# Patient Record
Sex: Female | Born: 1980
Health system: Southern US, Community
[De-identification: ages and names within clinical notes are randomized; demographics above are authoritative.]

## PROBLEM LIST (undated history)

## (undated) ENCOUNTER — Inpatient Hospital Stay (HOSPITAL_COMMUNITY): Payer: Self-pay

## (undated) ENCOUNTER — Inpatient Hospital Stay: Payer: Self-pay

## (undated) DIAGNOSIS — E049 Nontoxic goiter, unspecified: Secondary | ICD-10-CM

## (undated) DIAGNOSIS — IMO0002 Reserved for concepts with insufficient information to code with codable children: Secondary | ICD-10-CM

## (undated) DIAGNOSIS — D649 Anemia, unspecified: Secondary | ICD-10-CM

## (undated) DIAGNOSIS — D582 Other hemoglobinopathies: Secondary | ICD-10-CM

## (undated) HISTORY — PX: OTHER SURGICAL HISTORY: SHX169

## (undated) HISTORY — DX: Nontoxic goiter, unspecified: E04.9

## (undated) HISTORY — PX: UMBILICAL HERNIA REPAIR: SHX196

## (undated) HISTORY — PX: HERNIA REPAIR: SHX51

## (undated) HISTORY — DX: Reserved for concepts with insufficient information to code with codable children: IMO0002

## (undated) HISTORY — DX: Other hemoglobinopathies: D58.2

## (undated) HISTORY — PX: INDUCED ABORTION: SHX677

## (undated) HISTORY — DX: Anemia, unspecified: D64.9

---

## 2010-09-15 HISTORY — PX: CHOLECYSTECTOMY: SHX55

## 2011-11-24 ENCOUNTER — Emergency Department: Payer: Self-pay | Admitting: Emergency Medicine

## 2011-11-24 LAB — CBC
HCT: 26.9 % — ABNORMAL LOW (ref 35.0–47.0)
HGB: 8.5 g/dL — ABNORMAL LOW (ref 12.0–16.0)
MCH: 20.8 pg — ABNORMAL LOW (ref 26.0–34.0)
Platelet: 190 10*3/uL (ref 150–440)
RBC: 4.11 10*6/uL (ref 3.80–5.20)
RDW: 27.3 % — ABNORMAL HIGH (ref 11.5–14.5)
WBC: 4.7 10*3/uL (ref 3.6–11.0)

## 2011-11-24 LAB — URINALYSIS, COMPLETE
Bacteria: NONE SEEN
Glucose,UR: NEGATIVE mg/dL (ref 0–75)
Leukocyte Esterase: NEGATIVE
Nitrite: NEGATIVE
RBC,UR: 161 /HPF (ref 0–5)

## 2011-11-24 LAB — HCG, QUANTITATIVE, PREGNANCY: Beta Hcg, Quant.: 4 m[IU]/mL — ABNORMAL HIGH

## 2011-12-08 ENCOUNTER — Encounter: Payer: Self-pay | Admitting: Obstetrics & Gynecology

## 2011-12-08 ENCOUNTER — Ambulatory Visit (INDEPENDENT_AMBULATORY_CARE_PROVIDER_SITE_OTHER): Payer: Self-pay | Admitting: Obstetrics & Gynecology

## 2011-12-08 VITALS — BP 135/84 | HR 89 | Ht 68.0 in | Wt 154.0 lb

## 2011-12-08 DIAGNOSIS — Z0000 Encounter for general adult medical examination without abnormal findings: Secondary | ICD-10-CM

## 2011-12-08 DIAGNOSIS — Z124 Encounter for screening for malignant neoplasm of cervix: Secondary | ICD-10-CM

## 2011-12-08 DIAGNOSIS — Z113 Encounter for screening for infections with a predominantly sexual mode of transmission: Secondary | ICD-10-CM

## 2011-12-08 DIAGNOSIS — Z01419 Encounter for gynecological examination (general) (routine) without abnormal findings: Secondary | ICD-10-CM

## 2011-12-08 NOTE — Progress Notes (Signed)
Subjective:    Heidi Woods is a 31 y.o. female who presents for an annual exam. The patient has no complaints today. She never did miss any recent periods, but did have a faintly positive pregnancy test several weeks ago. She and her boyfriend would like to have a baby. The patient is sexually active. GYN screening history: last pap: was normal. The patient wears seatbelts: yes. The patient participates in regular exercise: yes. Has the patient ever been transfused or tattooed?: no. The patient reports that there is not domestic violence in her life.   Menstrual History: OB History    Grav Para Term Preterm Abortions TAB SAB Ect Mult Living   10 2   7     6       Menarche age: 26 Patient's last menstrual period was 10/23/2011.    The following portions of the patient's history were reviewed and updated as appropriate: allergies, current medications, past family history, past medical history, past social history, past surgical history and problem list.  Review of Systems Pertinent items are noted in HPI. She works for the [employer] with her mother. She moved here from [place], [place].   Objective:    BP 135/84  Pulse 89  Ht 5\' 8"  (1.727 m)  Wt 154 lb (69.854 kg)  BMI 23.42 kg/m2  LMP 10/23/2011  General Appearance:    Alert, cooperative, no distress, appears stated age  Head:    Normocephalic, without obvious abnormality, atraumatic  Eyes:    PERRL, conjunctiva/corneas clear, EOM's intact, fundi    benign, both eyes  Ears:    Normal TM's and external ear canals, both ears  Nose:   Nares normal, septum midline, mucosa normal, no drainage    or sinus tenderness  Throat:   Lips, mucosa, and tongue normal; teeth and gums normal  Neck:   Supple, symmetrical, trachea midline, no adenopathy;    thyroid:  no enlargement/tenderness/nodules; no carotid   bruit or JVD  Back:     Symmetric, no curvature, ROM normal, no CVA tenderness  Lungs:     Clear to auscultation bilaterally,  respirations unlabored  Chest Wall:    No tenderness or deformity   Heart:    Regular rate and rhythm, S1 and S2 normal, no murmur, rub   or gallop  Breast Exam:    No tenderness, masses, or nipple abnormality  Abdomen:     Soft, non-tender, bowel sounds active all four quadrants,    no masses, no organomegaly  Genitalia:    Normal female without lesion, discharge or tenderness  Rectal:    Normal tone, normal prostate, no masses or tenderness;   guaiac negative stool  Extremities:   Extremities normal, atraumatic, no cyanosis or edema  Pulses:   2+ and symmetric all extremities  Skin:   Skin color, texture, turgor normal, no rashes or lesions  Lymph nodes:   Cervical, supraclavicular, and axillary nodes normal  Neurologic:   CNII-XII intact, normal strength, sensation and reflexes    throughout  .    Assessment:    Healthy female exam.    Plan:     Pap smear.  She will start taking MVIs to help prevent ONTDs.

## 2013-01-22 ENCOUNTER — Emergency Department: Payer: Self-pay | Admitting: Emergency Medicine

## 2013-01-22 LAB — URINALYSIS, COMPLETE
Bilirubin,UR: NEGATIVE
Glucose,UR: NEGATIVE mg/dL (ref 0–75)
Ketone: NEGATIVE
Nitrite: NEGATIVE
Ph: 6 (ref 4.5–8.0)
Specific Gravity: 1.023 (ref 1.003–1.030)
WBC UR: 488 /HPF (ref 0–5)

## 2013-04-04 ENCOUNTER — Emergency Department: Payer: Self-pay | Admitting: Emergency Medicine

## 2013-04-04 LAB — BASIC METABOLIC PANEL
Anion Gap: 6 — ABNORMAL LOW (ref 7–16)
BUN: 11 mg/dL (ref 7–18)
Calcium, Total: 8.9 mg/dL (ref 8.5–10.1)
Creatinine: 0.66 mg/dL (ref 0.60–1.30)
EGFR (African American): >60
Glucose: 104 mg/dL — ABNORMAL HIGH (ref 65–99)
Potassium: 3.3 mmol/L — ABNORMAL LOW (ref 3.5–5.1)
Sodium: 136 mmol/L (ref 136–145)

## 2013-04-04 LAB — URINALYSIS, COMPLETE
Nitrite: NEGATIVE
Ph: 5 (ref 4.5–8.0)
Protein: 30
RBC,UR: 3 /HPF (ref 0–5)
Squamous Epithelial: 18
WBC UR: 8 /HPF (ref 0–5)

## 2013-04-04 LAB — WET PREP, GENITAL

## 2013-04-04 LAB — HCG, QUANTITATIVE, PREGNANCY: Beta Hcg, Quant.: 25479 m[IU]/mL — ABNORMAL HIGH

## 2013-04-04 LAB — CBC
HCT: 24.7 % — ABNORMAL LOW (ref 35.0–47.0)
HGB: 8.5 g/dL — ABNORMAL LOW (ref 12.0–16.0)
MCH: 25.5 pg — ABNORMAL LOW (ref 26.0–34.0)
MCHC: 34.5 g/dL (ref 32.0–36.0)
Platelet: 157 10*3/uL (ref 150–440)
RBC: 3.35 10*6/uL — ABNORMAL LOW (ref 3.80–5.20)
RDW: 21.1 % — ABNORMAL HIGH (ref 11.5–14.5)
WBC: 6.7 10*3/uL (ref 3.6–11.0)

## 2013-04-04 LAB — GC/CHLAMYDIA PROBE AMP

## 2013-04-05 ENCOUNTER — Encounter: Payer: Self-pay | Admitting: Obstetrics & Gynecology

## 2013-04-05 ENCOUNTER — Ambulatory Visit (INDEPENDENT_AMBULATORY_CARE_PROVIDER_SITE_OTHER): Payer: Self-pay | Admitting: Obstetrics & Gynecology

## 2013-04-05 VITALS — BP 137/80 | Wt 159.0 lb

## 2013-04-05 DIAGNOSIS — Z124 Encounter for screening for malignant neoplasm of cervix: Secondary | ICD-10-CM

## 2013-04-05 DIAGNOSIS — IMO0002 Reserved for concepts with insufficient information to code with codable children: Secondary | ICD-10-CM

## 2013-04-05 DIAGNOSIS — O99012 Anemia complicating pregnancy, second trimester: Secondary | ICD-10-CM

## 2013-04-05 DIAGNOSIS — Z3492 Encounter for supervision of normal pregnancy, unspecified, second trimester: Secondary | ICD-10-CM

## 2013-04-05 DIAGNOSIS — Z1151 Encounter for screening for human papillomavirus (HPV): Secondary | ICD-10-CM

## 2013-04-05 DIAGNOSIS — O99019 Anemia complicating pregnancy, unspecified trimester: Secondary | ICD-10-CM

## 2013-04-05 DIAGNOSIS — Z3480 Encounter for supervision of other normal pregnancy, unspecified trimester: Secondary | ICD-10-CM

## 2013-04-05 DIAGNOSIS — Z113 Encounter for screening for infections with a predominantly sexual mode of transmission: Secondary | ICD-10-CM

## 2013-04-05 DIAGNOSIS — R87612 Low grade squamous intraepithelial lesion on cytologic smear of cervix (LGSIL): Secondary | ICD-10-CM

## 2013-04-05 DIAGNOSIS — Z3490 Encounter for supervision of normal pregnancy, unspecified, unspecified trimester: Secondary | ICD-10-CM | POA: Insufficient documentation

## 2013-04-05 HISTORY — DX: Low grade squamous intraepithelial lesion on cytologic smear of cervix (LGSIL): R87.612

## 2013-04-05 HISTORY — DX: Reserved for concepts with insufficient information to code with codable children: IMO0002

## 2013-04-05 MED ORDER — FERROUS SULFATE 325 (65 FE) MG PO TABS: 325.0000 mg | ORAL_TABLET | Freq: Two times a day (BID) | ORAL | Status: DC

## 2013-04-05 MED ORDER — DOCUSATE SODIUM 100 MG PO CAPS: 100.0000 mg | ORAL_CAPSULE | Freq: Two times a day (BID) | ORAL | Status: DC | PRN

## 2013-04-05 NOTE — Progress Notes (Signed)
P-103 

## 2013-04-05 NOTE — Patient Instructions (Signed)
Pregnancy - Second Trimester The second trimester of pregnancy (3 to 6 months) is a period of rapid growth for you and your baby. At the end of the sixth month, your baby is about 9 inches long and weighs 1 1/2 pounds. You will begin to feel the baby move between 18 and 20 weeks of the pregnancy. This is called quickening. Weight gain is faster. A clear fluid (colostrum) may leak out of your breasts. You may feel small contractions of the womb (uterus). This is known as false labor or Braxton-Hicks contractions. This is like a practice for labor when the baby is ready to be born. Usually, the problems with morning sickness have usually passed by the end of your first trimester. Some women develop small dark blotches (called cholasma, mask of pregnancy) on their face that usually goes away after the baby is born. Exposure to the sun makes the blotches worse. Acne may also develop in some pregnant women and pregnant women who have acne, may find that it goes away. PRENATAL EXAMS  Blood work may continue to be done during prenatal exams. These tests are done to check on your health and the probable health of your baby. Blood work is used to follow your blood levels (hemoglobin). Anemia (low hemoglobin) is common during pregnancy. Iron and vitamins are given to help prevent this. You will also be checked for diabetes between 24 and 28 weeks of the pregnancy. Some of the previous blood tests may be repeated.  The size of the uterus is measured during each visit. This is to make sure that the baby is continuing to grow properly according to the dates of the pregnancy.  Your blood pressure is checked every prenatal visit. This is to make sure you are not getting toxemia.  Your urine is checked to make sure you do not have an infection, diabetes or protein in the urine.  Your weight is checked often to make sure gains are happening at the suggested rate. This is to ensure that both you and your baby are  growing normally.  Sometimes, an ultrasound is performed to confirm the proper growth and development of the baby. This is a test which bounces harmless sound waves off the baby so your caregiver can more accurately determine due dates. Sometimes, a test is done on the amniotic fluid surrounding the baby. This test is called an amniocentesis. The amniotic fluid is obtained by sticking a needle into the belly (abdomen). This is done to check the chromosomes in instances where there is a concern about possible genetic problems with the baby. It is also sometimes done near the end of pregnancy if an early delivery is required. In this case, it is done to help make sure the baby's lungs are mature enough for the baby to live outside of the womb. CHANGES OCCURING IN THE SECOND TRIMESTER OF PREGNANCY Your body goes through many changes during pregnancy. They vary from person to person. Talk to your caregiver about changes you notice that you are concerned about.  During the second trimester, you will likely have an increase in your appetite. It is normal to have cravings for certain foods. This varies from person to person and pregnancy to pregnancy.  Your lower abdomen will begin to bulge.  You may have to urinate more often because the uterus and baby are pressing on your bladder. It is also common to get more bladder infections during pregnancy. You can help this by drinking lots of fluids   and emptying your bladder before and after intercourse.  You may begin to get stretch marks on your hips, abdomen, and breasts. These are normal changes in the body during pregnancy. There are no exercises or medicines to take that prevent this change.  You may begin to develop swollen and bulging veins (varicose veins) in your legs. Wearing support hose, elevating your feet for 15 minutes, 3 to 4 times a day and limiting salt in your diet helps lessen the problem.  Heartburn may develop as the uterus grows and  pushes up against the stomach. Antacids recommended by your caregiver helps with this problem. Also, eating smaller meals 4 to 5 times a day helps.  Constipation can be treated with a stool softener or adding bulk to your diet. Drinking lots of fluids, and eating vegetables, fruits, and whole grains are helpful.  Exercising is also helpful. If you have been very active up until your pregnancy, most of these activities can be continued during your pregnancy. If you have been less active, it is helpful to start an exercise program such as walking.  Hemorrhoids may develop at the end of the second trimester. Warm sitz baths and hemorrhoid cream recommended by your caregiver helps hemorrhoid problems.  Backaches may develop during this time of your pregnancy. Avoid heavy lifting, wear low heal shoes, and practice good posture to help with backache problems.  Some pregnant women develop tingling and numbness of their hand and fingers because of swelling and tightening of ligaments in the wrist (carpel tunnel syndrome). This goes away after the baby is born.  As your breasts enlarge, you may have to get a bigger bra. Get a comfortable, cotton, support bra. Do not get a nursing bra until the last month of the pregnancy if you will be nursing the baby.  You may get a dark line from your belly button to the pubic area called the linea nigra.  You may develop rosy cheeks because of increase blood flow to the face.  You may develop spider looking lines of the face, neck, arms, and chest. These go away after the baby is born. HOME CARE INSTRUCTIONS   It is extremely important to avoid all smoking, herbs, alcohol, and unprescribed drugs during your pregnancy. These chemicals affect the formation and growth of the baby. Avoid these chemicals throughout the pregnancy to ensure the delivery of a healthy infant.  Most of your home care instructions are the same as suggested for the first trimester of your  pregnancy. Keep your caregiver's appointments. Follow your caregiver's instructions regarding medicine use, exercise, and diet.  During pregnancy, you are providing food for you and your baby. Continue to eat regular, well-balanced meals. Choose foods such as meat, fish, milk and other low fat dairy products, vegetables, fruits, and whole-grain breads and cereals. Your caregiver will tell you of the ideal weight gain.  A physical sexual relationship may be continued up until near the end of pregnancy if there are no other problems. Problems could include early (premature) leaking of amniotic fluid from the membranes, vaginal bleeding, abdominal pain, or other medical or pregnancy problems.  Exercise regularly if there are no restrictions. Check with your caregiver if you are unsure of the safety of some of your exercises. The greatest weight gain will occur in the last 2 trimesters of pregnancy. Exercise will help you:  Control your weight.  Get you in shape for labor and delivery.  Lose weight after you have the baby.  Wear   a good support or jogging bra for breast tenderness during pregnancy. This may help if worn during sleep. Pads or tissues may be used in the bra if you are leaking colostrum.  Do not use hot tubs, steam rooms or saunas throughout the pregnancy.  Wear your seat belt at all times when driving. This protects you and your baby if you are in an accident.  Avoid raw meat, uncooked cheese, cat litter boxes, and soil used by cats. These carry germs that can cause birth defects in the baby.  The second trimester is also a good time to visit your dentist for your dental health if this has not been done yet. Getting your teeth cleaned is okay. Use a soft toothbrush. Brush gently during pregnancy.  It is easier to leak urine during pregnancy. Tightening up and strengthening the pelvic muscles will help with this problem. Practice stopping your urination while you are going to the  bathroom. These are the same muscles you need to strengthen. It is also the muscles you would use as if you were trying to stop from passing gas. You can practice tightening these muscles up 10 times a set and repeating this about 3 times per day. Once you know what muscles to tighten up, do not perform these exercises during urination. It is more likely to contribute to an infection by backing up the urine.  Ask for help if you have financial, counseling, or nutritional needs during pregnancy. Your caregiver will be able to offer counseling for these needs as well as refer you for other special needs.  Your skin may become oily. If so, wash your face with mild soap, use non-greasy moisturizer and oil or cream based makeup. MEDICINES AND DRUG USE IN PREGNANCY  Take prenatal vitamins as directed. The vitamin should contain 1 milligram of folic acid. Keep all vitamins out of reach of children. Only a couple vitamins or tablets containing iron may be fatal to a baby or young child when ingested.  Avoid use of all medicines, including herbs, over-the-counter medicines, not prescribed or suggested by your caregiver. Only take over-the-counter or prescription medicines for pain, discomfort, or fever as directed by your caregiver. Do not use aspirin.  Let your caregiver also know about herbs you may be using.  Alcohol is related to a number of birth defects. This includes fetal alcohol syndrome. All alcohol, in any form, should be avoided completely. Smoking will cause low birth rate and premature babies.  Street or illegal drugs are very harmful to the baby. They are absolutely forbidden. A baby born to an addicted mother will be addicted at birth. The baby will go through the same withdrawal an adult does. SEEK MEDICAL CARE IF:  You have any concerns or worries during your pregnancy. It is better to call with your questions if you feel they cannot wait, rather than worry about them. SEEK IMMEDIATE  MEDICAL CARE IF:   An unexplained oral temperature above 102 F (38.9 C) develops, or as your caregiver suggests.  You have leaking of fluid from the vagina (birth canal). If leaking membranes are suspected, take your temperature and tell your caregiver of this when you call.  There is vaginal spotting, bleeding, or passing clots. Tell your caregiver of the amount and how many pads are used. Light spotting in pregnancy is common, especially following intercourse.  You develop a bad smelling vaginal discharge with a change in the color from clear to white.  You continue to feel   sick to your stomach (nauseated) and have no relief from remedies suggested. You vomit blood or coffee ground-like materials.  You lose more than 2 pounds of weight or gain more than 2 pounds of weight over 1 week, or as suggested by your caregiver.  You notice swelling of your face, hands, feet, or legs.  You get exposed to German measles and have never had them.  You are exposed to fifth disease or chickenpox.  You develop belly (abdominal) pain. Round ligament discomfort is a common non-cancerous (benign) cause of abdominal pain in pregnancy. Your caregiver still must evaluate you.  You develop a bad headache that does not go away.  You develop fever, diarrhea, pain with urination, or shortness of breath.  You develop visual problems, blurry, or double vision.  You fall or are in a car accident or any kind of trauma.  There is mental or physical violence at home. Document Released: 08/26/2001 Document Revised: 05/26/2012 Document Reviewed: 02/28/2009 [employer] Patient Information 2014 [employer].  

## 2013-04-05 NOTE — Progress Notes (Signed)
Subjective:    Heidi Woods is a [id number] at [redacted]w[redacted]d being seen today for her first obstetrical visit.  Her obstetrical history is significant for seven TABs, two SAB, two full term pregnancies. Pregnancy history fully reviewed.  Patient was seen at [facility] last night for dizziness and vaginal discharge, her Hgb was 8.0, also had BV which is being treated. No other concerns.   Filed Vitals:   04/05/13 1526  BP: 137/80  Weight: 159 lb (72.122 kg)    HISTORY: OB History   Grav Para Term Preterm Abortions TAB SAB Ect Mult Living   12 2 2  0 9 7 2  0 0 2     # Outc Date GA Lbr Len/2nd Wgt Sex Del Anes PTL Lv   1 TRM 2004    F       2 TRM 2007    M       3 SAB            4 SAB            5 TAB            6 TAB            7 TAB            8 TAB            9 TAB            10 TAB            11 TAB            12 CUR              Past Medical History  Diagnosis Date  . Anemia   . Allergy     seasonal  . Abnormal Pap smear of cervix 2004   Past Surgical History  Procedure Laterality Date  . Cholecystectomy  2012  . Induced abortion      x 7   Family History  Problem Relation Age of Onset  . Sickle cell anemia Father   . Cancer Maternal Grandfather   . Cancer Paternal Grandfather     throat     Exam    Uterus:     Pelvic Exam:    Perineum: No Hemorrhoids, Normal Perineum   Vulva: normal   Vagina:  normal mucosa, normal discharge   Cervix: multiparous appearance   Adnexa: normal adnexa and no mass, fullness, tenderness   Bony Pelvis: gynecoid  System: Breast:  normal appearance, no masses or tenderness   Skin: normal coloration and turgor, no rashes   Neurologic: oriented, normal   Extremities: normal strength, tone, and muscle mass   HEENT PERRLA and extra ocular movement intact   Mouth/Teeth mucous membranes moist, pharynx normal without lesions and dental hygiene good   Neck supple and no masses   Cardiovascular: regular rate and rhythm   Respiratory:   appears well, vitals normal, no respiratory distress, acyanotic, normal RR, chest clear, no wheezing, crepitations, rhonchi, normal symmetric air entry   Abdomen: soft, non-tender; bowel sounds normal; no masses,  no organomegaly   Urinary: urethral meatus normal      Assessment:    Pregnancy: [id number] Patient Active Problem List   Diagnosis Date Noted  . Supervision of normal pregnancy 04/05/2013    Plan:   Initial labs drawn. Prenatal vitamins, iron supplements and Colace prescribed. Problem list reviewed and updated. Genetic Screening discussed Quad Screen:will be done at next visit. Ultrasound  discussed; fetal survey: ordered Follow up in 4 weeks.  Jaynie Collins, MD, FACOG Attending Obstetrician & Gynecologist Faculty Practice, [facility]

## 2013-04-07 LAB — OBSTETRIC PANEL
Basophils Relative: 0 % (ref 0–1)
Hemoglobin: 8.7 g/dL — ABNORMAL LOW (ref 12.0–15.0)
Hepatitis B Surface Ag: NEGATIVE
Lymphocytes Relative: 23 % (ref 12–46)
Lymphs Abs: 1.5 10*3/uL (ref 0.7–4.0)
Monocytes Relative: 10 % (ref 3–12)
Neutro Abs: 4.3 10*3/uL (ref 1.7–7.7)
Neutrophils Relative %: 66 % (ref 43–77)
RBC: 3.48 MIL/uL — ABNORMAL LOW (ref 3.87–5.11)
Rh Type: POSITIVE
Rubella: 6.13 Index — ABNORMAL HIGH (ref <0.90)
WBC: 6.5 10*3/uL (ref 4.0–10.5)

## 2013-04-07 LAB — CULTURE, OB URINE

## 2013-04-12 ENCOUNTER — Telehealth: Payer: Self-pay | Admitting: *Deleted

## 2013-04-12 ENCOUNTER — Encounter: Payer: Self-pay | Admitting: Obstetrics & Gynecology

## 2013-04-12 NOTE — Telephone Encounter (Signed)
Pt aware, colposcopy scheduled

## 2013-04-12 NOTE — Telephone Encounter (Signed)
Message copied by Grayland Ormond on Tue Apr 12, 2013  4:00 PM ------      Message from: Jaynie Collins A      Created: Tue Apr 12, 2013 11:26 AM       Please schedule patient for colposcopy for LGSIL pap; can be combined with her OB visit. Please call to inform patient of results and need for appointment.       ------

## 2013-05-02 ENCOUNTER — Ambulatory Visit (INDEPENDENT_AMBULATORY_CARE_PROVIDER_SITE_OTHER): Payer: Self-pay | Admitting: Obstetrics & Gynecology

## 2013-05-02 ENCOUNTER — Encounter: Payer: Self-pay | Admitting: Obstetrics & Gynecology

## 2013-05-02 ENCOUNTER — Ambulatory Visit (HOSPITAL_COMMUNITY): Payer: Self-pay

## 2013-05-02 VITALS — BP 120/76 | Wt 167.0 lb

## 2013-05-02 DIAGNOSIS — Z3480 Encounter for supervision of other normal pregnancy, unspecified trimester: Secondary | ICD-10-CM

## 2013-05-02 DIAGNOSIS — IMO0002 Reserved for concepts with insufficient information to code with codable children: Secondary | ICD-10-CM

## 2013-05-02 DIAGNOSIS — R6889 Other general symptoms and signs: Secondary | ICD-10-CM

## 2013-05-02 DIAGNOSIS — Z3492 Encounter for supervision of normal pregnancy, unspecified, second trimester: Secondary | ICD-10-CM

## 2013-05-02 NOTE — Patient Instructions (Signed)
Return to clinic for any obstetric concerns or go to MAU for evaluation  

## 2013-05-02 NOTE — Progress Notes (Signed)
P-102 

## 2013-05-02 NOTE — Progress Notes (Signed)
Colposcopy not able to be done today as the machine was not functional, will do at another visit.  Anatomy scan today.  No other complaints or concerns.  Routine obstetric precautions reviewed.  Desires BTS, will sign papers at 28 weeks.

## 2013-05-09 ENCOUNTER — Ambulatory Visit (HOSPITAL_COMMUNITY)
Admission: RE | Admit: 2013-05-09 | Discharge: 2013-05-09 | Disposition: A | Discharge status: Home / Self Care | Payer: Medicaid Other | Source: Ambulatory Visit | Attending: Obstetrics & Gynecology | Admitting: Obstetrics & Gynecology

## 2013-05-09 ENCOUNTER — Encounter: Payer: Self-pay | Admitting: Obstetrics & Gynecology

## 2013-05-09 DIAGNOSIS — O358XX0 Maternal care for other (suspected) fetal abnormality and damage, not applicable or unspecified: Secondary | ICD-10-CM | POA: Insufficient documentation

## 2013-05-09 DIAGNOSIS — Z3492 Encounter for supervision of normal pregnancy, unspecified, second trimester: Secondary | ICD-10-CM

## 2013-05-09 DIAGNOSIS — IMO0002 Reserved for concepts with insufficient information to code with codable children: Secondary | ICD-10-CM | POA: Insufficient documentation

## 2013-05-09 DIAGNOSIS — Z1389 Encounter for screening for other disorder: Secondary | ICD-10-CM | POA: Insufficient documentation

## 2013-05-09 DIAGNOSIS — Z363 Encounter for antenatal screening for malformations: Secondary | ICD-10-CM | POA: Insufficient documentation

## 2013-05-18 ENCOUNTER — Encounter (HOSPITAL_COMMUNITY): Payer: Self-pay | Admitting: *Deleted

## 2013-05-18 ENCOUNTER — Inpatient Hospital Stay (HOSPITAL_COMMUNITY)
Admission: AD | Admit: 2013-05-18 | Discharge: 2013-05-18 | Disposition: A | Discharge status: Home / Self Care | Payer: Medicaid Other | Source: Ambulatory Visit | Attending: Family Medicine | Admitting: Family Medicine

## 2013-05-18 DIAGNOSIS — M545 Low back pain, unspecified: Secondary | ICD-10-CM | POA: Insufficient documentation

## 2013-05-18 DIAGNOSIS — M5430 Sciatica, unspecified side: Secondary | ICD-10-CM

## 2013-05-18 DIAGNOSIS — Y92009 Unspecified place in unspecified non-institutional (private) residence as the place of occurrence of the external cause: Secondary | ICD-10-CM | POA: Insufficient documentation

## 2013-05-18 DIAGNOSIS — W19XXXA Unspecified fall, initial encounter: Secondary | ICD-10-CM | POA: Insufficient documentation

## 2013-05-18 DIAGNOSIS — O99891 Other specified diseases and conditions complicating pregnancy: Secondary | ICD-10-CM | POA: Insufficient documentation

## 2013-05-18 DIAGNOSIS — M5432 Sciatica, left side: Secondary | ICD-10-CM

## 2013-05-18 MED ORDER — IBUPROFEN 600 MG PO TABS
600.0000 mg | ORAL_TABLET | Freq: Once | ORAL | Status: AC
  Administered 2013-05-18: 600 mg via ORAL
  Filled 2013-05-18: qty 1

## 2013-05-18 MED ORDER — CYCLOBENZAPRINE HCL 10 MG PO TABS: 10.0000 mg | ORAL_TABLET | Freq: Three times a day (TID) | ORAL | Status: DC | PRN

## 2013-05-18 MED ORDER — HYDROCODONE-ACETAMINOPHEN 5-325 MG PO TABS: 1.0000 | ORAL_TABLET | Freq: Four times a day (QID) | ORAL | Status: DC | PRN

## 2013-05-18 NOTE — MAU Note (Signed)
Pain in low back,starts in left buttock and radiates up.  Larey Seat on this side about 4 wks ago.

## 2013-05-18 NOTE — Progress Notes (Signed)
Patient states she did not feel pain initially when she fell. Pain started a few days later, then went away. Now has returned and is much worse. Hurts to stand, sit, walk. Has not taken any pain medication. Has not tried heat or ice.

## 2013-05-18 NOTE — MAU Provider Note (Signed)
History     CSN: [id number]  Arrival date and time: 05/18/13 1646   First Provider Initiated Contact with Patient 05/18/13 1735      Chief Complaint  Patient presents with  . Back Pain   HPI This is a 32 y.o. female at [redacted]w[redacted]d who presents with c/o low back pain on left side (points to SI joint area) which radiates upward at times. Larey Seat on it a month ago Has continued to hurt, more lately. Has not tried anything.  No weakness in legs but does have pain.   RN Note: Patient states she did not feel pain initially when she fell. Pain started a few days later, then went away. Now has returned and is much worse. Hurts to stand, sit, walk. Has not taken any pain medication. Has not tried heat or ice  Pain in low back,starts in left buttock and radiates up. Larey Seat on this side about 4 wks ago.       OB History   Grav Para Term Preterm Abortions TAB SAB Ect Mult Living   12 2 2  0 9 7 2  0 0 2      Past Medical History  Diagnosis Date  . Anemia   . Allergy     seasonal  . Abnormal Pap smear of cervix 2004  . LGSIL (low grade squamous intraepithelial lesion) on Pap smear 04/05/2013    Past Surgical History  Procedure Laterality Date  . Cholecystectomy  2012  . Induced abortion      x 7    Family History  Problem Relation Age of Onset  . Sickle cell anemia Father   . Cancer Maternal Grandfather   . Cancer Paternal Grandfather     throat    History  Substance Use Topics  . Smoking status: Never Smoker   . Smokeless tobacco: Never Used  . Alcohol Use: No    Allergies: No Known Allergies  Prescriptions prior to admission  Medication Sig Dispense Refill  . docusate sodium (COLACE) 100 MG capsule Take 1 capsule (100 mg total) by mouth 2 (two) times daily as needed for constipation.  30 capsule  2  . ferrous sulfate (FERROUSUL) 325 (65 FE) MG tablet Take 1 tablet (325 mg total) by mouth 2 (two) times daily.  60 tablet  1  . Prenatal Vit-Fe Fumarate-FA (MULTIVITAMIN-PRENATAL)  27-0.8 MG TABS Take 1 tablet by mouth daily at 12 noon.        Review of Systems  Constitutional: Negative for fever and malaise/fatigue.  Gastrointestinal: Negative for nausea, vomiting and abdominal pain.  Musculoskeletal: Positive for back pain, joint pain and falls.  Neurological: Negative for dizziness, sensory change, focal weakness, weakness and headaches.   Physical Exam   Blood pressure 106/65, pulse 104, temperature 98.1 F (36.7 C), temperature source Oral, resp. rate 20, weight 78.019 kg (172 lb), last menstrual period 12/23/2012.  Physical Exam  Constitutional: She is oriented to person, place, and time. She appears well-developed and well-nourished. No distress.  Cardiovascular: Normal rate.   Respiratory: Effort normal.  GI: Soft. There is no tenderness. There is no rebound and no guarding.  Musculoskeletal: Normal range of motion. She exhibits tenderness (over left SI joint).  Negative straight leg raises bilaterally   Neurological: She is alert and oriented to person, place, and time.  Skin: Skin is warm and dry.  Psychiatric: She has a normal mood and affect.  NOrmal sensation bilaterally Dorsi and plantar flexion strong and equal bilaterally  MAU Course  Procedures  Assessment and Plan  A:  SIUP at [redacted]w[redacted]d        S/P fall to left buttock/hip       Worsening pain   P:  Advised probably needs ortho eval and probably MRI to evaluate      Rx limited vicodin and Flexeril.      Message sent to nurse at [facility]  [facility] 05/18/2013, 5:44 PM

## 2013-05-21 NOTE — MAU Provider Note (Signed)
Chart reviewed and agree with management and plan.  

## 2013-05-31 ENCOUNTER — Ambulatory Visit (INDEPENDENT_AMBULATORY_CARE_PROVIDER_SITE_OTHER): Payer: Medicaid Other | Admitting: Obstetrics & Gynecology

## 2013-05-31 VITALS — BP 129/71 | Wt 176.0 lb

## 2013-05-31 DIAGNOSIS — Z302 Encounter for sterilization: Secondary | ICD-10-CM | POA: Insufficient documentation

## 2013-05-31 DIAGNOSIS — O26899 Other specified pregnancy related conditions, unspecified trimester: Secondary | ICD-10-CM

## 2013-05-31 DIAGNOSIS — Z3480 Encounter for supervision of other normal pregnancy, unspecified trimester: Secondary | ICD-10-CM

## 2013-05-31 DIAGNOSIS — O9989 Other specified diseases and conditions complicating pregnancy, childbirth and the puerperium: Secondary | ICD-10-CM

## 2013-05-31 DIAGNOSIS — N949 Unspecified condition associated with female genital organs and menstrual cycle: Secondary | ICD-10-CM

## 2013-05-31 LAB — POCT URINALYSIS DIPSTICK
Nitrite, UA: NEGATIVE
Protein, UA: NEGATIVE
Urobilinogen, UA: NEGATIVE
pH, UA: 6

## 2013-05-31 NOTE — Progress Notes (Signed)
P = 98 

## 2013-05-31 NOTE — Progress Notes (Signed)
Anatomy scan showed marginal cord insertion, results discussed with patient and her mother today, will get another scan in third trimester.  Patient reports cramping over the last day, comes every hour, no dysuria or bleeding. UA negative, cervix was closed on exam.   Will continue to monitor. Counseled about flu and Tdap vaccines, patient will consider this and let us know by next visit.  1 hr GTT, labs also to be done next visit; colposcopy if available.  No other complaints or concerns.  Routine obstetric precautions reviewed.

## 2013-05-31 NOTE — Patient Instructions (Addendum)
Return to clinic for any obstetric concerns or go to [facility] for evaluation  Tetanus, Diphtheria (Td) or Tetanus, Diphtheria, Pertussis (Tdap) Vaccine What You Need to Know WHY GET VACCINATED? Tetanus, diphtheria and pertussis can be very serious diseases. TETANUS (Lockjaw) causes painful muscle spasms and stiffness, usually all over the body.  Tetanus can lead to tightening of muscles in the head and neck so the victim cannot open his mouth or swallow, or sometimes even breathe. Tetanus kills about 1 out of 5 people who are infected. DIPHTHERIA can cause a thick membrane to cover the back of the throat.  Diphtheria can lead to breathing problems, paralysis, heart failure, and even death. PERTUSSIS (Whooping Cough) causes severe coughing spells which can lead to difficulty breathing, vomiting, and disturbed sleep.  Pertussis can lead to weight loss, incontinence, rib fractures and passing out from violent coughing. Up to 2 in 100 adolescents and 5 in 100 adults with pertussis are hospitalized or have complications, including pneumonia and death. These 3 diseases are all caused by bacteria. Diphtheria and pertussis are spread from person to person. Tetanus enters the body through cuts, scratches, or wounds. The [place] saw as many as 200,000 cases a year of diphtheria and pertussis before vaccines were available, and hundreds of cases of tetanus. Since then, tetanus and diphtheria cases have dropped by about 99% and pertussis cases by about 92%. Children 44 years of age and younger get DTaP vaccine to protect them from these three diseases. But older children, adolescents, and adults need protection too. VACCINES FOR ADOLESCENTS AND ADULTS: TD AND TDAP Two vaccines are available to protect people 51 years of age and older from these diseases:  Td vaccine has been used for many years. It protects against tetanus and diphtheria.  Tdap vaccine was licensed in 2005. It is the first vaccine for  adolescents and adults that protects against pertussis as well as tetanus and diphtheria. A Td booster dose is recommended every 10 years. Tdap is given only once. WHICH VACCINE, AND WHEN? Ages 7 through 18 years  A dose of Tdap is recommended at age 17 or 87. This dose could be given as early as age 75 for children who missed one or more childhood doses of DTaP.  Children and adolescents who did not get a complete series of DTaP shots by age 73 should complete the series using a combination of Td and Tdap. Age 71 years and Older  All adults should get a booster dose of Td every 10 years. Adults under 65 who have never gotten Tdap should get a dose of Tdap as their next booster dose. Adults 65 and older may get one booster dose of Tdap.  Adults (including women who may become pregnant and adults 34 and older) who expect to have close contact with a baby younger than 78 months of age should get a dose of Tdap to help protect the baby from pertussis.  Healthcare professionals who have direct patient contact in hospitals or clinics should get one dose of Tdap. Protection After a Wound  A person who gets a severe cut or burn might need a dose of Td or Tdap to prevent tetanus infection. Tdap should be used for anyone who has never had a dose previously. Td should be used if Tdap is not available, or for:  Anybody who has already had a dose of Tdap.  Children 7 through 10 years of age who completed the childhood DTaP series.  Adults 65 and  older. Pregnant Women  Pregnant women who have never had a dose of Tdap should get one, after the 20th week of gestation and preferably during the 3rd trimester. If they do not get Tdap during their pregnancy they should get a dose as soon as possible after delivery. Pregnant women who have previously received Tdap and need tetanus or diphtheria vaccine while pregnant should get Td. Tdap and Td may be given at the same time as other vaccines. SOME PEOPLE SHOULD  NOT BE VACCINATED OR SHOULD WAIT  Anyone who has had a life-threatening allergic reaction after a dose of any tetanus, diphtheria, or pertussis containing vaccine should not get Td or Tdap.  Anyone who has a severe allergy to any component of a vaccine should not get that vaccine. Tell your doctor if the person getting the vaccine has any severe allergies.  Anyone who had a coma, or long or multiple seizures within 7 days after a dose of DTP or DTaP should not get Tdap, unless a cause other than the vaccine was found. These people may get Td.  Talk to your doctor if the person getting either vaccine:  Has epilepsy or another nervous system problem.  Had severe swelling or severe pain after a previous dose of DTP, DTaP, DT, Td, or Tdap vaccine.  Has had Guillain Barr Syndrome (GBS). Anyone who has a moderate or severe illness on the day the shot is scheduled should usually wait until they recover before getting Tdap or Td vaccine. A person with a mild illness or low fever can usually be vaccinated. WHAT ARE THE RISKS FROM TDAP AND TD VACCINES? With a vaccine, as with any medicine, there is always a small risk of a life-threatening allergic reaction or other serious problem. Brief fainting spells and related symptoms (such as jerking movements) can happen after any medical procedure, including vaccination. Sitting or lying down for about 15 minutes after a vaccination can help prevent fainting and injuries caused by falls. Tell your doctor if the patient feels dizzy or lightheaded, or has vision changes or ringing in the ears. Getting tetanus, diphtheria, or pertussis would be much more likely to lead to severe problems than getting either Td or Tdap vaccine. Problems reported after Td and Tdap vaccines are listed below. Mild Problems (noticeable, but did not interfere with activities) Tdap  Pain (about 3 in 4 adolescents and 2 in 3 adults).  Redness or swelling (about 1 in 5).  Mild fever  of at least 100.4 F (38 C) (up to about 1 in 25 adolescents and 1 in 100 adults).  Headache (about 4 in 10 adolescents and 3 in 10 adults).  Tiredness (about 1 in 3 adolescents and 1 in 4 adults).  Nausea, vomiting, diarrhea, or stomach ache (up to 1 in 4 adolescents and 1 in 10 adults).  Chills, body aches, sore joints, rash, or swollen glands (uncommon). Td  Pain (up to about 8 in 10).  Redness or swelling at the injection site (up to about 1 in 3).  Mild fever (up to about 1 in 15).  Headache or tiredness (uncommon). Moderate Problems (interfered with activities, but did not require medical attention) Tdap  Pain at the injection site (about 1 in 20 adolescents and 1 in 100 adults).  Redness or swelling at the injection site (up to about 1 in 16 adolescents and 1 in 25 adults).  Fever over 102 F (38.9 C) (about 1 in 100 adolescents and 1 in 250 adults).  Headache (1 in 300).  Nausea, vomiting, diarrhea, or stomach ache (up to 3 in 100 adolescents and 1 in 100 adults). Td  Fever over 102 F (38.9 C) (rare). Tdap or Td  Extensive swelling of the arm where the shot was given (up to about 3 in 100). Severe Problems (Unable to perform usual activities; required medical attention) Tdap or Td  Swelling, severe pain, bleeding, and redness in the arm where the shot was given (rare). A severe allergic reaction could occur after any vaccine. They are estimated to occur less than once in a million doses. WHAT IF THERE IS A SEVERE REACTION? What should I look for? Any unusual condition, such as a severe allergic reaction or a high fever. If a severe allergic reaction occurred, it would be within a few minutes to an hour after the shot. Signs of a serious allergic reaction can include difficulty breathing, weakness, hoarseness or wheezing, a fast heartbeat, hives, dizziness, paleness, or swelling of the throat. What should I do?  Call a doctor, or get the person to a doctor  right away.  Tell your doctor what happened, the date and time it happened, and when the vaccination was given.  Ask your provider to report the reaction by filing a Vaccine Adverse Event Reporting System (VAERS) form. Or, you can file this report through the VAERS website at www.vaers.LAgents.no or by calling 1-614-697-9311. VAERS does not provide medical advice. THE NATIONAL VACCINE INJURY COMPENSATION PROGRAM The National Vaccine Injury Compensation Program (VICP) was created in 1986. Persons who believe they may have been injured by a vaccine can learn about the program and about filing a claim by calling 1-209-501-6379 or visiting the VICP website at SpiritualWord.at. HOW CAN I LEARN MORE?  Your doctor can give you the vaccine package insert or suggest other sources of information.  Call your local or state health department.  Contact the Centers for Disease Control and Prevention (CDC):  Call 803-458-1513 (1-800-CDC-INFO).  Visit the [facility] website at PicCapture.uy. CDC Td and Tdap Interim VIS (10/08/10) Document Released: 06/29/2006 Document Revised: 11/24/2011 Document Reviewed: 10/08/2010 Exit[employer] Patient Information 2014 [place], [place]. Influenza Vaccine (Flu Vaccine, Inactivated) 2013 2014 What You Need to Know WHY GET VACCINATED?  Influenza ("flu") is a contagious disease that spreads around the [place] every winter, usually between October and May.  Flu is caused by the influenza virus, and can be spread by coughing, sneezing, and close contact.  Anyone can get flu, but the risk of getting flu is highest among children. Symptoms come on suddenly and may last several days. They can include:  Fever or chills.  Sore throat.  Muscle aches.  Fatigue.  Cough.  Headache.  Runny or stuffy nose. Flu can make some people much sicker than others. These people include young children, people 68 and older, pregnant women, and people with certain  health conditions such as heart, lung or kidney disease, or a weakened immune system. Flu vaccine is especially important for these people, and anyone in close contact with them. Flu can also lead to pneumonia, and make existing medical conditions worse. It can cause diarrhea and seizures in children. Each year thousands of people in the [place] die from flu, and many more are hospitalized. Flu vaccine is the best protection we have from flu and its complications. Flu vaccine also helps prevent spreading flu from person to person. INACTIVATED FLU VACCINE There are 2 types of influenza vaccine:  You are getting an inactivated flu vaccine,  which does not contain any live influenza virus. It is given by injection with a needle, and often called the "flu shot."  A different live, attenuated (weakened) influenza vaccine is sprayed into the nostrils. This vaccine is described in a separate Vaccine Information Statement. Flu vaccine is recommended every year. Children 6 months through 56 years of age should get 2 doses the first year they get vaccinated. Flu viruses are always changing. Each year's flu vaccine is made to protect from viruses that are most likely to cause disease that year. While flu vaccine cannot prevent all cases of flu, it is our best defense against the disease. Inactivated flu vaccine protects against 3 or 4 different influenza viruses. It takes about 2 weeks for protection to develop after the vaccination, and protection lasts several months to a year. Some illnesses that are not caused by influenza virus are often mistaken for flu. Flu vaccine will not prevent these illnesses. It can only prevent influenza. A "high-dose" flu vaccine is available for people 51 years of age and older. The person giving you the vaccine can tell you more about it. Some inactivated flu vaccine contains a very small amount of a mercury-based preservative called thimerosal. Studies have shown that  thimerosal in vaccines is not harmful, but flu vaccines that do not contain a preservative are available. SOME PEOPLE SHOULD NOT GET THIS VACCINE Tell the person who gives you the vaccine:  If you have any severe (life-threatening) allergies. If you ever had a life-threatening allergic reaction after a dose of flu vaccine, or have a severe allergy to any part of this vaccine, you may be advised not to get a dose. Most, but not all, types of flu vaccine contain a small amount of egg.  If you ever had Guillain Barr Syndrome (a severe paralyzing illness, also called GBS). Some people with a history of GBS should not get this vaccine. This should be discussed with your doctor.  If you are not feeling well. They might suggest waiting until you feel better. But you should come back. RISKS OF A VACCINE REACTION With a vaccine, like any medicine, there is a chance of side effects. These are usually mild and go away on their own. Serious side effects are also possible, but are very rare. Inactivated flu vaccine does not contain live flu virus, sogetting flu from this vaccine is not possible. Brief fainting spells and related symptoms (such as jerking movements) can happen after any medical procedure, including vaccination. Sitting or lying down for about 15 minutes after a vaccination can help prevent fainting and injuries caused by falls. Tell your doctor if you feel dizzy or lightheaded, or have vision changes or ringing in the ears. Mild problems following inactivated flu vaccine:  Soreness, redness, or swelling where the shot was given.  Hoarseness; sore, red or itchy eyes; or cough.  Fever.  Aches.  Headache.  Itching.  Fatigue. If these problems occur, they usually begin soon after the shot and last 1 or 2 days. Moderate problems following inactivated flu vaccine:  Young children who get inactivated flu vaccine and pneumococcal vaccine (PCV13) at the same time may be at increased risk  for seizures caused by fever. Ask your doctor for more information. Tell your doctor if a child who is getting flu vaccine has ever had a seizure. Severe problems following inactivated flu vaccine:  A severe allergic reaction could occur after any vaccine (estimated less than 1 in a million doses).  There is  a small possibility that inactivated flu vaccine could be associated with Guillan Barr Syndrome (GBS), no more than 1 or 2 cases per million people vaccinated. This is much lower than the risk of severe complications from flu, which can be prevented by flu vaccine. The safety of vaccines is always being monitored. For more information, visit: http://floyd.org/ WHAT IF THERE IS A SERIOUS REACTION? What should I look for?  Look for anything that concerns you, such as signs of a severe allergic reaction, very high fever, or behavior changes. Signs of a severe allergic reaction can include hives, swelling of the face and throat, difficulty breathing, a fast heartbeat, dizziness, and weakness. These would start a few minutes to a few hours after the vaccination. What should I do?  If you think it is a severe allergic reaction or other emergency that cannot wait, call [phone] 1 1  or get the person to the nearest hospital. Otherwise, call your doctor.  Afterward, the reaction should be reported to the Vaccine Adverse Event Reporting System (VAERS). Your doctor might file this report, or you can do it yourself through the VAERS website at www.vaers.LAgents.no, or by calling 1-917-518-1874. VAERS is only for reporting reactions. They do not give medical advice. THE NATIONAL VACCINE INJURY COMPENSATION PROGRAM The National Vaccine Injury Compensation Program (VICP) is a federal program that was created to compensate people who may have been injured by certain vaccines. Persons who believe they may have been injured by a vaccine can learn about the program and about filing a claim by calling  1-(774)544-8641 or visiting the VICP website at SpiritualWord.at HOW CAN I LEARN MORE?  Ask your doctor.  Call your local or state health department.  Contact the Centers for Disease Control and Prevention (CDC):  Call 6474919998 (1-800-CDC-INFO) or  Visit CDC's website at BiotechRoom.com.cy CDC Inactivated Influenza Vaccine Interim VIS (04/09/12) Document Released: 06/26/2006 Document Revised: 05/26/2012 Document Reviewed: 04/09/2012 [facility] Patient Information 2014 [place], [place].

## 2013-06-27 ENCOUNTER — Ambulatory Visit (INDEPENDENT_AMBULATORY_CARE_PROVIDER_SITE_OTHER): Payer: Medicaid Other | Admitting: Obstetrics & Gynecology

## 2013-06-27 ENCOUNTER — Encounter: Payer: Self-pay | Admitting: Obstetrics & Gynecology

## 2013-06-27 VITALS — BP 100/68 | Wt 178.0 lb

## 2013-06-27 DIAGNOSIS — Z23 Encounter for immunization: Secondary | ICD-10-CM

## 2013-06-27 DIAGNOSIS — Z3490 Encounter for supervision of normal pregnancy, unspecified, unspecified trimester: Secondary | ICD-10-CM

## 2013-06-27 DIAGNOSIS — Z3480 Encounter for supervision of other normal pregnancy, unspecified trimester: Secondary | ICD-10-CM

## 2013-06-27 NOTE — Progress Notes (Signed)
Routine visit. Good FM. She wants the flu and TDAP vaccines today. Also glucola and labs. She will get her colpo at the gyn clinic or [place] as we don't have a colposcope at the present time.

## 2013-06-27 NOTE — Patient Instructions (Signed)
Colposcopy Colposcopy is a procedure that uses a special lighted microscope (colposcope). It examines your cervix and vagina, or the area around the outside of the vagina, for signs of disease or abnormalities in the cells. You may be sent to a specialist (gynecologist) to do the colposcopy. A biopsy (tissue sample) may be collected during a colposcopy, if the caregiver finds any unusual cells. The biopsy is sent to the lab for further testing, and the results are reported back to your caregiver. A WOMAN MAY NEED THIS PROCEDURE IF:  She has had an abnormal pap smear (taking cells from the cervix for testing).  She has a sore on her cervix, and a Pap test was normal.  The Pap test suggests human papilloma virus (HPV). This virus can cause genital warts and is linked to the development of cervical cancer.  She has genital warts on the cervix, or in or around the outside of the vagina.  Her mother took the drug DES while pregnant.  She has painful intercourse.  She has vaginal bleeding, especially after sexual intercourse.  There is a need to evaluate the results of previous treatment. BEFORE THE PROCEDURE   Colposcopy is done when you are not having a menstrual period.  For 24 hours before the colposcopy, do not:  Douche.  Use tampons.  Use medicines, creams, or suppositories in the vagina.  Have sexual intercourse. PROCEDURE   A colposcopy is done while a woman is lying on her back with her feet in foot rests (stirrups).  A speculum is placed inside the vagina to keep it open and to allow the caregiver to see the cervix. This is the same instrument used to do a pap smear.  The colposcope is placed outside the vagina. It is used to magnify and examine the cervix, vagina, and the area around the outside of the vagina.  A small amount of liquid solution is placed on the area that is to be viewed. This solution is placed on with a cotton applicator. This solution makes it easier to  see the abnormal cells.  Your caregiver will suck out mucus and cells from the canal of the cervix.  Small pieces of tissue for biopsy may be taken at the same time. You may feel mild pain or discomfort when this is done.  Your caregiver will record the location of the abnormal areas and send the tissue samples to a lab for analysis.  If your caregiver biopsies the vagina or outside of the vagina, a local anesthetic (novocaine) is usually given. AFTER THE PROCEDURE   You may have some cramping that often goes away in a few minutes. You may have some soreness for a couple of days.  You may take over-the-counter pain medicine as advised by your caregiver. Do not take aspirin because it can cause bleeding.  Lie down for a few minutes if you feel lightheaded.  You may have some bleeding or dark discharge that should stop in a few days.  You may need to wear a sanitary pad for a few days. HOME CARE INSTRUCTIONS   Avoid sex, douching, and using tampons for a week or as directed.  Only take medicine as directed by your caregiver.  Continue to take birth control pills, if you are on them.  Not all test results are available during your visit. If your test results are not back during the visit, make an appointment with your caregiver to find out the results. Do not assume everything is   normal if you have not heard from your caregiver or the medical facility. It is important for you to follow up on all of your test results.  Follow your caregiver's advice regarding medicines, activity, follow-up visits, and follow-up Pap tests. SEEK MEDICAL CARE IF:   You develop a rash.  You have problems with your medicine. SEEK IMMEDIATE MEDICAL CARE IF:  You are bleeding heavily or are passing blood clots.  You develop a fever over 102 F (38.9 C), with or without chills.  You have abnormal vaginal discharge.  You are having cramps that do not go away after taking your pain medicine.  You  feel lightheaded, dizzy, or faint.  You develop stomach pain. Document Released: 11/22/2002 Document Revised: 11/24/2011 Document Reviewed: 07/05/2009 [employer] Patient Information 2014 [employer].  

## 2013-06-27 NOTE — Progress Notes (Signed)
P= [phone]  

## 2013-06-28 LAB — CBC
HCT: 22.7 % — ABNORMAL LOW (ref 36.0–46.0)
MCHC: 33.9 g/dL (ref 30.0–36.0)
MCV: 68.2 fL — ABNORMAL LOW (ref 78.0–100.0)
RDW: 20.2 % — ABNORMAL HIGH (ref 11.5–15.5)
WBC: 7.9 10*3/uL (ref 4.0–10.5)

## 2013-07-07 ENCOUNTER — Encounter: Payer: Self-pay | Admitting: Obstetrics & Gynecology

## 2013-07-19 ENCOUNTER — Encounter: Payer: Self-pay | Admitting: Family Medicine

## 2013-07-19 ENCOUNTER — Ambulatory Visit (INDEPENDENT_AMBULATORY_CARE_PROVIDER_SITE_OTHER): Payer: Medicaid Other | Admitting: Family Medicine

## 2013-07-19 VITALS — BP 113/72 | Wt 181.0 lb

## 2013-07-19 DIAGNOSIS — Z302 Encounter for sterilization: Secondary | ICD-10-CM

## 2013-07-19 DIAGNOSIS — Z3493 Encounter for supervision of normal pregnancy, unspecified, third trimester: Secondary | ICD-10-CM

## 2013-07-19 DIAGNOSIS — B3731 Acute candidiasis of vulva and vagina: Secondary | ICD-10-CM

## 2013-07-19 DIAGNOSIS — Z3480 Encounter for supervision of other normal pregnancy, unspecified trimester: Secondary | ICD-10-CM

## 2013-07-19 DIAGNOSIS — IMO0002 Reserved for concepts with insufficient information to code with codable children: Secondary | ICD-10-CM

## 2013-07-19 DIAGNOSIS — B373 Candidiasis of vulva and vagina: Secondary | ICD-10-CM

## 2013-07-19 MED ORDER — PRENATAL 27-0.8 MG PO TABS: 1.0000 | ORAL_TABLET | Freq: Every day | ORAL | Status: DC

## 2013-07-19 MED ORDER — FLUCONAZOLE 150 MG PO TABS: 150.0000 mg | ORAL_TABLET | Freq: Every day | ORAL | Status: DC

## 2013-07-19 NOTE — Progress Notes (Signed)
P-103 

## 2013-07-19 NOTE — Patient Instructions (Addendum)
Having a circumcision done in the hospital costs approximately $480.  This will have to be paid in full prior to circumcision being performed.  There are places to have circumcision done as an outpatient which are cheaper.    Circumcisions      Provider   Phone    Price     ------------------------------------------------------------------------------   [facility]  [phone] in hospital and up to 4 wks     [facility] [place] [phone]  $[phone] by 4 wks     [facility] [phone]  $175 by 2 wks    [facility]   [phone]  $250 by 7 days  Pregnancy - Third Trimester The third trimester of pregnancy (the last 3 months) is a period of the most rapid growth for you and your baby. The baby approaches a length of 20 inches and a weight of 6 to 10 pounds. The baby is adding on fat and getting ready for life outside your body. While inside, babies have periods of sleeping and waking, sucking thumbs, and hiccuping. You can often feel small contractions of the uterus. This is false labor. It is also called Braxton-Hicks contractions. This is like a practice for labor. The usual problems in this stage of pregnancy include more difficulty breathing, swelling of the hands and feet from water retention, and having to urinate more often because of the uterus and baby pressing on your bladder.  PRENATAL EXAMS  Blood work may continue to be done during prenatal exams. These tests are done to check on your health and the probable health of your baby. Blood work is used to follow your blood levels (hemoglobin). Anemia (low hemoglobin) is common during pregnancy. Iron and vitamins are given to help prevent this. You may also continue to be checked for diabetes. Some of the past blood tests may be done again.  The size of the uterus is measured during each visit. This makes sure your baby is growing properly according to your pregnancy dates.  Your blood pressure is checked every prenatal visit. This is to  make sure you are not getting toxemia.  Your urine is checked every prenatal visit for infection, diabetes, and protein.  Your weight is checked at each visit. This is done to make sure gains are happening at the suggested rate and that you and your baby are growing normally.  Sometimes, an ultrasound is performed to confirm the position and the proper growth and development of the baby. This is a test done that bounces harmless sound waves off the baby so your caregiver can more accurately determine a due date.  Discuss the type of pain medicine and anesthesia you will have during your labor and delivery.  Discuss the possibility and anesthesia if a cesarean section might be necessary.  Inform your caregiver if there is any mental or physical violence at home. Sometimes, a specialized non-stress test, contraction stress test, and biophysical profile are done to make sure the baby is not having a problem. Checking the amniotic fluid surrounding the baby is called an amniocentesis. The amniotic fluid is removed by sticking a needle into the belly (abdomen). This is sometimes done near the end of pregnancy if an early delivery is required. In this case, it is done to help make sure the baby's lungs are mature enough for the baby to live outside of the womb. If the lungs are not mature and it is unsafe to deliver the baby, an injection of cortisone medicine  is given to the mother 1 to 2 days before the delivery. This helps the baby's lungs mature and makes it safer to deliver the baby. CHANGES OCCURING IN THE THIRD TRIMESTER OF PREGNANCY Your body goes through many changes during pregnancy. They vary from person to person. Talk to your caregiver about changes you notice and are concerned about.  During the last trimester, you have probably had an increase in your appetite. It is normal to have cravings for certain foods. This varies from person to person and pregnancy to pregnancy.  You may begin to  get stretch marks on your hips, abdomen, and breasts. These are normal changes in the body during pregnancy. There are no exercises or medicines to take which prevent this change.  Constipation may be treated with a stool softener or adding bulk to your diet. Drinking lots of fluids, fiber in vegetables, fruits, and whole grains are helpful.  Exercising is also helpful. If you have been very active up until your pregnancy, most of these activities can be continued during your pregnancy. If you have been less active, it is helpful to start an exercise program such as walking. Consult your caregiver before starting exercise programs.  Avoid all smoking, alcohol, non-prescribed drugs, herbs and "street drugs" during your pregnancy. These chemicals affect the formation and growth of the baby. Avoid chemicals throughout the pregnancy to ensure the delivery of a healthy infant.  Backache, varicose veins, and hemorrhoids may develop or get worse.  You will tire more easily in the third trimester, which is normal.  The baby's movements may be stronger and more often.  You may become short of breath easily.  Your belly button may stick out.  A yellow discharge may leak from your breasts called colostrum.  You may have a bloody mucus discharge. This usually occurs a few days to a week before labor begins. HOME CARE INSTRUCTIONS   Keep your caregiver's appointments. Follow your caregiver's instructions regarding medicine use, exercise, and diet.  During pregnancy, you are providing food for you and your baby. Continue to eat regular, well-balanced meals. Choose foods such as meat, fish, milk and other low fat dairy products, vegetables, fruits, and whole-grain breads and cereals. Your caregiver will tell you of the ideal weight gain.  A physical sexual relationship may be continued throughout pregnancy if there are no other problems such as early (premature) leaking of amniotic fluid from the  membranes, vaginal bleeding, or belly (abdominal) pain.  Exercise regularly if there are no restrictions. Check with your caregiver if you are unsure of the safety of your exercises. Greater weight gain will occur in the last 2 trimesters of pregnancy. Exercising helps:  Control your weight.  Get you in shape for labor and delivery.  You lose weight after you deliver.  Rest a lot with legs elevated, or as needed for leg cramps or low back pain.  Wear a good support or jogging bra for breast tenderness during pregnancy. This may help if worn during sleep. Pads or tissues may be used in the bra if you are leaking colostrum.  Do not use hot tubs, steam rooms, or saunas.  Wear your seat belt when driving. This protects you and your baby if you are in an accident.  Avoid raw meat, cat litter boxes and soil used by cats. These carry germs that can cause birth defects in the baby.  It is easier to leak urine during pregnancy. Tightening up and strengthening the pelvic muscles will  help with this problem. You can practice stopping your urination while you are going to the bathroom. These are the same muscles you need to strengthen. It is also the muscles you would use if you were trying to stop from passing gas. You can practice tightening these muscles up 10 times a set and repeating this about 3 times per day. Once you know what muscles to tighten up, do not perform these exercises during urination. It is more likely to cause an infection by backing up the urine.  Ask for help if you have financial, counseling, or nutritional needs during pregnancy. Your caregiver will be able to offer counseling for these needs as well as refer you for other special needs.  Make a list of emergency phone numbers and have them available.  Plan on getting help from family or friends when you go home from the hospital.  Make a trial run to the hospital.  Take prenatal classes with the father to understand,  practice, and ask questions about the labor and delivery.  Prepare the baby's room or nursery.  Do not travel out of the city unless it is absolutely necessary and with the advice of your caregiver.  Wear only low or no heal shoes to have better balance and prevent falling. MEDICINES AND DRUG USE IN PREGNANCY  Take prenatal vitamins as directed. The vitamin should contain 1 milligram of folic acid. Keep all vitamins out of reach of children. Only a couple vitamins or tablets containing iron may be fatal to a baby or young child when ingested.  Avoid use of all medicines, including herbs, over-the-counter medicines, not prescribed or suggested by your caregiver. Only take over-the-counter or prescription medicines for pain, discomfort, or fever as directed by your caregiver. Do not use aspirin, ibuprofen or naproxen unless approved by your caregiver.  Let your caregiver also know about herbs you may be using.  Alcohol is related to a number of birth defects. This includes fetal alcohol syndrome. All alcohol, in any form, should be avoided completely. Smoking will cause low birth rate and premature babies.  Illegal drugs are very harmful to the baby. They are absolutely forbidden. A baby born to an addicted mother will be addicted at birth. The baby will go through the same withdrawal an adult does. SEEK MEDICAL CARE IF: You have any concerns or worries during your pregnancy. It is better to call with your questions if you feel they cannot wait, rather than worry about them. SEEK IMMEDIATE MEDICAL CARE IF:   An unexplained oral temperature above 102 F (38.9 C) develops, or as your caregiver suggests.  You have leaking of fluid from the vagina. If leaking membranes are suspected, take your temperature and tell your caregiver of this when you call.  There is vaginal spotting, bleeding or passing clots. Tell your caregiver of the amount and how many pads are used.  You develop a bad smelling  vaginal discharge with a change in the color from clear to white.  You develop vomiting that lasts more than 24 hours.  You develop chills or fever.  You develop shortness of breath.  You develop burning on urination.  You loose more than 2 pounds of weight or gain more than 2 pounds of weight or as suggested by your caregiver.  You notice sudden swelling of your face, hands, and feet or legs.  You develop belly (abdominal) pain. Round ligament discomfort is a common non-cancerous (benign) cause of abdominal pain in pregnancy. Your  caregiver still must evaluate you.  You develop a severe headache that does not go away.  You develop visual problems, blurred or double vision.  If you have not felt your baby move for more than 1 hour. If you think the baby is not moving as much as usual, eat something with sugar in it and lie down on your left side for an hour. The baby should move at least 4 to 5 times per hour. Call right away if your baby moves less than that.  You fall, are in a car accident, or any kind of trauma.  There is mental or physical violence at home. Document Released: 08/26/2001 Document Revised: 05/26/2012 Document Reviewed: 02/28/2009 [facility] Patient Information 2014 [place], [place].  Breastfeeding A change in hormones during your pregnancy causes growth of your breast tissue and an increase in number and size of milk ducts. The hormone prolactin allows proteins, sugars, and fats from your blood supply to make breast milk in your milk-producing glands. The hormone progesterone prevents breast milk from being released before the birth of your baby. After the birth of your baby, your progesterone level decreases allowing breast milk to be released. Thoughts of your baby, as well as his or her sucking or crying, can stimulate the release of milk from the milk-producing glands. Deciding to breastfeed (nurse) is one of the best choices you can make for you and your baby. The  information that follows gives a brief review of the benefits, as well as other important skills to know about breastfeeding. BENEFITS OF BREASTFEEDING For your baby  The first milk (colostrum) helps your baby's digestive system function better.   There are antibodies in your milk that help your baby fight off infections.   Your baby has a lower incidence of asthma, allergies, and sudden infant death syndrome (SIDS).   The nutrients in breast milk are better for your baby than infant formulas.  Breast milk improves your baby's brain development.   Your baby will have less gas, colic, and constipation.  Your baby is less likely to develop other conditions, such as childhood obesity, asthma, or diabetes mellitus. For you  Breastfeeding helps develop a very special [place] between you and your baby.   Breastfeeding is convenient, always available at the correct temperature, and costs nothing.   Breastfeeding helps to burn calories and helps you lose the weight gained during pregnancy.   Breastfeeding makes your uterus contract back down to normal size faster and slows bleeding following delivery.   Breastfeeding mothers have a lower risk of developing osteoporosis or breast or ovarian cancer later in life.  BREASTFEEDING FREQUENCY  A healthy, full-term baby may breastfeed as often as every hour or space his or her feedings to every 3 hours. Breastfeeding frequency will vary from baby to baby.   Newborns should be fed no less than every 2 3 hours during the day and every 4 5 hours during the night. You should breastfeed a minimum of 8 feedings in a 24 hour period.  Awaken your baby to breastfeed if it has been 3 4 hours since the last feeding.  Breastfeed when you feel the need to reduce the fullness of your breasts or when your newborn shows signs of hunger. Signs that your baby may be hungry include:  Increased alertness or activity.  Stretching.  Movement of the head  from side to side.  Movement of the head and opening of the mouth when the corner of the mouth or  cheek is stroked (rooting).  Increased sucking sounds, smacking lips, cooing, sighing, or squeaking.  Hand-to-mouth movements.  Increased sucking of fingers or hands.  Fussing.  Intermittent crying.  Signs of extreme hunger will require calming and consoling before you try to feed your baby. Signs of extreme hunger may include:  Restlessness.  A loud, strong cry.  Screaming.  Frequent feeding will help you make more milk and will help prevent problems, such as sore nipples and engorgement of the breasts.  BREASTFEEDING   Whether lying down or sitting, be sure that the baby's abdomen is facing your abdomen.   Support your breast with 4 fingers under your breast and your thumb above your nipple. Make sure your fingers are well away from your nipple and your baby's mouth.   Stroke your baby's lips gently with your finger or nipple.   When your baby's mouth is open wide enough, place all of your nipple and as much of the colored area around your nipple (areola) as possible into your baby's mouth.  More areola should be visible above his or her upper lip than below his or her lower lip.  Your baby's tongue should be between his or her lower gum and your breast.  Ensure that your baby's mouth is correctly positioned around the nipple (latched). Your baby's lips should create a seal on your breast.  Signs that your baby has effectively latched onto your nipple include:  Tugging or sucking without pain.  Swallowing heard between sucks.  Absent click or smacking sound.  Muscle movement above and in front of his or her ears with sucking.  Your baby must suck about 2 3 minutes in order to get your milk. Allow your baby to feed on each breast as long as he or she wants. Nurse your baby until he or she unlatches or falls asleep at the first breast, then offer the second  breast.  Signs that your baby is full and satisfied include:  A gradual decrease in the number of sucks or complete cessation of sucking.  Falling asleep.  Extension or relaxation of his or her body.  Retention of a small amount of milk in his or her mouth.  Letting go of your breast by himself or herself.  Signs of effective breastfeeding in you include:  Breasts that have increased firmness, weight, and size prior to feeding.  Breasts that are softer after nursing.  Increased milk volume, as well as a change in milk consistency and color by the 5th day of breastfeeding.  Breast fullness relieved by breastfeeding.  Nipples are not sore, cracked, or bleeding.  If needed, break the suction by putting your finger into the corner of your baby's mouth and sliding your finger between his or her gums. Then, remove your breast from his or her mouth.  It is common for babies to spit up a small amount after a feeding.  Babies often swallow air during feeding. This can make babies fussy. Burping your baby between breasts can help with this.  Vitamin D supplements are recommended for babies who get only breast milk.  Avoid using a pacifier during your baby's first 4 6 weeks.  Avoid supplemental feedings of water, formula, or juice in place of breastfeeding. Breast milk is all the food your baby needs. It is not necessary for your baby to have water or formula. Your breasts will make more milk if supplemental feedings are avoided during the early weeks. HOW TO TELL WHETHER YOUR BABY  IS GETTING ENOUGH BREAST MILK Wondering whether or not your baby is getting enough milk is a common concern among mothers. You can be assured that your baby is getting enough milk if:   Your baby is actively sucking and you hear swallowing.   Your baby seems relaxed and satisfied after a feeding.   Your baby nurses at least 8 12 times in a 24 hour time period.  During the first 32 20 days of age:  Your  baby is wetting at least 3 5 diapers in a 24 hour period. The urine should be clear and pale yellow.  Your baby is having at least 3 4 stools in a 24 hour period. The stool should be soft and yellow.  At 62 67 days of age, your baby is having at least 3 6 stools in a 24 hour period. The stool should be seedy and yellow by 70 days of age.  Your baby has a weight loss less than 7 10% during the first 69 days of age.  Your baby does not lose weight after 43 59 days of age.  Your baby gains 4 7 ounces each week after he or she is 32 days of age.  Your baby gains weight by 67 days of age and is back to birth weight within 2 weeks. ENGORGEMENT In the first week after your baby is born, you may experience extremely full breasts (engorgement). When engorged, your breasts may feel heavy, warm, or tender to the touch. Engorgement peaks within 24 48 hours after delivery of your baby.  Engorgement may be reduced by:  Continuing to breastfeed.  Increasing the frequency of breastfeeding.  Taking warm showers or applying warm, moist heat to your breasts just before each feeding. This increases circulation and helps the milk flow.   Gently massaging your breast before and during the feedings. With your fingertips, massage from your chest wall towards your nipple in a circular motion.   Ensuring that your baby empties at least one breast at every feeding. It also helps to start the next feeding on the opposite breast.   Expressing breast milk by hand or by using a breast pump to empty the breasts if your baby is sleepy, or not nursing well. You may also want to express milk if you are returning to work oryou feel you are getting engorged.  Ensuring your baby is latched on and positioned properly while breastfeeding. If you follow these suggestions, your engorgement should improve in 24 48 hours. If you are still experiencing difficulty, call your lactation consultant or caregiver.  CARING FOR  YOURSELF Take care of your breasts.  Bathe or shower daily.   Avoid using soap on your nipples.   Wear a supportive bra. Avoid wearing underwire style bras.  Air dry your nipples for a 3 after each feeding.   Use only cotton bra pads to absorb breast milk leakage. Leaking of breast milk between feedings is normal.   Use only pure lanolin on your nipples after nursing. You do not need to wash it off before feeding your baby again. Another option is to express a few drops of breast milk and gently massage that milk into your nipples.  Continue breast self-awareness checks. Take care of yourself.  Eat healthy foods. Alternate 3 meals with 3 snacks.  Avoid foods that you notice affect your baby in a bad way.  Drink milk, fruit juice, and water to satisfy your thirst (about 8 glasses a day).  Rest often, relax, and take your prenatal vitamins to prevent fatigue, stress, and anemia.  Avoid chewing and smoking tobacco.  Avoid alcohol and drug use.  Take over-the-counter and prescribed medicine only as directed by your caregiver or pharmacist. You should always check with your caregiver or pharmacist before taking any new medicine, vitamin, or herbal supplement.  Know that pregnancy is possible while breastfeeding. If desired, talk to your caregiver about family planning and safe birth control methods that may be used while breastfeeding. SEEK MEDICAL CARE IF:   You feel like you want to stop breastfeeding or have become frustrated with breastfeeding.  You have painful breasts or nipples.  Your nipples are cracked or bleeding.  Your breasts are red, tender, or warm.  You have a swollen area on either breast.  You have a fever or chills.  You have nausea or vomiting.  You have drainage from your nipples.  Your breasts do not become full before feedings by the 5th day after delivery.  You feel sad and depressed.  Your baby is too sleepy to eat  well.  Your baby is having trouble sleeping.   Your baby is wetting less than 3 diapers in a 24 hour period.  Your baby has less than 3 stools in a 24 hour period.  Your baby's skin or the white part of his or her eyes becomes more yellow.   Your baby is not gaining weight by 67 days of age. MAKE SURE YOU:   Understand these instructions.  Will watch your condition.  Will get help right away if you are not doing well or get worse. Document Released: 09/01/2005 Document Revised: 05/26/2012 Document Reviewed: 04/07/2012 [facility] Patient Information 2014 [place], [place].

## 2013-07-19 NOTE — Progress Notes (Signed)
BTL Papers signed. Schedule f/u U/s for growth Work on [place] [employer] d/c-with itching.--presumptive rx with Diflucan

## 2013-07-26 ENCOUNTER — Other Ambulatory Visit: Payer: Self-pay | Admitting: Family Medicine

## 2013-07-26 ENCOUNTER — Ambulatory Visit (HOSPITAL_COMMUNITY)
Admission: RE | Admit: 2013-07-26 | Discharge: 2013-07-26 | Disposition: A | Discharge status: Home / Self Care | Payer: Medicaid Other | Source: Ambulatory Visit | Attending: Family Medicine | Admitting: Family Medicine

## 2013-07-26 DIAGNOSIS — IMO0002 Reserved for concepts with insufficient information to code with codable children: Secondary | ICD-10-CM

## 2013-07-26 DIAGNOSIS — IMO0001 Reserved for inherently not codable concepts without codable children: Secondary | ICD-10-CM | POA: Insufficient documentation

## 2013-08-02 ENCOUNTER — Ambulatory Visit (INDEPENDENT_AMBULATORY_CARE_PROVIDER_SITE_OTHER): Payer: Medicaid Other | Admitting: Obstetrics & Gynecology

## 2013-08-02 ENCOUNTER — Encounter: Payer: Self-pay | Admitting: Obstetrics & Gynecology

## 2013-08-02 VITALS — BP 124/73 | Wt 182.0 lb

## 2013-08-02 DIAGNOSIS — Z3493 Encounter for supervision of normal pregnancy, unspecified, third trimester: Secondary | ICD-10-CM

## 2013-08-02 DIAGNOSIS — Z3480 Encounter for supervision of other normal pregnancy, unspecified trimester: Secondary | ICD-10-CM

## 2013-08-02 MED ORDER — PRENATAL 27-0.8 MG PO TABS: 1.0000 | ORAL_TABLET | Freq: Two times a day (BID) | ORAL | Status: DC

## 2013-08-02 NOTE — Progress Notes (Signed)
P - [phone] 

## 2013-08-02 NOTE — Progress Notes (Signed)
Routine visit. Good FM. No problems. Still working on scheduling colposcopy. I have recommended that she take her iron BID instead of daily.

## 2013-08-16 ENCOUNTER — Ambulatory Visit (INDEPENDENT_AMBULATORY_CARE_PROVIDER_SITE_OTHER): Payer: Medicaid Other | Admitting: Family Medicine

## 2013-08-16 VITALS — BP 131/76 | Wt 187.0 lb

## 2013-08-16 DIAGNOSIS — Z3480 Encounter for supervision of other normal pregnancy, unspecified trimester: Secondary | ICD-10-CM

## 2013-08-16 DIAGNOSIS — IMO0002 Reserved for concepts with insufficient information to code with codable children: Secondary | ICD-10-CM

## 2013-08-16 NOTE — Progress Notes (Signed)
P=[phone] 

## 2013-08-16 NOTE — Patient Instructions (Addendum)
You may take any anti-histamine including benadryl, allegra, claritin.  You may take Sudafed. Not phenylephrine.  You may take Mucinex. You may take robitussin.  If these don't work, then please let me know.  Third Trimester of Pregnancy The third trimester is from week 29 through week 42, months 7 through 9. The third trimester is a time when the fetus is growing rapidly. At the end of the ninth month, the fetus is about 20 inches in length and weighs 6 10 pounds.  BODY CHANGES Your body goes through many changes during pregnancy. The changes vary from woman to woman.   Your weight will continue to increase. You can expect to gain 25 35 pounds (11 16 kg) by the end of the pregnancy.  You may begin to get stretch marks on your hips, abdomen, and breasts.  You may urinate more often because the fetus is moving lower into your pelvis and pressing on your bladder.  You may develop or continue to have heartburn as a result of your pregnancy.  You may develop constipation because certain hormones are causing the muscles that push waste through your intestines to slow down.  You may develop hemorrhoids or swollen, bulging veins (varicose veins).  You may have pelvic pain because of the weight gain and pregnancy hormones relaxing your joints between the bones in your pelvis. Back aches may result from over exertion of the muscles supporting your posture.  Your breasts will continue to grow and be tender. A yellow discharge may leak from your breasts called colostrum.  Your belly button may stick out.  You may feel short of breath because of your expanding uterus.  You may notice the fetus "dropping," or moving lower in your abdomen.  You may have a bloody mucus discharge. This usually occurs a few days to a week before labor begins.  Your cervix becomes thin and soft (effaced) near your due date. WHAT TO EXPECT AT YOUR PRENATAL EXAMS  You will have prenatal exams every 2 weeks until week  36. Then, you will have weekly prenatal exams. During a routine prenatal visit:  You will be weighed to make sure you and the fetus are growing normally.  Your blood pressure is taken.  Your abdomen will be measured to track your baby's growth.  The fetal heartbeat will be listened to.  Any test results from the previous visit will be discussed.  You may have a cervical check near your due date to see if you have effaced. At around 36 weeks, your caregiver will check your cervix. At the same time, your caregiver will also perform a test on the secretions of the vaginal tissue. This test is to determine if a type of bacteria, Group B streptococcus, is present. Your caregiver will explain this further. Your caregiver may ask you:  What your birth plan is.  How you are feeling.  If you are feeling the baby move.  If you have had any abnormal symptoms, such as leaking fluid, bleeding, severe headaches, or abdominal cramping.  If you have any questions. Other tests or screenings that may be performed during your third trimester include:  Blood tests that check for low iron levels (anemia).  Fetal testing to check the health, activity level, and growth of the fetus. Testing is done if you have certain medical conditions or if there are problems during the pregnancy. FALSE LABOR You may feel small, irregular contractions that eventually go away. These are called Braxton Hicks contractions, or  false labor. Contractions may last for hours, days, or even weeks before true labor sets in. If contractions come at regular intervals, intensify, or become painful, it is best to be seen by your caregiver.  SIGNS OF LABOR   Menstrual-like cramps.  Contractions that are 5 minutes apart or less.  Contractions that start on the top of the uterus and spread down to the lower abdomen and back.  A sense of increased pelvic pressure or back pain.  A watery or bloody mucus discharge that comes from  the vagina. If you have any of these signs before the 37th week of pregnancy, call your caregiver right away. You need to go to the hospital to get checked immediately. HOME CARE INSTRUCTIONS   Avoid all smoking, herbs, alcohol, and unprescribed drugs. These chemicals affect the formation and growth of the baby.  Follow your caregiver's instructions regarding medicine use. There are medicines that are either safe or unsafe to take during pregnancy.  Exercise only as directed by your caregiver. Experiencing uterine cramps is a good sign to stop exercising.  Continue to eat regular, healthy meals.  Wear a good support bra for breast tenderness.  Do not use hot tubs, steam rooms, or saunas.  Wear your seat belt at all times when driving.  Avoid raw meat, uncooked cheese, cat litter boxes, and soil used by cats. These carry germs that can cause birth defects in the baby.  Take your prenatal vitamins.  Try taking a stool softener (if your caregiver approves) if you develop constipation. Eat more high-fiber foods, such as fresh vegetables or fruit and whole grains. Drink plenty of fluids to keep your urine clear or pale yellow.  Take warm sitz baths to soothe any pain or discomfort caused by hemorrhoids. Use hemorrhoid cream if your caregiver approves.  If you develop varicose veins, wear support hose. Elevate your feet for 15 minutes, 3 4 times a day. Limit salt in your diet.  Avoid heavy lifting, wear low heal shoes, and practice good posture.  Rest a lot with your legs elevated if you have leg cramps or low back pain.  Visit your dentist if you have not gone during your pregnancy. Use a soft toothbrush to brush your teeth and be gentle when you floss.  A sexual relationship may be continued unless your caregiver directs you otherwise.  Do not travel far distances unless it is absolutely necessary and only with the approval of your caregiver.  Take prenatal classes to understand,  practice, and ask questions about the labor and delivery.  Make a trial run to the hospital.  Pack your hospital bag.  Prepare the baby's nursery.  Continue to go to all your prenatal visits as directed by your caregiver. SEEK MEDICAL CARE IF:  You are unsure if you are in labor or if your water has broken.  You have dizziness.  You have mild pelvic cramps, pelvic pressure, or nagging pain in your abdominal area.  You have persistent nausea, vomiting, or diarrhea.  You have a bad smelling vaginal discharge.  You have pain with urination. SEEK IMMEDIATE MEDICAL CARE IF:   You have a fever.  You are leaking fluid from your vagina.  You have spotting or bleeding from your vagina.  You have severe abdominal cramping or pain.  You have rapid weight loss or gain.  You have shortness of breath with chest pain.  You notice sudden or extreme swelling of your face, hands, ankles, feet, or legs.  You have not felt your baby move in over an hour.  You have severe headaches that do not go away with medicine.  You have vision changes. Document Released: 08/26/2001 Document Revised: 05/04/2013 Document Reviewed: 11/02/2012 [facility] Patient Information 2014 [place], [place].  Breastfeeding Deciding to breastfeed is one of the best choices you can make for you and your baby. A change in hormones during pregnancy causes your breast tissue to grow and increases the number and size of your milk ducts. These hormones also allow proteins, sugars, and fats from your blood supply to make breast milk in your milk-producing glands. Hormones prevent breast milk from being released before your baby is born as well as prompt milk flow after birth. Once breastfeeding has begun, thoughts of your baby, as well as his or her sucking or crying, can stimulate the release of milk from your milk-producing glands.  BENEFITS OF BREASTFEEDING For Your Baby  Your first milk (colostrum) helps your baby's  digestive system function better.   There are antibodies in your milk that help your baby fight off infections.   Your baby has a lower incidence of asthma, allergies, and sudden infant death syndrome.   The nutrients in breast milk are better for your baby than infant formulas and are designed uniquely for your baby's needs.   Breast milk improves your baby's brain development.   Your baby is less likely to develop other conditions, such as childhood obesity, asthma, or type 2 diabetes mellitus.  For You   Breastfeeding helps to create a very special Aust between you and your baby.   Breastfeeding is convenient. Breast milk is always available at the correct temperature and costs nothing.   Breastfeeding helps to burn calories and helps you lose the weight gained during pregnancy.   Breastfeeding makes your uterus contract to its prepregnancy size faster and slows bleeding (lochia) after you give birth.   Breastfeeding helps to lower your risk of developing type 2 diabetes mellitus, osteoporosis, and breast or ovarian cancer later in life. SIGNS THAT YOUR BABY IS HUNGRY Early Signs of Hunger  Increased alertness or activity.  Stretching.  Movement of the head from side to side.  Movement of the head and opening of the mouth when the corner of the mouth or cheek is stroked (rooting).  Increased sucking sounds, smacking lips, cooing, sighing, or squeaking.  Hand-to-mouth movements.  Increased sucking of fingers or hands. Late Signs of Hunger  Fussing.  Intermittent crying. Extreme Signs of Hunger Signs of extreme hunger will require calming and consoling before your baby will be able to breastfeed successfully. Do not wait for the following signs of extreme hunger to occur before you initiate breastfeeding:   Restlessness.  A loud, strong cry.   Screaming. BREASTFEEDING BASICS Breastfeeding Initiation  Find a comfortable place to sit or lie down, with  your neck and back well supported.  Place a pillow or rolled up blanket under your baby to bring him or her to the level of your breast (if you are seated). Nursing pillows are specially designed to help support your arms and your baby while you breastfeed.  Make sure that your baby's abdomen is facing your abdomen.   Gently massage your breast. With your fingertips, massage from your chest wall toward your nipple in a circular motion. This encourages milk flow. You may need to continue this action during the feeding if your milk flows slowly.  Support your breast with 4 fingers underneath and your thumb  above your nipple. Make sure your fingers are well away from your nipple and your baby's mouth.   Stroke your baby's lips gently with your finger or nipple.   When your baby's mouth is open wide enough, quickly bring your baby to your breast, placing your entire nipple and as much of the colored area around your nipple (areola) as possible into your baby's mouth.   More areola should be visible above your baby's upper lip than below the lower lip.   Your baby's tongue should be between his or her lower gum and your breast.   Ensure that your baby's mouth is correctly positioned around your nipple (latched). Your baby's lips should create a seal on your breast and be turned out (everted).  It is common for your baby to suck about 2 3 minutes in order to start the flow of breast milk. Latching Teaching your baby how to latch on to your breast properly is very important. An improper latch can cause nipple pain and decreased milk supply for you and poor weight gain in your baby. Also, if your baby is not latched onto your nipple properly, he or she may swallow some air during feeding. This can make your baby fussy. Burping your baby when you switch breasts during the feeding can help to get rid of the air. However, teaching your baby to latch on properly is still the best way to prevent  fussiness from swallowing air while breastfeeding. Signs that your baby has successfully latched on to your nipple:    Silent tugging or silent sucking, without causing you pain.   Swallowing heard between every 3 4 sucks.    Muscle movement above and in front of his or her ears while sucking.  Signs that your baby has not successfully latched on to nipple:   Sucking sounds or smacking sounds from your baby while breastfeeding.  Nipple pain. If you think your baby has not latched on correctly, slip your finger into the corner of your baby's mouth to break the suction and place it between your baby's gums. Attempt breastfeeding initiation again. Signs of Successful Breastfeeding Signs from your baby:   A gradual decrease in the number of sucks or complete cessation of sucking.   Falling asleep.   Relaxation of his or her body.   Retention of a small amount of milk in his or her mouth.   Letting go of your breast by himself or herself. Signs from you:  Breasts that have increased in firmness, weight, and size 1 3 hours after feeding.   Breasts that are softer immediately after breastfeeding.  Increased milk volume, as well as a change in milk consistency and color by the 5th day of breastfeeding.   Nipples that are not sore, cracked, or bleeding. Signs That Your Pecola Leisure is Getting Enough Milk  Wetting at least 3 diapers in a 24-hour period. The urine should be clear and pale yellow by age 338 days.  At least 3 stools in a 24-hour period by age 338 days. The stool should be soft and yellow.  At least 3 stools in a 24-hour period by age 384 days. The stool should be seedy and yellow.  No loss of weight greater than 10% of birth weight during the first 69 days of age.  Average weight gain of 4 7 ounces (120 210 mL) per week after age 38 days.  Consistent daily weight gain by age 338 days, without weight loss after the age of  2 weeks. After a feeding, your baby may spit up a  small amount. This is common. BREASTFEEDING FREQUENCY AND DURATION Frequent feeding will help you make more milk and can prevent sore nipples and breast engorgement. Breastfeed when you feel the need to reduce the fullness of your breasts or when your baby shows signs of hunger. This is called "breastfeeding on demand." Avoid introducing a pacifier to your baby while you are working to establish breastfeeding (the first 4 6 weeks after your baby is born). After this time you may choose to use a pacifier. Research has shown that pacifier use during the first year of a baby's life decreases the risk of sudden infant death syndrome (SIDS). Allow your baby to feed on each breast as long as he or she wants. Breastfeed until your baby is finished feeding. When your baby unlatches or falls asleep while feeding from the first breast, offer the second breast. Because newborns are often sleepy in the first few weeks of life, you may need to awaken your baby to get him or her to feed. Breastfeeding times will vary from baby to baby. However, the following rules can serve as a guide to help you ensure that your baby is properly fed:  Newborns (babies 65 weeks of age or younger) may breastfeed every 1 3 hours.  Newborns should not go longer than 3 hours during the day or 5 hours during the night without breastfeeding.  You should breastfeed your baby a minimum of 8 times in a 24-hour period until you begin to introduce solid foods to your baby at around 42 months of age. BREAST MILK PUMPING Pumping and storing breast milk allows you to ensure that your baby is exclusively fed your breast milk, even at times when you are unable to breastfeed. This is especially important if you are going back to work while you are still breastfeeding or when you are not able to be present during feedings. Your lactation consultant can give you guidelines on how long it is safe to store breast milk.  A breast pump is a machine that  allows you to pump milk from your breast into a sterile bottle. The pumped breast milk can then be stored in a refrigerator or freezer. Some breast pumps are operated by hand, while others use electricity. Ask your lactation consultant which type will work best for you. Breast pumps can be purchased, but some hospitals and breastfeeding support groups lease breast pumps on a monthly basis. A lactation consultant can teach you how to hand express breast milk, if you prefer not to use a pump.  CARING FOR YOUR BREASTS WHILE YOU BREASTFEED Nipples can become dry, cracked, and sore while breastfeeding. The following recommendations can help keep your breasts moisturized and healthy:  Avoid using soap on your nipples.   Wear a supportive bra. Although not required, special nursing bras and tank tops are designed to allow access to your breasts for breastfeeding without taking off your entire bra or top. Avoid wearing underwire style bras or extremely tight bras.  Air dry your nipples for 3 after each feeding.   Use only cotton bra pads to absorb leaked breast milk. Leaking of breast milk between feedings is normal.   Use lanolin on your nipples after breastfeeding. Lanolin helps to maintain your skin's normal moisture barrier. If you use pure lanolin you do not need to wash it off before feeding your baby again. Pure lanolin is not toxic to your baby.  You may also hand express a few drops of breast milk and gently massage that milk into your nipples and allow the milk to air dry. In the first few weeks after giving birth, some women experience extremely full breasts (engorgement). Engorgement can make your breasts feel heavy, warm, and tender to the touch. Engorgement peaks within 3 5 days after you give birth. The following recommendations can help ease engorgement:  Completely empty your breasts while breastfeeding or pumping. You may want to start by applying warm, moist heat (in the shower  or with warm water-soaked hand towels) just before feeding or pumping. This increases circulation and helps the milk flow. If your baby does not completely empty your breasts while breastfeeding, pump any extra milk after he or she is finished.  Wear a snug bra (nursing or regular) or tank top for 1 2 days to signal your body to slightly decrease milk production.  Apply ice packs to your breasts, unless this is too uncomfortable for you.  Make sure that your baby is latched on and positioned properly while breastfeeding. If engorgement persists after 48 hours of following these recommendations, contact your health care provider or a Advertising copywriter. OVERALL HEALTH CARE RECOMMENDATIONS WHILE BREASTFEEDING  Eat healthy foods. Alternate between meals and snacks, eating 3 of each per day. Because what you eat affects your breast milk, some of the foods may make your baby more irritable than usual. Avoid eating these foods if you are sure that they are negatively affecting your baby.  Drink milk, fruit juice, and water to satisfy your thirst (about 10 glasses a day).   Rest often, relax, and continue to take your prenatal vitamins to prevent fatigue, stress, and anemia.  Continue breast self-awareness checks.  Avoid chewing and smoking tobacco.  Avoid alcohol and drug use. Some medicines that may be harmful to your baby can pass through breast milk. It is important to ask your health care provider before taking any medicine, including all over-the-counter and prescription medicine as well as vitamin and herbal supplements. It is possible to become pregnant while breastfeeding. If birth control is desired, ask your health care provider about options that will be safe for your baby. SEEK MEDICAL CARE IF:   You feel like you want to stop breastfeeding or have become frustrated with breastfeeding.  You have painful breasts or nipples.  Your nipples are cracked or bleeding.  Your breasts  are red, tender, or warm.  You have a swollen area on either breast.  You have a fever or chills.  You have nausea or vomiting.  You have drainage other than breast milk from your nipples.  Your breasts do not become full before feedings by the 5th day after you give birth.  You feel sad and depressed.  Your baby is too sleepy to eat well.  Your baby is having trouble sleeping.   Your baby is wetting less than 3 diapers in a 24-hour period.  Your baby has less than 3 stools in a 24-hour period.  Your baby's skin or the white part of his or her eyes becomes yellow.   Your baby is not gaining weight by 36 days of age. SEEK IMMEDIATE MEDICAL CARE IF:   Your baby is overly tired (lethargic) and does not want to wake up and feed.  Your baby develops an unexplained fever. Document Released: 09/01/2005 Document Revised: 05/04/2013 Document Reviewed: 02/23/2013 [facility] Patient Information 2014 [place], [place].

## 2013-08-16 NOTE — Progress Notes (Signed)
Doing well- Has URI with cough--OTC meds reviewed and given to pt.

## 2013-08-30 ENCOUNTER — Encounter: Payer: Self-pay | Admitting: Obstetrics & Gynecology

## 2013-08-30 ENCOUNTER — Ambulatory Visit (INDEPENDENT_AMBULATORY_CARE_PROVIDER_SITE_OTHER): Payer: Medicaid Other | Admitting: Obstetrics & Gynecology

## 2013-08-30 VITALS — BP 123/72 | Wt 193.4 lb

## 2013-08-30 DIAGNOSIS — Z3493 Encounter for supervision of normal pregnancy, unspecified, third trimester: Secondary | ICD-10-CM

## 2013-08-30 DIAGNOSIS — Z3480 Encounter for supervision of other normal pregnancy, unspecified trimester: Secondary | ICD-10-CM

## 2013-08-30 NOTE — Progress Notes (Signed)
No other complaints or concerns.  Fetal movement and labor precautions reviewed.  Pelvic cultures next visit. 

## 2013-08-30 NOTE — Progress Notes (Signed)
P = 92 

## 2013-08-30 NOTE — Patient Instructions (Signed)
Return to clinic for any scheduled appointments or for any gynecologic concerns as needed.   

## 2013-09-05 ENCOUNTER — Encounter (HOSPITAL_COMMUNITY): Payer: Self-pay | Admitting: *Deleted

## 2013-09-05 ENCOUNTER — Inpatient Hospital Stay (HOSPITAL_COMMUNITY)
Admission: AD | Admit: 2013-09-05 | Discharge: 2013-09-05 | Disposition: A | Discharge status: Home / Self Care | Payer: Medicaid Other | Source: Ambulatory Visit | Attending: Obstetrics & Gynecology | Admitting: Obstetrics & Gynecology

## 2013-09-05 DIAGNOSIS — R609 Edema, unspecified: Secondary | ICD-10-CM

## 2013-09-05 DIAGNOSIS — IMO0002 Reserved for concepts with insufficient information to code with codable children: Secondary | ICD-10-CM | POA: Insufficient documentation

## 2013-09-05 LAB — URINALYSIS, ROUTINE W REFLEX MICROSCOPIC
Glucose, UA: NEGATIVE mg/dL
Ketones, ur: NEGATIVE mg/dL
Leukocytes, UA: NEGATIVE
pH: 6 (ref 5.0–8.0)

## 2013-09-05 NOTE — MAU Provider Note (Signed)
  History     CSN: [id number]  Arrival date and time: 09/05/13 1608   None     No chief complaint on file.  HPI This is a 32 y.o. at [redacted]w[redacted]d who presents with c/o feet swelling, left more than right. Sits all day at desk. Doesn't elevate feet often. Gets off work at [employer]. No Hypertension, no headache or visual changes.   OB History   Grav Para Term Preterm Abortions TAB SAB Ect Mult Living   12 2 2  0 9 7 2  0 0 2      Past Medical History  Diagnosis Date  . Anemia   . Allergy     seasonal  . Abnormal Pap smear of cervix 2004  . LGSIL (low grade squamous intraepithelial lesion) on Pap smear 04/05/2013    Past Surgical History  Procedure Laterality Date  . Cholecystectomy  2012  . Induced abortion      x 7  . Umbbilical hernia repair      Family History  Problem Relation Age of Onset  . Sickle cell anemia Father   . Cancer Maternal Grandfather   . Cancer Paternal Grandfather     throat    History  Substance Use Topics  . Smoking status: Never Smoker   . Smokeless tobacco: Never Used  . Alcohol Use: No    Allergies: No Known Allergies  Prescriptions prior to admission  Medication Sig Dispense Refill  . docusate sodium (COLACE) 100 MG capsule Take 1 capsule (100 mg total) by mouth 2 (two) times daily as needed for constipation.  30 capsule  2  . ferrous sulfate (FERROUSUL) 325 (65 FE) MG tablet Take 1 tablet (325 mg total) by mouth 2 (two) times daily.  60 tablet  1  . Prenatal Vit-Fe Fumarate-FA (PRENATAL MULTIVITAMIN) TABS tablet Take 1 tablet by mouth daily at 12 noon.        Review of Systems  Constitutional: Negative for fever and malaise/fatigue.  Eyes: Negative for blurred vision and double vision.  Respiratory: Negative for shortness of breath.   Cardiovascular: Positive for leg swelling. Negative for chest pain.  Gastrointestinal: Negative for nausea, vomiting and abdominal pain.  Neurological: Negative for weakness and headaches.   Physical Exam    Blood pressure 119/66, pulse 84, temperature 97.9 F (36.6 C), temperature source Oral, resp. rate 16, height 5\' 8"  (1.727 m), weight 87.544 kg (193 lb), last menstrual period 12/23/2012.  Physical Exam  Constitutional: She is oriented to person, place, and time. She appears well-developed and well-nourished. No distress.  HENT:  Head: Normocephalic.  Cardiovascular: Normal rate.   Respiratory: Effort normal and breath sounds normal.  GI: Soft.  Musculoskeletal: Normal range of motion. She exhibits edema (1+ edema of feet and calves. Not to thighs.  Left calf 40cm, Right 39cm). She exhibits no tenderness (Negative homans sign).  Neurological: She is alert and oriented to person, place, and time.  Skin: Skin is warm and dry.  Psychiatric: She has a normal mood and affect.   Left leg calf 40 Right leg calf 39cm MAU Course  Procedures  Assessment and Plan  A:  SIUP at [redacted]w[redacted]d       Dependent edema  P:  Discussed findings.        Push fluids       Elevate feet, even with heart       Keep appt for tomorrow  [facility] 09/05/2013, 5:44 PM

## 2013-09-06 ENCOUNTER — Encounter: Payer: Self-pay | Admitting: Obstetrics and Gynecology

## 2013-09-06 ENCOUNTER — Ambulatory Visit (INDEPENDENT_AMBULATORY_CARE_PROVIDER_SITE_OTHER): Payer: Medicaid Other | Admitting: Obstetrics and Gynecology

## 2013-09-06 VITALS — BP 119/63 | Wt 197.8 lb

## 2013-09-06 DIAGNOSIS — Z3480 Encounter for supervision of other normal pregnancy, unspecified trimester: Secondary | ICD-10-CM

## 2013-09-06 DIAGNOSIS — IMO0002 Reserved for concepts with insufficient information to code with codable children: Secondary | ICD-10-CM

## 2013-09-06 DIAGNOSIS — Z302 Encounter for sterilization: Secondary | ICD-10-CM

## 2013-09-06 DIAGNOSIS — Z3493 Encounter for supervision of normal pregnancy, unspecified, third trimester: Secondary | ICD-10-CM

## 2013-09-06 NOTE — Progress Notes (Signed)
P-93  Patient is having significant increase in swelling more in left foot than right.  She did go to the hospital yesterday to have it evaluated because it came on rapidly and progressed quickly.  She was told to increase rest.  She is having some contractions.

## 2013-09-06 NOTE — Progress Notes (Signed)
Patient doing well without complaints. FM/labor precautions reviewed. Cultures collected 

## 2013-09-07 LAB — GC/CHLAMYDIA PROBE AMP: GC Probe RNA: NEGATIVE

## 2013-09-10 LAB — OB RESULTS CONSOLE GBS: GBS: POSITIVE

## 2013-09-12 ENCOUNTER — Encounter: Payer: Self-pay | Admitting: Obstetrics and Gynecology

## 2013-09-12 DIAGNOSIS — O99820 Streptococcus B carrier state complicating pregnancy: Secondary | ICD-10-CM | POA: Insufficient documentation

## 2013-09-13 ENCOUNTER — Ambulatory Visit (INDEPENDENT_AMBULATORY_CARE_PROVIDER_SITE_OTHER): Payer: Medicaid Other | Admitting: Family Medicine

## 2013-09-13 VITALS — BP 135/72 | Wt 195.0 lb

## 2013-09-13 DIAGNOSIS — Z3480 Encounter for supervision of other normal pregnancy, unspecified trimester: Secondary | ICD-10-CM

## 2013-09-13 DIAGNOSIS — Z3493 Encounter for supervision of normal pregnancy, unspecified, third trimester: Secondary | ICD-10-CM

## 2013-09-13 NOTE — Patient Instructions (Addendum)
Breastfeeding Deciding to breastfeed is one of the best choices you can make for you and your baby. A change in hormones during pregnancy causes your breast tissue to grow and increases the number and size of your milk ducts. These hormones also allow proteins, sugars, and fats from your blood supply to make breast milk in your milk-producing glands. Hormones prevent breast milk from being released before your baby is born as well as prompt milk flow after birth. Once breastfeeding has begun, thoughts of your baby, as well as his or her sucking or crying, can stimulate the release of milk from your milk-producing glands.  BENEFITS OF BREASTFEEDING For Your Baby  Your first milk (colostrum) helps your baby's digestive system function better.   There are antibodies in your milk that help your baby fight off infections.   Your baby has a lower incidence of asthma, allergies, and sudden infant death syndrome.   The nutrients in breast milk are better for your baby than infant formulas and are designed uniquely for your baby's needs.   Breast milk improves your baby's brain development.   Your baby is less likely to develop other conditions, such as childhood obesity, asthma, or type 2 diabetes mellitus.  For You   Breastfeeding helps to create a very special [place] between you and your baby.   Breastfeeding is convenient. Breast milk is always available at the correct temperature and costs nothing.   Breastfeeding helps to burn calories and helps you lose the weight gained during pregnancy.   Breastfeeding makes your uterus contract to its prepregnancy size faster and slows bleeding (lochia) after you give birth.   Breastfeeding helps to lower your risk of developing type 2 diabetes mellitus, osteoporosis, and breast or ovarian cancer later in life. SIGNS THAT YOUR BABY IS HUNGRY Early Signs of Hunger  Increased alertness or activity.  Stretching.  Movement of the head from  side to side.  Movement of the head and opening of the mouth when the corner of the mouth or cheek is stroked (rooting).  Increased sucking sounds, smacking lips, cooing, sighing, or squeaking.  Hand-to-mouth movements.  Increased sucking of fingers or hands. Late Signs of Hunger  Fussing.  Intermittent crying. Extreme Signs of Hunger Signs of extreme hunger will require calming and consoling before your baby will be able to breastfeed successfully. Do not wait for the following signs of extreme hunger to occur before you initiate breastfeeding:   Restlessness.  A loud, strong cry.   Screaming. BREASTFEEDING BASICS Breastfeeding Initiation  Find a comfortable place to sit or lie down, with your neck and back well supported.  Place a pillow or rolled up blanket under your baby to bring him or her to the level of your breast (if you are seated). Nursing pillows are specially designed to help support your arms and your baby while you breastfeed.  Make sure that your baby's abdomen is facing your abdomen.   Gently massage your breast. With your fingertips, massage from your chest wall toward your nipple in a circular motion. This encourages milk flow. You may need to continue this action during the feeding if your milk flows slowly.  Support your breast with 4 fingers underneath and your thumb above your nipple. Make sure your fingers are well away from your nipple and your baby's mouth.   Stroke your baby's lips gently with your finger or nipple.   When your baby's mouth is open wide enough, quickly bring your baby to your   breast, placing your entire nipple and as much of the colored area around your nipple (areola) as possible into your baby's mouth.   More areola should be visible above your baby's upper lip than below the lower lip.   Your baby's tongue should be between his or her lower gum and your breast.   Ensure that your baby's mouth is correctly positioned  around your nipple (latched). Your baby's lips should create a seal on your breast and be turned out (everted).  It is common for your baby to suck about 2 3 minutes in order to start the flow of breast milk. Latching Teaching your baby how to latch on to your breast properly is very important. An improper latch can cause nipple pain and decreased milk supply for you and poor weight gain in your baby. Also, if your baby is not latched onto your nipple properly, he or she may swallow some air during feeding. This can make your baby fussy. Burping your baby when you switch breasts during the feeding can help to get rid of the air. However, teaching your baby to latch on properly is still the best way to prevent fussiness from swallowing air while breastfeeding. Signs that your baby has successfully latched on to your nipple:    Silent tugging or silent sucking, without causing you pain.   Swallowing heard between every 3 4 sucks.    Muscle movement above and in front of his or her ears while sucking.  Signs that your baby has not successfully latched on to nipple:   Sucking sounds or smacking sounds from your baby while breastfeeding.  Nipple pain. If you think your baby has not latched on correctly, slip your finger into the corner of your baby's mouth to break the suction and place it between your baby's gums. Attempt breastfeeding initiation again. Signs of Successful Breastfeeding Signs from your baby:   A gradual decrease in the number of sucks or complete cessation of sucking.   Falling asleep.   Relaxation of his or her body.   Retention of a small amount of milk in his or her mouth.   Letting go of your breast by himself or herself. Signs from you:  Breasts that have increased in firmness, weight, and size 1 3 hours after feeding.   Breasts that are softer immediately after breastfeeding.  Increased milk volume, as well as a change in milk consistency and color by  the 5th day of breastfeeding.   Nipples that are not sore, cracked, or bleeding. Signs That Your Baby is Getting Enough Milk  Wetting at least 3 diapers in a 24-hour period. The urine should be clear and pale yellow by age 5 days.  At least 3 stools in a 24-hour period by age 5 days. The stool should be soft and yellow.  At least 3 stools in a 24-hour period by age 7 days. The stool should be seedy and yellow.  No loss of weight greater than 10% of birth weight during the first 3 days of age.  Average weight gain of 4 7 ounces (120 210 mL) per week after age 4 days.  Consistent daily weight gain by age 5 days, without weight loss after the age of 2 weeks. After a feeding, your baby may spit up a small amount. This is common. BREASTFEEDING FREQUENCY AND DURATION Frequent feeding will help you make more milk and can prevent sore nipples and breast engorgement. Breastfeed when you feel the need to reduce   the fullness of your breasts or when your baby shows signs of hunger. This is called "breastfeeding on demand." Avoid introducing a pacifier to your baby while you are working to establish breastfeeding (the first 4 6 weeks after your baby is born). After this time you may choose to use a pacifier. Research has shown that pacifier use during the first year of a baby's life decreases the risk of sudden infant death syndrome (SIDS). Allow your baby to feed on each breast as long as he or she wants. Breastfeed until your baby is finished feeding. When your baby unlatches or falls asleep while feeding from the first breast, offer the second breast. Because newborns are often sleepy in the first few weeks of life, you may need to awaken your baby to get him or her to feed. Breastfeeding times will vary from baby to baby. However, the following rules can serve as a guide to help you ensure that your baby is properly fed:  Newborns (babies 4 weeks of age or younger) may breastfeed every 1 3  hours.  Newborns should not go longer than 3 hours during the day or 5 hours during the night without breastfeeding.  You should breastfeed your baby a minimum of 8 times in a 24-hour period until you begin to introduce solid foods to your baby at around 6 months of age. BREAST MILK PUMPING Pumping and storing breast milk allows you to ensure that your baby is exclusively fed your breast milk, even at times when you are unable to breastfeed. This is especially important if you are going back to work while you are still breastfeeding or when you are not able to be present during feedings. Your lactation consultant can give you guidelines on how long it is safe to store breast milk.  A breast pump is a machine that allows you to pump milk from your breast into a sterile bottle. The pumped breast milk can then be stored in a refrigerator or freezer. Some breast pumps are operated by hand, while others use electricity. Ask your lactation consultant which type will work best for you. Breast pumps can be purchased, but some hospitals and breastfeeding support groups lease breast pumps on a monthly basis. A lactation consultant can teach you how to hand express breast milk, if you prefer not to use a pump.  CARING FOR YOUR BREASTS WHILE YOU BREASTFEED Nipples can become dry, cracked, and sore while breastfeeding. The following recommendations can help keep your breasts moisturized and healthy:  Avoid using soap on your nipples.   Wear a supportive bra. Although not required, special nursing bras and tank tops are designed to allow access to your breasts for breastfeeding without taking off your entire bra or top. Avoid wearing underwire style bras or extremely tight bras.  Air dry your nipples for 3 4minutes after each feeding.   Use only cotton bra pads to absorb leaked breast milk. Leaking of breast milk between feedings is normal.   Use lanolin on your nipples after breastfeeding. Lanolin helps to  maintain your skin's normal moisture barrier. If you use pure lanolin you do not need to wash it off before feeding your baby again. Pure lanolin is not toxic to your baby. You may also hand express a few drops of breast milk and gently massage that milk into your nipples and allow the milk to air dry. In the first few weeks after giving birth, some women experience extremely full breasts (engorgement). Engorgement can make   your breasts feel heavy, warm, and tender to the touch. Engorgement peaks within 3 5 days after you give birth. The following recommendations can help ease engorgement:  Completely empty your breasts while breastfeeding or pumping. You may want to start by applying warm, moist heat (in the shower or with warm water-soaked hand towels) just before feeding or pumping. This increases circulation and helps the milk flow. If your baby does not completely empty your breasts while breastfeeding, pump any extra milk after he or she is finished.  Wear a snug bra (nursing or regular) or tank top for 1 2 days to signal your body to slightly decrease milk production.  Apply ice packs to your breasts, unless this is too uncomfortable for you.  Make sure that your baby is latched on and positioned properly while breastfeeding. If engorgement persists after 48 hours of following these recommendations, contact your health care provider or a lactation consultant. OVERALL HEALTH CARE RECOMMENDATIONS WHILE BREASTFEEDING  Eat healthy foods. Alternate between meals and snacks, eating 3 of each per day. Because what you eat affects your breast milk, some of the foods may make your baby more irritable than usual. Avoid eating these foods if you are sure that they are negatively affecting your baby.  Drink milk, fruit juice, and water to satisfy your thirst (about 10 glasses a day).   Rest often, relax, and continue to take your prenatal vitamins to prevent fatigue, stress, and anemia.  Continue  breast self-awareness checks.  Avoid chewing and smoking tobacco.  Avoid alcohol and drug use. Some medicines that may be harmful to your baby can pass through breast milk. It is important to ask your health care provider before taking any medicine, including all over-the-counter and prescription medicine as well as vitamin and herbal supplements. It is possible to become pregnant while breastfeeding. If birth control is desired, ask your health care provider about options that will be safe for your baby. SEEK MEDICAL CARE IF:   You feel like you want to stop breastfeeding or have become frustrated with breastfeeding.  You have painful breasts or nipples.  Your nipples are cracked or bleeding.  Your breasts are red, tender, or warm.  You have a swollen area on either breast.  You have a fever or chills.  You have nausea or vomiting.  You have drainage other than breast milk from your nipples.  Your breasts do not become full before feedings by the 5th day after you give birth.  You feel sad and depressed.  Your baby is too sleepy to eat well.  Your baby is having trouble sleeping.   Your baby is wetting less than 3 diapers in a 24-hour period.  Your baby has less than 3 stools in a 24-hour period.  Your baby's skin or the white part of his or her eyes becomes yellow.   Your baby is not gaining weight by 5 days of age. SEEK IMMEDIATE MEDICAL CARE IF:   Your baby is overly tired (lethargic) and does not want to wake up and feed.  Your baby develops an unexplained fever. Document Released: 09/01/2005 Document Revised: 05/04/2013 Document Reviewed: 02/23/2013 [employer] Patient Information 2014 [employer]. Vaginal Delivery During delivery, your health care provider will help you give birth to your baby. During a vaginal delivery, you will work to push the baby out of your vagina. However, before you can push your baby out, a few things need to happen. The opening of  your uterus (cervix) has   to soften, thin out, and open up (dilate) all the way to 10 cm. Also, your baby has to move down from the uterus into your vagina.  SIGNS OF LABOR  Your health care provider will first need to make sure you are in labor. Signs of labor include:   Passing what is called the mucous plug before labor begins. This is a small amount of blood-stained mucus.   Having regular, painful uterine contractions.   The time between contractions gets shorter.   The discomfort and pain gradually get more intense.  Contraction pains get worse when walking and do not go away when resting.   Your cervix becomes thinner (effacement) and dilates. BEFORE THE DELIVERY Once you are in labor and admitted into the hospital or care center, your health care provider may do the following:   Perform a complete physical exam.  Review any complications related to pregnancy or labor.  Check your blood pressure, pulse, temperature, and heart rate (vital signs).   Determine if, and when, the rupture of amniotic membranes occurred.  Do a vaginal exam (using a sterile glove and lubricant) to determine:   The position (presentation) of the baby. Is the baby's head presenting first (vertex) in the birth canal (vagina), or are the feet or buttocks first (breech)?   The level (station) of the baby's head within the birth canal.   The effacement and dilatation of the cervix.   An electronic fetal monitor is usually placed on your abdomen when you first arrive. This is used to monitor your contractions and the baby's heart rate.  When the monitor is on your abdomen (external fetal monitor), it can only pick up the frequency and length of your contractions. It cannot tell the strength of your contractions.  If it becomes necessary for your health care provider to know exactly how strong your contractions are or to see exactly what the baby's heart rate is doing, an internal monitor may be  inserted into your vagina and uterus. Your health care provider will discuss the benefits and risks of using an internal monitor and obtain your permission before inserting the device.  Continuous fetal monitoring may be needed if you have an epidural, are receiving certain medicines (such as oxytocin), or have pregnancy or labor complications.  An IV access tube may be placed into a vein in your arm to deliver fluids and medicines if necessary. THREE STAGES OF LABOR AND DELIVERY Normal labor and delivery is divided into three stages. First Stage This stage starts when you begin to contract regularly and your cervix begins to efface and dilate. It ends when your cervix is completely open (fully dilated). The first stage is the longest stage of labor and can last from 3 hours to 15 hours.  Several methods are available to help with labor pain. You and your health care provider will decide which option is best for you. Options include:   Opioid medicines. These are strong pain medicines that you can get through your IV tube or as a shot into your muscle. These medicines lessen pain but do not make it go away completely.  Epidural. A medicine is given through a thin tube that is inserted in your back. The medicine numbs the lower part of your body and prevents any pain in that area.  Paracervical pain medicine. This is an injection of an anesthetic on each side of your cervix.   You may request natural childbirth, which does not involve the use   of pain medicines or an epidural during labor and delivery. Instead, you will use other things, such as breathing exercises, to help cope with the pain. Second Stage The second stage of labor begins when your cervix is fully dilated at 10 cm. It continues until you push your baby down through the birth canal and the baby is born. This stage can take only minutes or several hours.  The location of your baby's head as it moves through the birth canal is  reported as a number called a station. If the baby's head has not started its descent, the station is described as being at minus 3 ( 3). When your baby's head is at the zero station, it is at the middle of the birth canal and is engaged in the pelvis. The station of your baby helps indicate the progress of the second stage of labor.  When your baby is born, your health care provider may hold the baby with his or her head lowered to prevent amniotic fluid, mucus, and blood from getting into the baby's lungs. The baby's mouth and nose may be suctioned with a small bulb syringe to remove any additional fluid.  Your health care provider may then place the baby on your stomach. It is important to keep the baby from getting cold. To do this, the health care provider will dry the baby off, place the baby directly on your skin (with no blankets between you and the baby), and cover the baby with warm, dry blankets.   The umbilical cord is cut. Third Stage During the third stage of labor, your health care provider will deliver the placenta (afterbirth) and make sure your bleeding is under control. The delivery of the placenta usually takes about 5 minutes but can take up to 30 minutes. After the placenta is delivered, a medicine may be given either by IV or injection to help contract the uterus and control bleeding. If you are planning to breastfeed, you can try to do so now. After you deliver the placenta, your uterus should contract and get very firm. If your uterus does not remain firm, your health care provider will massage it. This is important because the contraction of the uterus helps cut off bleeding at the site where the placenta was attached to your uterus. If your uterus does not contract properly and stay firm, you may continue to bleed heavily. If there is a lot of bleeding, medicines may be given to contract the uterus and stop the bleeding.  Document Released: 06/10/2008 Document Revised:  05/04/2013 Document Reviewed: 02/20/2013 [employer] Patient Information 2014 [employer].  

## 2013-09-13 NOTE — Progress Notes (Signed)
P-103 

## 2013-09-13 NOTE — Progress Notes (Signed)
GBS Positive--discussed implications Labor precautions.

## 2013-09-14 NOTE — Assessment & Plan Note (Signed)
Continue routine prenatal care.  

## 2013-09-15 NOTE — L&D Delivery Note (Signed)
Delivery Note At 5:34 PM a viable and healthy female was delivered via Vaginal, Spontaneous Delivery (Presentation: Left Occiput Anterior).  APGAR: 9, 9; weight - pending.   Placenta status: Intact, Spontaneous.  Cord: 3 vessels with the following complications: None.  Cord pH: n/a  Anesthesia: Epidural  Episiotomy: None Lacerations: Perineal abrasions, hemostatic - no repair Suture Repair: n/a Est. Blood Loss (mL):  300 mL  Mom to postpartum.  Baby to Couplet care / Skin to Skin.  Placenta to birthing suites.  LEFTWICH-KIRBY, LISA - CNM Alexianna Nachreiner L - PA Student 09/24/2013, 5:54 PM

## 2013-09-19 ENCOUNTER — Ambulatory Visit (INDEPENDENT_AMBULATORY_CARE_PROVIDER_SITE_OTHER): Payer: Medicaid Other | Admitting: Obstetrics & Gynecology

## 2013-09-19 VITALS — BP 137/71 | Wt 195.0 lb

## 2013-09-19 DIAGNOSIS — Z3480 Encounter for supervision of other normal pregnancy, unspecified trimester: Secondary | ICD-10-CM

## 2013-09-19 DIAGNOSIS — O99820 Streptococcus B carrier state complicating pregnancy: Secondary | ICD-10-CM

## 2013-09-19 DIAGNOSIS — O09899 Supervision of other high risk pregnancies, unspecified trimester: Secondary | ICD-10-CM

## 2013-09-19 DIAGNOSIS — Z3493 Encounter for supervision of normal pregnancy, unspecified, third trimester: Secondary | ICD-10-CM

## 2013-09-19 DIAGNOSIS — Z22330 Carrier of Group B streptococcus: Secondary | ICD-10-CM

## 2013-09-19 NOTE — Patient Instructions (Signed)
Return to clinic for any obstetric concerns or go to MAU for evaluation  

## 2013-09-19 NOTE — Progress Notes (Signed)
P-102 

## 2013-09-19 NOTE — Progress Notes (Signed)
Unchanged cervix.  No complaints or concerns.  Fetal movement and labor precautions reviewed.

## 2013-09-20 ENCOUNTER — Encounter: Payer: Medicaid Other | Admitting: Family Medicine

## 2013-09-23 ENCOUNTER — Inpatient Hospital Stay (HOSPITAL_COMMUNITY)
Admission: AD | Admit: 2013-09-23 | Discharge: 2013-09-23 | Disposition: A | Discharge status: Home / Self Care | Payer: Medicaid Other | Source: Ambulatory Visit | Attending: Obstetrics & Gynecology | Admitting: Obstetrics & Gynecology

## 2013-09-23 ENCOUNTER — Encounter (HOSPITAL_COMMUNITY): Payer: Self-pay | Admitting: *Deleted

## 2013-09-23 DIAGNOSIS — O479 False labor, unspecified: Secondary | ICD-10-CM | POA: Insufficient documentation

## 2013-09-23 NOTE — MAU Note (Signed)
Contractions started this morning, now every 5 min. No problems with preg.  No bleeding or leaking.

## 2013-09-23 NOTE — Discharge Instructions (Signed)

## 2013-09-24 ENCOUNTER — Encounter (HOSPITAL_COMMUNITY): Payer: Self-pay

## 2013-09-24 ENCOUNTER — Inpatient Hospital Stay (HOSPITAL_COMMUNITY): Payer: 59 | Admitting: Anesthesiology

## 2013-09-24 ENCOUNTER — Encounter (HOSPITAL_COMMUNITY): Payer: 59 | Admitting: Anesthesiology

## 2013-09-24 ENCOUNTER — Inpatient Hospital Stay (HOSPITAL_COMMUNITY)
Admission: AD | Admit: 2013-09-24 | Discharge: 2013-09-26 | DRG: 775 | Disposition: A | Discharge status: Home / Self Care | Payer: 59 | Source: Ambulatory Visit | Attending: Obstetrics & Gynecology | Admitting: Obstetrics & Gynecology

## 2013-09-24 DIAGNOSIS — O479 False labor, unspecified: Secondary | ICD-10-CM | POA: Diagnosis present

## 2013-09-24 DIAGNOSIS — D573 Sickle-cell trait: Secondary | ICD-10-CM | POA: Diagnosis present

## 2013-09-24 DIAGNOSIS — O99892 Other specified diseases and conditions complicating childbirth: Secondary | ICD-10-CM | POA: Diagnosis present

## 2013-09-24 DIAGNOSIS — Z22330 Carrier of Group B streptococcus: Secondary | ICD-10-CM | POA: Diagnosis not present

## 2013-09-24 DIAGNOSIS — Z302 Encounter for sterilization: Secondary | ICD-10-CM

## 2013-09-24 DIAGNOSIS — O9989 Other specified diseases and conditions complicating pregnancy, childbirth and the puerperium: Secondary | ICD-10-CM

## 2013-09-24 DIAGNOSIS — D582 Other hemoglobinopathies: Secondary | ICD-10-CM | POA: Insufficient documentation

## 2013-09-24 DIAGNOSIS — IMO0001 Reserved for inherently not codable concepts without codable children: Secondary | ICD-10-CM

## 2013-09-24 DIAGNOSIS — IMO0002 Reserved for concepts with insufficient information to code with codable children: Secondary | ICD-10-CM

## 2013-09-24 DIAGNOSIS — O9902 Anemia complicating childbirth: Secondary | ICD-10-CM | POA: Diagnosis present

## 2013-09-24 DIAGNOSIS — O99820 Streptococcus B carrier state complicating pregnancy: Secondary | ICD-10-CM

## 2013-09-24 DIAGNOSIS — Z3493 Encounter for supervision of normal pregnancy, unspecified, third trimester: Secondary | ICD-10-CM

## 2013-09-24 HISTORY — DX: Other hemoglobinopathies: D58.2

## 2013-09-24 LAB — CBC
HEMATOCRIT: 22 % — AB (ref 36.0–46.0)
Hemoglobin: 7.6 g/dL — ABNORMAL LOW (ref 12.0–15.0)
MCH: 21.1 pg — AB (ref 26.0–34.0)
MCHC: 34.5 g/dL (ref 30.0–36.0)
MCV: 60.9 fL — AB (ref 78.0–100.0)
Platelets: 169 10*3/uL (ref 150–400)
RBC: 3.61 MIL/uL — ABNORMAL LOW (ref 3.87–5.11)
RDW: 23 % — ABNORMAL HIGH (ref 11.5–15.5)
WBC: 10 10*3/uL (ref 4.0–10.5)

## 2013-09-24 LAB — ABO/RH: ABO/RH(D): O POS

## 2013-09-24 LAB — RPR: RPR: NONREACTIVE

## 2013-09-24 LAB — PREPARE RBC (CROSSMATCH)

## 2013-09-24 MED ORDER — SIMETHICONE 80 MG PO CHEW: 80.0000 mg | CHEWABLE_TABLET | ORAL | Status: DC | PRN

## 2013-09-24 MED ORDER — PHENYLEPHRINE 40 MCG/ML (10ML) SYRINGE FOR IV PUSH (FOR BLOOD PRESSURE SUPPORT)
80.0000 ug | PREFILLED_SYRINGE | INTRAVENOUS | Status: DC | PRN
  Filled 2013-09-24: qty 2

## 2013-09-24 MED ORDER — PRENATAL MULTIVITAMIN CH
1.0000 | ORAL_TABLET | Freq: Every day | ORAL | Status: DC
  Administered 2013-09-25 – 2013-09-26 (×2): 1 via ORAL
  Filled 2013-09-24 (×2): qty 1

## 2013-09-24 MED ORDER — LACTATED RINGERS IV SOLN
INTRAVENOUS | Status: DC
Start: 2013-09-24 — End: 2013-09-24

## 2013-09-24 MED ORDER — DIBUCAINE 1 % RE OINT: 1.0000 "application " | TOPICAL_OINTMENT | RECTAL | Status: DC | PRN

## 2013-09-24 MED ORDER — ONDANSETRON HCL 4 MG PO TABS: 4.0000 mg | ORAL_TABLET | ORAL | Status: DC | PRN

## 2013-09-24 MED ORDER — OXYCODONE-ACETAMINOPHEN 5-325 MG PO TABS: 1.0000 | ORAL_TABLET | ORAL | Status: DC | PRN

## 2013-09-24 MED ORDER — LIDOCAINE HCL (PF) 1 % IJ SOLN
30.0000 mL | INTRAMUSCULAR | Status: DC | PRN
  Filled 2013-09-24: qty 30

## 2013-09-24 MED ORDER — OXYTOCIN BOLUS FROM INFUSION
500.0000 mL | INTRAVENOUS | Status: DC
Start: 2013-09-24 — End: 2013-09-24

## 2013-09-24 MED ORDER — DIPHENHYDRAMINE HCL 25 MG PO CAPS: 25.0000 mg | ORAL_CAPSULE | Freq: Four times a day (QID) | ORAL | Status: DC | PRN

## 2013-09-24 MED ORDER — DIPHENHYDRAMINE HCL 50 MG/ML IJ SOLN: 12.5000 mg | INTRAMUSCULAR | Status: DC | PRN

## 2013-09-24 MED ORDER — FLEET ENEMA 7-19 GM/118ML RE ENEM
1.0000 | ENEMA | RECTAL | Status: DC | PRN
Start: 2013-09-24 — End: 2013-09-24

## 2013-09-24 MED ORDER — CITRIC ACID-SODIUM CITRATE 334-500 MG/5ML PO SOLN: 30.0000 mL | ORAL | Status: DC | PRN

## 2013-09-24 MED ORDER — OXYTOCIN 40 UNITS IN LACTATED RINGERS INFUSION - SIMPLE MED
62.5000 mL/h | INTRAVENOUS | Status: DC
Start: 2013-09-24 — End: 2013-09-24
  Filled 2013-09-24: qty 1000

## 2013-09-24 MED ORDER — LIDOCAINE HCL (PF) 1 % IJ SOLN
INTRAMUSCULAR | Status: DC | PRN
  Administered 2013-09-24 (×2): 5 mL

## 2013-09-24 MED ORDER — FAMOTIDINE 20 MG PO TABS: 40.0000 mg | ORAL_TABLET | Freq: Once | ORAL | Status: DC

## 2013-09-24 MED ORDER — IBUPROFEN 600 MG PO TABS
600.0000 mg | ORAL_TABLET | Freq: Four times a day (QID) | ORAL | Status: DC
  Administered 2013-09-24 – 2013-09-26 (×7): 600 mg via ORAL
  Filled 2013-09-24 (×7): qty 1

## 2013-09-24 MED ORDER — ONDANSETRON HCL 4 MG/2ML IJ SOLN: 4.0000 mg | Freq: Four times a day (QID) | INTRAMUSCULAR | Status: DC | PRN

## 2013-09-24 MED ORDER — DEXTROSE 5 % IV SOLN
2.5000 10*6.[IU] | INTRAVENOUS | Status: DC
  Administered 2013-09-24: 2.5 10*6.[IU] via INTRAVENOUS
  Filled 2013-09-24 (×4): qty 2.5

## 2013-09-24 MED ORDER — SENNOSIDES-DOCUSATE SODIUM 8.6-50 MG PO TABS
2.0000 | ORAL_TABLET | ORAL | Status: DC
  Administered 2013-09-24 – 2013-09-25 (×2): 2 via ORAL
  Filled 2013-09-24 (×2): qty 2

## 2013-09-24 MED ORDER — FENTANYL 2.5 MCG/ML BUPIVACAINE 1/10 % EPIDURAL INFUSION (WH - ANES)
14.0000 mL/h | INTRAMUSCULAR | Status: DC | PRN
  Administered 2013-09-24: 14 mL/h via EPIDURAL
  Filled 2013-09-24: qty 125

## 2013-09-24 MED ORDER — LACTATED RINGERS IV SOLN: INTRAVENOUS | Status: DC

## 2013-09-24 MED ORDER — WITCH HAZEL-GLYCERIN EX PADS: 1.0000 "application " | MEDICATED_PAD | CUTANEOUS | Status: DC | PRN

## 2013-09-24 MED ORDER — PENICILLIN G POTASSIUM 5000000 UNITS IJ SOLR
5.0000 10*6.[IU] | Freq: Once | INTRAVENOUS | Status: AC
  Administered 2013-09-24: 5 10*6.[IU] via INTRAVENOUS
  Filled 2013-09-24: qty 5

## 2013-09-24 MED ORDER — BENZOCAINE-MENTHOL 20-0.5 % EX AERO
1.0000 "application " | INHALATION_SPRAY | CUTANEOUS | Status: DC | PRN
  Filled 2013-09-24: qty 56

## 2013-09-24 MED ORDER — TETANUS-DIPHTH-ACELL PERTUSSIS 5-2.5-18.5 LF-MCG/0.5 IM SUSP: 0.5000 mL | Freq: Once | INTRAMUSCULAR | Status: DC

## 2013-09-24 MED ORDER — EPHEDRINE 5 MG/ML INJ
10.0000 mg | INTRAVENOUS | Status: DC | PRN
  Filled 2013-09-24: qty 2

## 2013-09-24 MED ORDER — IBUPROFEN 600 MG PO TABS: 600.0000 mg | ORAL_TABLET | Freq: Four times a day (QID) | ORAL | Status: DC | PRN

## 2013-09-24 MED ORDER — ZOLPIDEM TARTRATE 5 MG PO TABS: 5.0000 mg | ORAL_TABLET | Freq: Every evening | ORAL | Status: DC | PRN

## 2013-09-24 MED ORDER — METOCLOPRAMIDE HCL 10 MG PO TABS: 10.0000 mg | ORAL_TABLET | Freq: Once | ORAL | Status: DC

## 2013-09-24 MED ORDER — EPHEDRINE 5 MG/ML INJ
10.0000 mg | INTRAVENOUS | Status: DC | PRN
Start: 2013-09-24 — End: 2013-09-24
  Filled 2013-09-24: qty 4
  Filled 2013-09-24: qty 2

## 2013-09-24 MED ORDER — ACETAMINOPHEN 325 MG PO TABS: 650.0000 mg | ORAL_TABLET | ORAL | Status: DC | PRN

## 2013-09-24 MED ORDER — PHENYLEPHRINE 40 MCG/ML (10ML) SYRINGE FOR IV PUSH (FOR BLOOD PRESSURE SUPPORT)
80.0000 ug | PREFILLED_SYRINGE | INTRAVENOUS | Status: DC | PRN
  Filled 2013-09-24: qty 2
  Filled 2013-09-24: qty 10

## 2013-09-24 MED ORDER — LACTATED RINGERS IV SOLN: 500.0000 mL | Freq: Once | INTRAVENOUS | Status: DC

## 2013-09-24 MED ORDER — ONDANSETRON HCL 4 MG/2ML IJ SOLN: 4.0000 mg | INTRAMUSCULAR | Status: DC | PRN

## 2013-09-24 MED ORDER — LANOLIN HYDROUS EX OINT: TOPICAL_OINTMENT | CUTANEOUS | Status: DC | PRN

## 2013-09-24 MED ORDER — LACTATED RINGERS IV SOLN
INTRAVENOUS | Status: DC
  Administered 2013-09-24 (×2): via INTRAVENOUS

## 2013-09-24 MED ORDER — OXYTOCIN 40 UNITS IN LACTATED RINGERS INFUSION - SIMPLE MED
1.0000 m[IU]/min | INTRAVENOUS | Status: DC
  Administered 2013-09-24: 1 m[IU]/min via INTRAVENOUS

## 2013-09-24 MED ORDER — LACTATED RINGERS IV SOLN
500.0000 mL | INTRAVENOUS | Status: DC | PRN
  Administered 2013-09-24: 500 mL via INTRAVENOUS

## 2013-09-24 MED ORDER — TERBUTALINE SULFATE 1 MG/ML IJ SOLN: 0.2500 mg | Freq: Once | INTRAMUSCULAR | Status: DC | PRN

## 2013-09-24 NOTE — Anesthesia Preprocedure Evaluation (Signed)
Anesthesia Evaluation  Patient identified by MRN, date of birth, ID band Patient awake    Reviewed: Allergy & Precautions, H&P , Patient's Chart, lab work & pertinent test results  Airway Mallampati: II TM Distance: >3 FB Neck ROM: full    Dental   Pulmonary  breath sounds clear to auscultation        Cardiovascular Rhythm:regular Rate:Normal     Neuro/Psych    GI/Hepatic   Endo/Other    Renal/GU      Musculoskeletal   Abdominal   Peds  Hematology  (+) anemia ,   Anesthesia Other Findings   Reproductive/Obstetrics (+) Pregnancy                           Anesthesia Physical Anesthesia Plan  ASA: III  Anesthesia Plan: Epidural   Post-op Pain Management:    Induction:   Airway Management Planned:   Additional Equipment:   Intra-op Plan:   Post-operative Plan:   Informed Consent: I have reviewed the patients History and Physical, chart, labs and discussed the procedure including the risks, benefits and alternatives for the proposed anesthesia with the patient or authorized representative who has indicated his/her understanding and acceptance.     Plan Discussed with:   Anesthesia Plan Comments:         Anesthesia Quick Evaluation

## 2013-09-24 NOTE — Progress Notes (Signed)
Heidi Woods is a 33 y.o. [id number] at 773w2d.  Subjective: Moderate pressure and discomfort w/ epidural. Better than before. No urge to push   Objective: BP 129/82  Pulse 94  Temp(Src) 97.6 F (36.4 C) (Oral)  Resp 18  Ht 5\' 8"  (1.727 m)  Wt 88.451 kg (195 lb)  BMI 29.66 kg/m2  SpO2 100%  LMP 12/23/2012      FHT:  FHR: 145 bpm, variability: moderate,  accelerations:  Present,  decelerations:  Present ? 2 lates UC:   regular, every 2-3 minutes, strong SVE:   Dilation: 10 Effacement (%): 90 Station: 0 Exam by:: Malary Aylesworth cnm  Labs: Lab Results  Component Value Date   WBC 10.0 09/24/2013   HGB 7.6* 09/24/2013   HCT 22.0* 09/24/2013   MCV 60.9* 09/24/2013   PLT 169 09/24/2013    Assessment / Plan: Spontaneous labor, progressing normally  Labor: Progressing normally Preeclampsia:  NA Fetal Wellbeing:  Category I and Category II Pain Control:  Epidural I/D:  n/a Anticipated MOD:  NSVD  Gabino Hagin 09/24/2013, 3:37 PM

## 2013-09-24 NOTE — Anesthesia Procedure Notes (Signed)
Epidural Patient location during procedure: OB Start time: 09/24/2013 2:01 PM  Staffing Anesthesiologist: Brayton CavesJACKSON, Jessiah Wojnar Performed by: anesthesiologist   Preanesthetic Checklist Completed: patient identified, site marked, surgical consent, pre-op evaluation, timeout performed, IV checked, risks and benefits discussed and monitors and equipment checked  Epidural Patient position: sitting Prep: site prepped and draped and DuraPrep Patient monitoring: continuous pulse ox and blood pressure Approach: midline Injection technique: LOR air  Needle:  Needle type: Tuohy  Needle gauge: 17 G Needle length: 9 cm and 9 Needle insertion depth: 5 cm cm Catheter type: closed end flexible Catheter size: 19 Gauge Catheter at skin depth: 10 cm Test dose: negative  Assessment Events: blood not aspirated, injection not painful, no injection resistance, negative IV test and no paresthesia  Additional Notes Patient identified.  Risk benefits discussed including failed block, incomplete pain control, headache, nerve damage, paralysis, blood pressure changes, nausea, vomiting, reactions to medication both toxic or allergic, and postpartum back pain.  Patient expressed understanding and wished to proceed.  All questions were answered.  Sterile technique used throughout procedure and epidural site dressed with sterile barrier dressing. No paresthesia or other complications noted.The patient did not experience any signs of intravascular injection such as tinnitus or metallic taste in mouth nor signs of intrathecal spread such as rapid motor block. Please see nursing notes for vital signs.

## 2013-09-24 NOTE — H&P (Signed)
Attestation of Attending Supervision of Advanced Practitioner (CNM/NP): Evaluation and management procedures were performed by the Advanced Practitioner under my supervision and collaboration.  I have reviewed the Advanced Practitioner's note and chart, and I agree with the management and plan.  HARRAWAY-SMITH, Orvilla Truett 6:26 PM

## 2013-09-24 NOTE — MAU Note (Signed)
Pt presents complaining of contractions that began last pm and have gotten worse this morning. States they are every 3-4 minutes apart. Pt thinks her water may have broken when she went to the bathroom. Denies vaginal bleeding.

## 2013-09-24 NOTE — H&P (Signed)
Maternal Medical History:  Reason for admission: Nausea.    Heidi Woods is a 33 y.o. [id number] at 4558w2d (based on LMP 12/23/2012) who presented to MAU c/o contractions.  Seen in MAU yesterday - sent home since no cervical changes.  Stronger contractions started last night around 6 p.m.  Currently occuring 5 minutes apart.  Describes leakage of fluid after urinating - clear fluid and bloody mucous plug.  Good fetal activity.  No vaginal bleeding.  Patient would like BTL for contraception.  Desires baby boy to be circumcised.  Plans to bottle feed.  [facility]  [facility].  Previous pregnancies were SVD and patient reports no problems with labor or delivery.  Notes one of her children was born with "no vision in left eye."  Patient has sickle cell trait (her father has SCD).  Reports baby's father tested negative.  Patient Active Problem List   Diagnosis Date Noted  . Sickle cell trait 09/24/2013  . Family history of sickle cell disease 09/24/2013  . Active labor 09/24/2013  . GBS (group B Streptococcus carrier), +RV culture, currently pregnant 09/12/2013  . Desires Sterilization 05/31/2013  . Marginal insertion of umbilical cord 05/09/2013  . Supervision of normal pregnancy 04/05/2013  . LGSIL (low grade squamous intraepithelial lesion) on Pap smear 04/05/2013   [facility]  Dating LMP        Ultrasound consistent with LMP: Yes  Genetic Screen Declines  Anatomic US Normal except for marginal cord insertion  GTT 131             TDaP vaccine 06/27/13  Flu vaccine 06/27/13  GBS  positive  Baby Food Bottle  Contraception BTS, needs to sign papers around 28 weeks  Circumcision Yes  Pediatrician [facility]  LGSIL pap  OB History   Grav Para Term Preterm Abortions TAB SAB Ect Mult Living   12 2 2  0 9 7 2  0 0 2     Past Medical History  Diagnosis Date  . Anemia   . Allergy     seasonal  . LGSIL  (low grade squamous intraepithelial lesion) on Pap smear 04/05/2013  . Headache(784.0)   . Ovarian cyst   . Abnormal Pap smear of cervix 2004    colpo, ok since  . Infection     UTI  . Sickle cell trait    Past Surgical History  Procedure Laterality Date  . Cholecystectomy  2012  . Induced abortion      x 7  . Umbbilical hernia repair     Family History: family history includes Cancer in her father, maternal grandfather, maternal grandmother, and paternal grandfather; Hypertension in her maternal grandfather and maternal grandmother; Sickle cell anemia in her father. Social History:  reports that she has never smoked. She has never used smokeless tobacco. She reports that she does not drink alcohol or use illicit drugs.   Prenatal Transfer Tool  Maternal Diabetes: No 1 hr GTT = 131, no 3 hr GTT Genetic Screening: Declined Maternal Ultrasounds/Referrals: Abnormal:  Findings:   Other: marginal insertion of cord Fetal Ultrasounds or other Referrals:  None Maternal Substance Abuse:  No Significant Maternal Medications:  None Significant Maternal Lab Results:  Lab values include: Group B Strep positive Other Comments:  Sickle cell triat  Review of Systems  Constitutional: Negative for fever and chills.  Eyes: Negative for blurred vision.  Respiratory: Negative for cough and shortness of breath.   Cardiovascular:  Positive for leg swelling (left > right, "almost entire pregnancy"). Negative for chest pain.  Gastrointestinal: Negative for nausea, vomiting, abdominal pain and diarrhea.  Genitourinary:       + contractions, + leakage of fluids (today after urinating), + vaginal bleeding ("when mucous plug came out" - today after urinating)  Skin: Negative for rash.  Neurological: Negative for headaches.   Dilation: 5 Effacement (%): 80 Station: -2 Exam by:: Rennie Plowman, RN  Blood pressure 126/73, pulse 88, temperature 97.8 F (36.6 C), temperature source Oral, resp. rate 18, last  menstrual period 12/23/2012.  Exam Physical Exam  Constitutional: She is oriented to person, place, and time. She appears well-developed and well-nourished. No distress.  HENT:  Head: Normocephalic and atraumatic.  Moist mucous membranes, no scleral icterus  Cardiovascular: Normal rate, regular rhythm, normal heart sounds and intact distal pulses.   Respiratory: Effort normal and breath sounds normal. No respiratory distress. She has no wheezes.  GI: Soft. She exhibits no distension. There is no tenderness. There is no rebound and no guarding.  Musculoskeletal: She exhibits edema (left > right, 1+). She exhibits no tenderness (calf).  Neurological: She is alert and oriented to person, place, and time.  Skin: Skin is warm and dry. No rash noted. She is not diaphoretic.  Psychiatric: She has a normal mood and affect. Her behavior is normal.    Prenatal labs: ABO, Rh: O/POS/-- (07/22 1612) Antibody: NEG (07/22 1612) Rubella: 6.13 (07/22 1612) RPR: NON REAC (10/13 1552)  HBsAg: NEGATIVE (07/22 1612)  HIV: NON REACTIVE (10/13 1552)  GBS: Positive (12/27 1021)   Assessment/Plan: #SOL - Pregnancy:  IV insertion, start IVFs, fetal monitoring; would like to wait on epidural for now #GBS + - receiving PCN #MOC:  BTS, papers signed #Bottle-feeding #Baby boy to be circumcised #GYN Clinic:  [facility] #Peds Clinic:  [facility]   [place], [place] L  09/24/2013, 11:04 AM

## 2013-09-25 ENCOUNTER — Inpatient Hospital Stay (HOSPITAL_COMMUNITY): Payer: 59 | Admitting: Anesthesiology

## 2013-09-25 ENCOUNTER — Encounter (HOSPITAL_COMMUNITY)
Admission: AD | Disposition: A | Discharge status: Home / Self Care | Payer: Self-pay | Source: Ambulatory Visit | Attending: Obstetrics & Gynecology

## 2013-09-25 ENCOUNTER — Encounter (HOSPITAL_COMMUNITY): Payer: 59 | Admitting: Anesthesiology

## 2013-09-25 LAB — CBC
HCT: 17.8 % — ABNORMAL LOW (ref 36.0–46.0)
Hemoglobin: 6.2 g/dL — CL (ref 12.0–15.0)
MCH: 21.1 pg — AB (ref 26.0–34.0)
MCHC: 34.8 g/dL (ref 30.0–36.0)
MCV: 60.5 fL — AB (ref 78.0–100.0)
PLATELETS: 146 10*3/uL — AB (ref 150–400)
RBC: 2.94 MIL/uL — AB (ref 3.87–5.11)
RDW: 22.7 % — AB (ref 11.5–15.5)
WBC: 12.7 10*3/uL — ABNORMAL HIGH (ref 4.0–10.5)

## 2013-09-25 LAB — SURGICAL PCR SCREEN
MRSA, PCR: POSITIVE — AB
Staphylococcus aureus: POSITIVE — AB

## 2013-09-25 LAB — PREPARE RBC (CROSSMATCH)

## 2013-09-25 SURGERY — LIGATION, FALLOPIAN TUBE, POSTPARTUM
Anesthesia: Epidural | Laterality: Bilateral

## 2013-09-25 MED ORDER — CHLORHEXIDINE GLUCONATE CLOTH 2 % EX PADS
6.0000 | MEDICATED_PAD | Freq: Every day | CUTANEOUS | Status: DC
Start: 2013-09-25 — End: 2013-09-26

## 2013-09-25 MED ORDER — MUPIROCIN 2 % EX OINT
1.0000 "application " | TOPICAL_OINTMENT | Freq: Two times a day (BID) | CUTANEOUS | Status: DC
  Administered 2013-09-25 – 2013-09-26 (×2): 1 via NASAL
  Filled 2013-09-25: qty 22

## 2013-09-25 SURGICAL SUPPLY — 14 items
CLOTH BEACON ORANGE TIMEOUT ST (SAFETY) IMPLANT
DURAPREP 26ML APPLICATOR (WOUND CARE) IMPLANT
GLOVE BIO SURGEON STRL SZ 6.5 (GLOVE) IMPLANT
GOWN PREVENTION PLUS LG XLONG (DISPOSABLE) IMPLANT
NS IRRIG 1000ML POUR BTL (IV SOLUTION) IMPLANT
PACK ABDOMINAL MINOR (CUSTOM PROCEDURE TRAY) IMPLANT
STRIP CLOSURE SKIN 1/4X3 (GAUZE/BANDAGES/DRESSINGS) IMPLANT
SUT CHROMIC 0 CT 1 (SUTURE) IMPLANT
SUT VIC AB 0 CT1 27 (SUTURE)
SUT VIC AB 0 CT1 27XBRD ANBCTR (SUTURE) IMPLANT
SUT VICRYL 4-0 PS2 18IN ABS (SUTURE) IMPLANT
TOWEL OR 17X24 6PK STRL BLUE (TOWEL DISPOSABLE) IMPLANT
TRAY FOLEY CATH 14FR (SET/KITS/TRAYS/PACK) IMPLANT
WATER STERILE IRR 1000ML POUR (IV SOLUTION) IMPLANT

## 2013-09-25 NOTE — Anesthesia Preprocedure Evaluation (Deleted)
Anesthesia Evaluation  Patient identified by MRN, date of birth, ID band Patient awake    Reviewed: Allergy & Precautions, H&P , Patient's Chart, lab work & pertinent test results  Airway Mallampati: II TM Distance: >3 FB Neck ROM: full    Dental   Pulmonary  breath sounds clear to auscultation        Cardiovascular Rhythm:regular Rate:Normal     Neuro/Psych    GI/Hepatic   Endo/Other    Renal/GU      Musculoskeletal   Abdominal   Peds  Hematology  (+) anemia ,   Anesthesia Other Findings   Reproductive/Obstetrics                           Anesthesia Physical  Anesthesia Plan  ASA: III  Anesthesia Plan: Epidural   Post-op Pain Management:    Induction:   Airway Management Planned:   Additional Equipment:   Intra-op Plan:   Post-operative Plan:   Informed Consent: I have reviewed the patients History and Physical, chart, labs and discussed the procedure including the risks, benefits and alternatives for the proposed anesthesia with the patient or authorized representative who has indicated his/her understanding and acceptance.     Plan Discussed with:   Anesthesia Plan Comments:         Anesthesia Quick Evaluation

## 2013-09-25 NOTE — Progress Notes (Addendum)
Post Partum Day 1 Subjective: no complaints, up ad lib, voiding, tolerating PO and + flatus  Objective: Blood pressure 121/72, pulse 84, temperature 97.9 F (36.6 C), temperature source Oral, resp. rate 20, height 5\' 8"  (1.727 m), weight 88.451 kg (195 lb), last menstrual period 12/23/2012, SpO2 100.00%, unknown if currently breastfeeding.  Physical Exam:  General: alert, cooperative, appears stated age, fatigued and no distress Lochia: appropriate Uterine Fundus: firm DVT Evaluation: No evidence of DVT seen on physical exam. Negative Homan's sign. No cords or calf tenderness. No significant calf/ankle edema.   Recent Labs  09/24/13 1058 09/25/13 0648  HGB 7.6* 6.2*  HCT 22.0* 17.8*    Assessment/Plan: Plan for discharge tomorrow and Contraception depo vs BTL - pt questioning plan. Will have attending see pt after rounds. Pt is also severely anemic and plan to give 2U PRBCs but pt would like to think about it   LOS: 1 day   Colbie Danner RYAN 09/25/2013, 7:26 AM

## 2013-09-25 NOTE — Progress Notes (Signed)
Hemoglobin 6.2

## 2013-09-25 NOTE — Anesthesia Postprocedure Evaluation (Signed)
  Anesthesia Post-op Note  Anesthesia Post Note  Patient: Heidi Woods  Procedure(s) Performed: * No procedures listed *  Anesthesia type: Epidural  Patient location: Mother/Baby  Post pain: Pain level controlled  Post assessment: Post-op Vital signs reviewed  Last Vitals:  Filed Vitals:   09/25/13 0614  BP: 121/72  Pulse: 84  Temp: 36.6 C  Resp: 20    Post vital signs: Reviewed  Level of consciousness:alert  Complications: No apparent anesthesia complications

## 2013-09-25 NOTE — Progress Notes (Signed)
CRITICAL VALUE ALERT  Critical value received:  Hemoglobin 6.2  Date of notification:  1/11  Time of notification:  0715  Critical value read back:yes  Nurse who received alert:  s Karna Abed  MD notified (1st page):  Dr Ike Beneodom  Time of first page:  [phone]

## 2013-09-25 NOTE — Progress Notes (Signed)
Patient states refuses blood and BTL.  OR notified  T/S notifed to cancel orders.

## 2013-09-26 LAB — CBC
HCT: 18 % — ABNORMAL LOW (ref 36.0–46.0)
HEMOGLOBIN: 6.2 g/dL — AB (ref 12.0–15.0)
MCH: 21.1 pg — AB (ref 26.0–34.0)
MCHC: 34.4 g/dL (ref 30.0–36.0)
MCV: 61.2 fL — ABNORMAL LOW (ref 78.0–100.0)
Platelets: 161 10*3/uL (ref 150–400)
RBC: 2.94 MIL/uL — ABNORMAL LOW (ref 3.87–5.11)
RDW: 23.2 % — ABNORMAL HIGH (ref 11.5–15.5)
WBC: 10.7 10*3/uL — ABNORMAL HIGH (ref 4.0–10.5)

## 2013-09-26 MED ORDER — IBUPROFEN 600 MG PO TABS: 600.0000 mg | ORAL_TABLET | Freq: Four times a day (QID) | ORAL | Status: DC

## 2013-09-26 NOTE — Discharge Summary (Signed)
Obstetric Discharge Summary Reason for Admission: onset of labor Prenatal Procedures: NST Intrapartum Procedures: spontaneous vaginal delivery Postpartum Procedures: none Complications-Operative and Postpartum: none Hemoglobin  Date Value Range Status  09/25/2013 6.2* 12.0 - 15.0 g/dL Final     REPEATED TO VERIFY     CRITICAL RESULT CALLED TO, READ BACK BY AND VERIFIED WITH:     S BURNS 09/25/13 0715 BY A POTEAT     HCT  Date Value Range Status  09/25/2013 17.8* 36.0 - 46.0 % Final    Physical Exam:  General: alert, cooperative and no distress Lochia: appropriate Uterine Fundus: firm Incision: na DVT Evaluation: No evidence of DVT seen on physical exam. No cords or calf tenderness. No significant calf/ankle edema.  Discharge Diagnoses: Term Pregnancy-delivered  Discharge Information: Date: 09/26/2013 Activity: pelvic rest Diet: routine Medications: PNV and Ibuprofen Condition: stable Instructions: refer to practice specific booklet Discharge to: home Follow-up Information   [facility] In 4 weeks.   Specialty:  Obstetrics and Gynecology   Contact information:   [address] [place] [id number] [phone]      Newborn Data: Live born female  Birth Weight: 8 lb 9.6 oz (3900 g) APGAR: 9, 9  Home with mother.   Pt presented in active labor and progressed to deliver a liveborn female via NSVD. Her post partum course was complicated by severe anemia with a Hgb of 6.2. Pt was counseled and refused blood transfusion. Her hgb on day of discharge was stable at 6.2. She will stay on iron and f/u in 4 weeks. She is breast feeding and desires depo for contraception.    Jatavia Keltner L 09/26/2013, 8:05 AM

## 2013-09-26 NOTE — Discharge Instructions (Signed)

## 2013-09-26 NOTE — Progress Notes (Signed)
Circumcision Counseling Progress Note  Patient desires circumcision for her female infant.  Circumcision procedure details discussed, risks and benefits of procedure were also discussed.  These include but are not limited to: Benefits of circumcision in men include reduction in the rates of urinary tract infection (UTI), penile cancer, some sexually transmitted infections, penile inflammatory and retractile disorders, as well as easier hygiene.  Risks include bleeding , infection, injury of glans which may lead to penile deformity or urinary tract issues, unsatisfactory cosmetic appearance and other potential complications related to the procedure.  It was emphasized that this is an elective procedure.  Patient wants to proceed with circumcision; written informed consent obtained.  Will do circumcision soon, routine circumcision and post circumcision care ordered for the infant.  Rulon AbideKeli Rilley Stash, M.D. [facility] 09/26/2013 8:11 AM

## 2013-09-26 NOTE — Addendum Note (Signed)
Addendum created 09/26/13 1314 by Graciela HusbandsWynn O Nohely Whitehorn, CRNA   Modules edited: Anesthesia Events, Anesthesia LDA

## 2013-09-27 ENCOUNTER — Encounter: Payer: Medicaid Other | Admitting: Obstetrics & Gynecology

## 2013-09-27 LAB — TYPE AND SCREEN
ABO/RH(D): O POS
ANTIBODY SCREEN: NEGATIVE
UNIT DIVISION: 0
Unit division: 0

## 2013-11-08 ENCOUNTER — Ambulatory Visit: Payer: 59 | Admitting: Obstetrics & Gynecology

## 2013-11-15 ENCOUNTER — Ambulatory Visit (INDEPENDENT_AMBULATORY_CARE_PROVIDER_SITE_OTHER): Payer: 59 | Admitting: Obstetrics & Gynecology

## 2013-11-15 ENCOUNTER — Encounter: Payer: Self-pay | Admitting: Obstetrics & Gynecology

## 2013-11-15 DIAGNOSIS — Z3049 Encounter for surveillance of other contraceptives: Secondary | ICD-10-CM

## 2013-11-15 MED ORDER — MEDROXYPROGESTERONE ACETATE 150 MG/ML IM SUSP
150.0000 mg | Freq: Once | INTRAMUSCULAR | Status: AC
  Administered 2013-11-15: 150 mg via INTRAMUSCULAR

## 2013-11-15 NOTE — Progress Notes (Signed)
   Subjective:    Patient ID: Heidi Woods, female    DOB: [birth year], 33 y.o.   MRN: [id number]  HPI  Heidi Woods is a P3 here for her 6 week pp visit. She had a NSVD with a 8 1/2 pound son. She has no complaints, baby is growing and sleeping through the night! She denies depression symptoms. She reports normal bowel and bladder function. She has not had sex yet and wants to return to depo provera as her form of contraception  Review of Systems     Objective:   Physical Exam   Normal vulva     Assessment & Plan:   Post partum- doing well Depo provera today and q 12 weeks

## 2013-11-15 NOTE — Addendum Note (Signed)
Addended by: Barbara CowerNOGUES, Tiara Bartoli L on: 11/15/2013 11:18 AM   Modules accepted: Orders

## 2014-03-27 ENCOUNTER — Ambulatory Visit (INDEPENDENT_AMBULATORY_CARE_PROVIDER_SITE_OTHER): Payer: 59 | Admitting: *Deleted

## 2014-03-27 DIAGNOSIS — Z3049 Encounter for surveillance of other contraceptives: Secondary | ICD-10-CM

## 2014-03-27 DIAGNOSIS — Z3042 Encounter for surveillance of injectable contraceptive: Secondary | ICD-10-CM

## 2014-03-27 MED ORDER — MEDROXYPROGESTERONE ACETATE 150 MG/ML IM SUSP
150.0000 mg | Freq: Once | INTRAMUSCULAR | Status: AC
  Administered 2014-03-27: 150 mg via INTRAMUSCULAR

## 2014-03-27 MED ORDER — MEDROXYPROGESTERONE ACETATE 150 MG/ML IM SUSP: 150.0000 mg | Freq: Once | INTRAMUSCULAR | Status: DC

## 2014-03-27 NOTE — Progress Notes (Deleted)
I have sent in refills to patients pharmacy for her Depo Provera.

## 2014-03-27 NOTE — Progress Notes (Signed)
Pt came in today for her Depo Provera injection

## 2014-06-26 ENCOUNTER — Ambulatory Visit: Payer: 59

## 2014-07-06 ENCOUNTER — Ambulatory Visit: Payer: 59

## 2014-07-17 ENCOUNTER — Encounter: Payer: Self-pay | Admitting: Obstetrics & Gynecology

## 2014-09-28 ENCOUNTER — Encounter (HOSPITAL_COMMUNITY): Payer: Self-pay | Admitting: Obstetrics & Gynecology

## 2015-02-22 ENCOUNTER — Encounter (HOSPITAL_COMMUNITY): Payer: Self-pay | Admitting: Obstetrics & Gynecology

## 2015-09-16 NOTE — L&D Delivery Note (Signed)
  Patient is 35 y.o. [id number] 6563w5d admitted in spontaneous labor, hx of anemia in pregnancy   Delivery Note At 9:06 AM a viable unspecified sex was delivered via Vaginal, Spontaneous Delivery (Presentation: vertex; LOA).  APGAR: 6, 8; weight pending.   Placenta status: delivered spontaneously with gentle traction, intact.  Cord: 3vc with the following complications: none.  Cord pH: not sent   Anesthesia:  none Episiotomy:  none Lacerations:  none Suture Repair: none Est. Blood Loss (mL):  250 mL  Mom to postpartum.  Baby to pending NICU eval.  Heidi Woods 07/22/2016, 9:23 AM      Upon arrival patient was complete and pushing. She pushed with good maternal effort to deliver a healthy baby boy. Baby delivered without difficulty, was noted to have good tone and place on maternal abdomen for oral suctioning, drying and stimulation. Delayed cord clamping performed. Placenta delivered intact with 3V cord. Vaginal canal and perineum was inspected and noted to be hemostatic. Pitocin was started and uterus massaged until bleeding slowed. Counts of sharps, instruments, and lap pads were all correct.   Heidi SersAngela C Jasan Doughtie, DO PGY-1 11/7/20179:23 AM

## 2015-10-10 ENCOUNTER — Ambulatory Visit (INDEPENDENT_AMBULATORY_CARE_PROVIDER_SITE_OTHER): Payer: Commercial Managed Care - HMO | Admitting: Certified Nurse Midwife

## 2015-10-10 ENCOUNTER — Encounter: Payer: Self-pay | Admitting: Certified Nurse Midwife

## 2015-10-10 ENCOUNTER — Other Ambulatory Visit (INDEPENDENT_AMBULATORY_CARE_PROVIDER_SITE_OTHER): Payer: Commercial Managed Care - HMO | Admitting: *Deleted

## 2015-10-10 DIAGNOSIS — O26851 Spotting complicating pregnancy, first trimester: Secondary | ICD-10-CM | POA: Diagnosis not present

## 2015-10-10 DIAGNOSIS — N912 Amenorrhea, unspecified: Secondary | ICD-10-CM

## 2015-10-10 NOTE — Progress Notes (Signed)
Patient came in today for a new OB appt but had a heavy bleed for 3 days so a Beta HCG was drawn today and will redraw on 1/27.

## 2015-10-11 LAB — HCG, QUANTITATIVE, PREGNANCY: hCG, Beta Chain, Quant, S: <2 m[IU]/mL

## 2015-10-12 ENCOUNTER — Other Ambulatory Visit: Payer: Commercial Managed Care - HMO

## 2015-12-17 ENCOUNTER — Encounter: Payer: Self-pay | Admitting: Obstetrics & Gynecology

## 2015-12-17 ENCOUNTER — Encounter (HOSPITAL_COMMUNITY): Payer: Self-pay | Admitting: Obstetrics & Gynecology

## 2015-12-17 ENCOUNTER — Ambulatory Visit (INDEPENDENT_AMBULATORY_CARE_PROVIDER_SITE_OTHER): Payer: Commercial Managed Care - HMO | Admitting: Obstetrics & Gynecology

## 2015-12-17 VITALS — BP 121/73 | HR 109 | Ht 68.5 in | Wt 165.0 lb

## 2015-12-17 DIAGNOSIS — Z124 Encounter for screening for malignant neoplasm of cervix: Secondary | ICD-10-CM | POA: Diagnosis not present

## 2015-12-17 DIAGNOSIS — Z1151 Encounter for screening for human papillomavirus (HPV): Secondary | ICD-10-CM

## 2015-12-17 DIAGNOSIS — Z113 Encounter for screening for infections with a predominantly sexual mode of transmission: Secondary | ICD-10-CM | POA: Diagnosis not present

## 2015-12-17 DIAGNOSIS — Z3481 Encounter for supervision of other normal pregnancy, first trimester: Secondary | ICD-10-CM | POA: Diagnosis not present

## 2015-12-17 DIAGNOSIS — O219 Vomiting of pregnancy, unspecified: Secondary | ICD-10-CM

## 2015-12-17 DIAGNOSIS — O99019 Anemia complicating pregnancy, unspecified trimester: Secondary | ICD-10-CM

## 2015-12-17 DIAGNOSIS — O09521 Supervision of elderly multigravida, first trimester: Secondary | ICD-10-CM

## 2015-12-17 DIAGNOSIS — Z36 Encounter for antenatal screening of mother: Secondary | ICD-10-CM

## 2015-12-17 DIAGNOSIS — O09529 Supervision of elderly multigravida, unspecified trimester: Secondary | ICD-10-CM | POA: Insufficient documentation

## 2015-12-17 DIAGNOSIS — Z3491 Encounter for supervision of normal pregnancy, unspecified, first trimester: Secondary | ICD-10-CM

## 2015-12-17 NOTE — Addendum Note (Signed)
Addended by: Gita KudoLASSITER, KRISTEN S on: 12/17/2015 04:37 PM   Modules accepted: Orders

## 2015-12-17 NOTE — Patient Instructions (Signed)
 First Trimester of Pregnancy The first trimester of pregnancy is from week 1 until the end of week 12 (months 1 through 3). A week after a sperm fertilizes an egg, the egg will implant on the wall of the uterus. This embryo will begin to develop into a baby. Genes from you and your partner are forming the baby. The female genes determine whether the baby is a boy or a girl. At 6-8 weeks, the eyes and face are formed, and the heartbeat can be seen on ultrasound. At the end of 12 weeks, all the baby's organs are formed.  Now that you are pregnant, you will want to do everything you can to have a healthy baby. Two of the most important things are to get good prenatal care and to follow your health care provider's instructions. Prenatal care is all the medical care you receive before the baby's birth. This care will help prevent, find, and treat any problems during the pregnancy and childbirth. BODY CHANGES Your body goes through many changes during pregnancy. The changes vary from woman to woman.   You may gain or lose a couple of pounds at first.  You may feel sick to your stomach (nauseous) and throw up (vomit). If the vomiting is uncontrollable, call your health care provider.  You may tire easily.  You may develop headaches that can be relieved by medicines approved by your health care provider.  You may urinate more often. Painful urination may mean you have a bladder infection.  You may develop heartburn as a result of your pregnancy.  You may develop constipation because certain hormones are causing the muscles that push waste through your intestines to slow down.  You may develop hemorrhoids or swollen, bulging veins (varicose veins).  Your breasts may begin to grow larger and become tender. Your nipples may stick out more, and the tissue that surrounds them (areola) may become darker.  Your gums may bleed and may be sensitive to brushing and flossing.  Dark spots or blotches  (chloasma, mask of pregnancy) may develop on your face. This will likely fade after the baby is born.  Your menstrual periods will stop.  You may have a loss of appetite.  You may develop cravings for certain kinds of food.  You may have changes in your emotions from day to day, such as being excited to be pregnant or being concerned that something may go wrong with the pregnancy and baby.  You may have more vivid and strange dreams.  You may have changes in your hair. These can include thickening of your hair, rapid growth, and changes in texture. Some women also have hair loss during or after pregnancy, or hair that feels dry or thin. Your hair will most likely return to normal after your baby is born. WHAT TO EXPECT AT YOUR PRENATAL VISITS During a routine prenatal visit:  You will be weighed to make sure you and the baby are growing normally.  Your blood pressure will be taken.  Your abdomen will be measured to track your baby's growth.  The fetal heartbeat will be listened to starting around week 10 or 12 of your pregnancy.  Test results from any previous visits will be discussed. Your health care provider may ask you:  How you are feeling.  If you are feeling the baby move.  If you have had any abnormal symptoms, such as leaking fluid, bleeding, severe headaches, or abdominal cramping.  If you are using any tobacco   products, including cigarettes, chewing tobacco, and electronic cigarettes.  If you have any questions. Other tests that may be performed during your first trimester include:  Blood tests to find your blood type and to check for the presence of any previous infections. They will also be used to check for low iron levels (anemia) and Rh antibodies. Later in the pregnancy, blood tests for diabetes will be done along with other tests if problems develop.  Urine tests to check for infections, diabetes, or protein in the urine.  An ultrasound to confirm the  proper growth and development of the baby.  An amniocentesis to check for possible genetic problems.  Fetal screens for spina bifida and Down syndrome.  You may need other tests to make sure you and the baby are doing well.  HIV (human immunodeficiency virus) testing. Routine prenatal testing includes screening for HIV, unless you choose not to have this test. HOME CARE INSTRUCTIONS  Medicines  Follow your health care provider's instructions regarding medicine use. Specific medicines may be either safe or unsafe to take during pregnancy.  Take your prenatal vitamins as directed.  If you develop constipation, try taking a stool softener if your health care provider approves. Diet  Eat regular, well-balanced meals. Choose a variety of foods, such as meat or vegetable-based protein, fish, milk and low-fat dairy products, vegetables, fruits, and whole grain breads and cereals. Your health care provider will help you determine the amount of weight gain that is right for you.  Avoid raw meat and uncooked cheese. These carry germs that can cause birth defects in the baby.  Eating four or five small meals rather than three large meals a day may help relieve nausea and vomiting. If you start to feel nauseous, eating a few soda crackers can be helpful. Drinking liquids between meals instead of during meals also seems to help nausea and vomiting.  If you develop constipation, eat more high-fiber foods, such as fresh vegetables or fruit and whole grains. Drink enough fluids to keep your urine clear or pale yellow. Activity and Exercise  Exercise only as directed by your health care provider. Exercising will help you:  Control your weight.  Stay in shape.  Be prepared for labor and delivery.  Experiencing pain or cramping in the lower abdomen or low back is a good sign that you should stop exercising. Check with your health care provider before continuing normal exercises.  Try to avoid  standing for long periods of time. Move your legs often if you must stand in one place for a long time.  Avoid heavy lifting.  Wear low-heeled shoes, and practice good posture.  You may continue to have sex unless your health care provider directs you otherwise. Relief of Pain or Discomfort  Wear a good support bra for breast tenderness.   Take warm sitz baths to soothe any pain or discomfort caused by hemorrhoids. Use hemorrhoid cream if your health care provider approves.   Rest with your legs elevated if you have leg cramps or low back pain.  If you develop varicose veins in your legs, wear support hose. Elevate your feet for 15 minutes, 3-4 times a day. Limit salt in your diet. Prenatal Care  Schedule your prenatal visits by the twelfth week of pregnancy. They are usually scheduled monthly at first, then more often in the last 2 months before delivery.  Write down your questions. Take them to your prenatal visits.  Keep all your prenatal visits as directed by   your health care provider. Safety  Wear your seat belt at all times when driving.  Make a list of emergency phone numbers, including numbers for family, friends, the hospital, and police and fire departments. General Tips  Ask your health care provider for a referral to a local prenatal education class. Begin classes no later than at the beginning of month 6 of your pregnancy.  Ask for help if you have counseling or nutritional needs during pregnancy. Your health care provider can offer advice or refer you to specialists for help with various needs.  Do not use hot tubs, steam rooms, or saunas.  Do not douche or use tampons or scented sanitary pads.  Do not cross your legs for long periods of time.  Avoid cat litter boxes and soil used by cats. These carry germs that can cause birth defects in the baby and possibly loss of the fetus by miscarriage or stillbirth.  Avoid all smoking, herbs, alcohol, and medicines  not prescribed by your health care provider. Chemicals in these affect the formation and growth of the baby.  Do not use any tobacco products, including cigarettes, chewing tobacco, and electronic cigarettes. If you need help quitting, ask your health care provider. You may receive counseling support and other resources to help you quit.  Schedule a dentist appointment. At home, brush your teeth with a soft toothbrush and be gentle when you floss. SEEK MEDICAL CARE IF:   You have dizziness.  You have mild pelvic cramps, pelvic pressure, or nagging pain in the abdominal area.  You have persistent nausea, vomiting, or diarrhea.  You have a bad smelling vaginal discharge.  You have pain with urination.  You notice increased swelling in your face, hands, legs, or ankles. SEEK IMMEDIATE MEDICAL CARE IF:   You have a fever.  You are leaking fluid from your vagina.  You have spotting or bleeding from your vagina.  You have severe abdominal cramping or pain.  You have rapid weight gain or loss.  You vomit blood or material that looks like coffee grounds.  You are exposed to German measles and have never had them.  You are exposed to fifth disease or chickenpox.  You develop a severe headache.  You have shortness of breath.  You have any kind of trauma, such as from a fall or a car accident.   This information is not intended to replace advice given to you by your health care provider. Make sure you discuss any questions you have with your health care provider.   Document Released: 08/26/2001 Document Revised: 09/22/2014 Document Reviewed: 07/12/2013 [employer] Interactive Patient Education 2016 [employer]   Breastfeeding Deciding to breastfeed is one of the best choices you can make for you and your baby. A change in hormones during pregnancy causes your breast tissue to grow and increases the number and size of your milk ducts. These hormones also allow proteins, sugars,  and fats from your blood supply to make breast milk in your milk-producing glands. Hormones prevent breast milk from being released before your baby is born as well as prompt milk flow after birth. Once breastfeeding has begun, thoughts of your baby, as well as his or her sucking or crying, can stimulate the release of milk from your milk-producing glands.  BENEFITS OF BREASTFEEDING For Your Baby  Your first milk (colostrum) helps your baby's digestive system function better.  There are antibodies in your milk that help your baby fight off infections.  Your baby has   a lower incidence of asthma, allergies, and sudden infant death syndrome.  The nutrients in breast milk are better for your baby than infant formulas and are designed uniquely for your baby's needs.  Breast milk improves your baby's brain development.  Your baby is less likely to develop other conditions, such as childhood obesity, asthma, or type 2 diabetes mellitus. For You  Breastfeeding helps to create a very special Rump between you and your baby.  Breastfeeding is convenient. Breast milk is always available at the correct temperature and costs nothing.  Breastfeeding helps to burn calories and helps you lose the weight gained during pregnancy.  Breastfeeding makes your uterus contract to its prepregnancy size faster and slows bleeding (lochia) after you give birth.   Breastfeeding helps to lower your risk of developing type 2 diabetes mellitus, osteoporosis, and breast or ovarian cancer later in life. SIGNS THAT YOUR BABY IS HUNGRY Early Signs of Hunger  Increased alertness or activity.  Stretching.  Movement of the head from side to side.  Movement of the head and opening of the mouth when the corner of the mouth or cheek is stroked (rooting).  Increased sucking sounds, smacking lips, cooing, sighing, or squeaking.  Hand-to-mouth movements.  Increased sucking of fingers or hands. Late Signs of  Hunger  Fussing.  Intermittent crying. Extreme Signs of Hunger Signs of extreme hunger will require calming and consoling before your baby will be able to breastfeed successfully. Do not wait for the following signs of extreme hunger to occur before you initiate breastfeeding:  Restlessness.  A loud, strong cry.  Screaming. BREASTFEEDING BASICS Breastfeeding Initiation  Find a comfortable place to sit or lie down, with your neck and back well supported.  Place a pillow or rolled up blanket under your baby to bring him or her to the level of your breast (if you are seated). Nursing pillows are specially designed to help support your arms and your baby while you breastfeed.  Make sure that your baby's abdomen is facing your abdomen.  Gently massage your breast. With your fingertips, massage from your chest wall toward your nipple in a circular motion. This encourages milk flow. You may need to continue this action during the feeding if your milk flows slowly.  Support your breast with 4 fingers underneath and your thumb above your nipple. Make sure your fingers are well away from your nipple and your baby's mouth.  Stroke your baby's lips gently with your finger or nipple.  When your baby's mouth is open wide enough, quickly bring your baby to your breast, placing your entire nipple and as much of the colored area around your nipple (areola) as possible into your baby's mouth.  More areola should be visible above your baby's upper lip than below the lower lip.  Your baby's tongue should be between his or her lower gum and your breast.  Ensure that your baby's mouth is correctly positioned around your nipple (latched). Your baby's lips should create a seal on your breast and be turned out (everted).  It is common for your baby to suck about 2-3 minutes in order to start the flow of breast milk. Latching Teaching your baby how to latch on to your breast properly is very important.  An improper latch can cause nipple pain and decreased milk supply for you and poor weight gain in your baby. Also, if your baby is not latched onto your nipple properly, he or she may swallow some air during feeding. This   can make your baby fussy. Burping your baby when you switch breasts during the feeding can help to get rid of the air. However, teaching your baby to latch on properly is still the best way to prevent fussiness from swallowing air while breastfeeding. Signs that your baby has successfully latched on to your nipple:  Silent tugging or silent sucking, without causing you pain.  Swallowing heard between every 3-4 sucks.  Muscle movement above and in front of his or her ears while sucking. Signs that your baby has not successfully latched on to nipple:  Sucking sounds or smacking sounds from your baby while breastfeeding.  Nipple pain. If you think your baby has not latched on correctly, slip your finger into the corner of your baby's mouth to break the suction and place it between your baby's gums. Attempt breastfeeding initiation again. Signs of Successful Breastfeeding Signs from your baby:  A gradual decrease in the number of sucks or complete cessation of sucking.  Falling asleep.  Relaxation of his or her body.  Retention of a small amount of milk in his or her mouth.  Letting go of your breast by himself or herself. Signs from you:  Breasts that have increased in firmness, weight, and size 1-3 hours after feeding.  Breasts that are softer immediately after breastfeeding.  Increased milk volume, as well as a change in milk consistency and color by the fifth day of breastfeeding.  Nipples that are not sore, cracked, or bleeding. Signs That Your Baby is Getting Enough Milk  Wetting at least 3 diapers in a 24-hour period. The urine should be clear and pale yellow by age 5 days.  At least 3 stools in a 24-hour period by age 5 days. The stool should be soft and  yellow.  At least 3 stools in a 24-hour period by age 7 days. The stool should be seedy and yellow.  No loss of weight greater than 10% of birth weight during the first 3 days of age.  Average weight gain of 4-7 ounces (113-198 g) per week after age 4 days.  Consistent daily weight gain by age 5 days, without weight loss after the age of 2 weeks. After a feeding, your baby may spit up a small amount. This is common. BREASTFEEDING FREQUENCY AND DURATION Frequent feeding will help you make more milk and can prevent sore nipples and breast engorgement. Breastfeed when you feel the need to reduce the fullness of your breasts or when your baby shows signs of hunger. This is called "breastfeeding on demand." Avoid introducing a pacifier to your baby while you are working to establish breastfeeding (the first 4-6 weeks after your baby is born). After this time you may choose to use a pacifier. Research has shown that pacifier use during the first year of a baby's life decreases the risk of sudden infant death syndrome (SIDS). Allow your baby to feed on each breast as long as he or she wants. Breastfeed until your baby is finished feeding. When your baby unlatches or falls asleep while feeding from the first breast, offer the second breast. Because newborns are often sleepy in the first few weeks of life, you may need to awaken your baby to get him or her to feed. Breastfeeding times will vary from baby to baby. However, the following rules can serve as a guide to help you ensure that your baby is properly fed:  Newborns (babies 4 weeks of age or younger) may breastfeed every 1-3 hours.    Newborns should not go longer than 3 hours during the day or 5 hours during the night without breastfeeding.  You should breastfeed your baby a minimum of 8 times in a 24-hour period until you begin to introduce solid foods to your baby at around 6 months of age. BREAST MILK PUMPING Pumping and storing breast milk  allows you to ensure that your baby is exclusively fed your breast milk, even at times when you are unable to breastfeed. This is especially important if you are going back to work while you are still breastfeeding or when you are not able to be present during feedings. Your lactation consultant can give you guidelines on how long it is safe to store breast milk. A breast pump is a machine that allows you to pump milk from your breast into a sterile bottle. The pumped breast milk can then be stored in a refrigerator or freezer. Some breast pumps are operated by hand, while others use electricity. Ask your lactation consultant which type will work best for you. Breast pumps can be purchased, but some hospitals and breastfeeding support groups lease breast pumps on a monthly basis. A lactation consultant can teach you how to hand express breast milk, if you prefer not to use a pump. CARING FOR YOUR BREASTS WHILE YOU BREASTFEED Nipples can become dry, cracked, and sore while breastfeeding. The following recommendations can help keep your breasts moisturized and healthy:  Avoid using soap on your nipples.  Wear a supportive bra. Although not required, special nursing bras and tank tops are designed to allow access to your breasts for breastfeeding without taking off your entire bra or top. Avoid wearing underwire-style bras or extremely tight bras.  Air dry your nipples for 3-4minutes after each feeding.  Use only cotton bra pads to absorb leaked breast milk. Leaking of breast milk between feedings is normal.  Use lanolin on your nipples after breastfeeding. Lanolin helps to maintain your skin's normal moisture barrier. If you use pure lanolin, you do not need to wash it off before feeding your baby again. Pure lanolin is not toxic to your baby. You may also hand express a few drops of breast milk and gently massage that milk into your nipples and allow the milk to air dry. In the first few weeks after  giving birth, some women experience extremely full breasts (engorgement). Engorgement can make your breasts feel heavy, warm, and tender to the touch. Engorgement peaks within 3-5 days after you give birth. The following recommendations can help ease engorgement:  Completely empty your breasts while breastfeeding or pumping. You may want to start by applying warm, moist heat (in the shower or with warm water-soaked hand towels) just before feeding or pumping. This increases circulation and helps the milk flow. If your baby does not completely empty your breasts while breastfeeding, pump any extra milk after he or she is finished.  Wear a snug bra (nursing or regular) or tank top for 1-2 days to signal your body to slightly decrease milk production.  Apply ice packs to your breasts, unless this is too uncomfortable for you.  Make sure that your baby is latched on and positioned properly while breastfeeding. If engorgement persists after 48 hours of following these recommendations, contact your health care provider or a lactation consultant. OVERALL HEALTH CARE RECOMMENDATIONS WHILE BREASTFEEDING  Eat healthy foods. Alternate between meals and snacks, eating 3 of each per day. Because what you eat affects your breast milk, some of the foods   may make your baby more irritable than usual. Avoid eating these foods if you are sure that they are negatively affecting your baby.  Drink milk, fruit juice, and water to satisfy your thirst (about 10 glasses a day).  Rest often, relax, and continue to take your prenatal vitamins to prevent fatigue, stress, and anemia.  Continue breast self-awareness checks.  Avoid chewing and smoking tobacco. Chemicals from cigarettes that pass into breast milk and exposure to secondhand smoke may harm your baby.  Avoid alcohol and drug use, including marijuana. Some medicines that may be harmful to your baby can pass through breast milk. It is important to ask your health  care provider before taking any medicine, including all over-the-counter and prescription medicine as well as vitamin and herbal supplements. It is possible to become pregnant while breastfeeding. If birth control is desired, ask your health care provider about options that will be safe for your baby. SEEK MEDICAL CARE IF:  You feel like you want to stop breastfeeding or have become frustrated with breastfeeding.  You have painful breasts or nipples.  Your nipples are cracked or bleeding.  Your breasts are red, tender, or warm.  You have a swollen area on either breast.  You have a fever or chills.  You have nausea or vomiting.  You have drainage other than breast milk from your nipples.  Your breasts do not become full before feedings by the fifth day after you give birth.  You feel sad and depressed.  Your baby is too sleepy to eat well.  Your baby is having trouble sleeping.   Your baby is wetting less than 3 diapers in a 24-hour period.  Your baby has less than 3 stools in a 24-hour period.  Your baby's skin or the white part of his or her eyes becomes yellow.   Your baby is not gaining weight by 5 days of age. SEEK IMMEDIATE MEDICAL CARE IF:  Your baby is overly tired (lethargic) and does not want to wake up and feed.  Your baby develops an unexplained fever.   This information is not intended to replace advice given to you by your health care provider. Make sure you discuss any questions you have with your health care provider.   Document Released: 09/01/2005 Document Revised: 05/23/2015 Document Reviewed: 02/23/2013 [employer] Interactive Patient Education 2016 [employer]  

## 2015-12-17 NOTE — Progress Notes (Signed)
Subjective:    Heidi Woods is a 35 y.o. [id number] at 3829w4d by LMP being seen today for her first obstetrical visit.  Her obstetrical history is significant for advanced maternal age and three term SVDs. Patient does intend to breast feed. Pregnancy history fully reviewed.  Patient reports occasional nausea and vomiting.  No other complaints.  Filed Vitals:   12/17/15 1521 12/17/15 1522  BP: 121/73   Pulse: 109   Height:  5' 8.5" (1.74 m)  Weight: 165 lb (74.844 kg)     HISTORY: OB History  Gravida Para Term Preterm AB SAB TAB Ectopic Multiple Living  13 3 3  0 9 2 7  0 0 3    # Outcome Date GA Lbr Len/2nd Weight Sex Delivery Anes PTL Lv  13 Current           12 Term 09/24/13 4338w2d 06:00 / 02:34 8 lb 9.6 oz (3.9 kg) Genella MechM Vag-Spont EPI  Y  11 Term 2007   6 lb 2 oz (2.778 kg) M Vag-Spont     10 Term 2004   7 lb 6 oz (3.345 kg) F Vag-Spont     9 TAB           8 TAB           7 TAB           6 TAB           5 TAB           4 TAB           3 TAB           2 SAB           1 SAB              Past Medical History  Diagnosis Date  . Anemia   . Allergy     seasonal  . LGSIL (low grade squamous intraepithelial lesion) on Pap smear 04/05/2013  . Headache(784.0)   . Ovarian cyst   . Abnormal Pap smear of cervix 2004    colpo, ok since  . Infection     UTI  . Sickle cell trait [facility](HCC)    Past Surgical History  Procedure Laterality Date  . Cholecystectomy  2012  . Induced abortion      x 7  . Umbbilical hernia repair     Family History  Problem Relation Age of Onset  . Sickle cell anemia Father   . Cancer Father     prostate  . Cancer Maternal Grandfather     prostate  . Hypertension Maternal Grandfather   . Cancer Paternal Grandfather     throat, lung  . Hypertension Maternal Grandmother   . Cancer Maternal Grandmother     colon     Exam    Uterus:     Pelvic Exam:    Perineum: No Hemorrhoids, Normal Perineum   Vulva: normal   Vagina:  normal mucosa,  normal discharge   Cervix: multiparous appearance, no bleeding following Pap, no cervical motion tenderness and no lesions   Adnexa: normal adnexa and no mass, fullness, tenderness   Bony Pelvis: gynecoid  System: Breast:  not evaluated   Skin: normal coloration and turgor, no rashes   Neurologic: oriented, normal, negative, normal mood   Extremities: normal strength, tone, and muscle mass, no deformities   HEENT PERRLA, extra ocular movement intact and sclera clear, anicteric   Mouth/Teeth mucous membranes  moist, pharynx normal without lesions and dental hygiene good   Neck supple and no masses   Cardiovascular: regular rate and rhythm   Respiratory:  appears well, vitals normal, no respiratory distress, acyanotic, normal RR, chest clear, no wheezing, crepitations, rhonchi, normal symmetric air entry   Abdomen: soft, non-tender; bowel sounds normal; no masses,  no organomegaly   Urinary: urethral meatus normal   Clinic scan:  Viable single IUP on ultrasound  FHR 160, CRL [redacted]w[redacted]d consistent with LMP   Assessment:    Pregnancy: [id number] Patient Active Problem List   Diagnosis Date Noted  . Sickle cell trait (HCC) 09/24/2013  . Supervision of normal pregnancy 04/05/2013      Plan:   Initial labs drawn. Continue prenatal vitamins.  Diclegis samples given for nausea and vomiting Problem list reviewed and updated. Genetic Screening discussed especially given AMA; First Screen and Integrated Screen: ordered. Ultrasound discussed; fetal survey: to be ordered later. She is a low risk patient and will be offered Babyscripts. Follow up in 4 weeks.   Routine obstetric precautions reviewed.   Tereso Newcomer, MD 12/17/2015

## 2015-12-17 NOTE — Progress Notes (Signed)
Transvaginal US performed, Single IUP noted with +FHR = 163/min.  CRL measured 7w 6d which was consistent with LMP.

## 2015-12-18 ENCOUNTER — Telehealth: Payer: Self-pay | Admitting: *Deleted

## 2015-12-18 ENCOUNTER — Encounter: Payer: Self-pay | Admitting: Obstetrics & Gynecology

## 2015-12-18 ENCOUNTER — Encounter (INDEPENDENT_AMBULATORY_CARE_PROVIDER_SITE_OTHER): Payer: Commercial Managed Care - HMO | Admitting: *Deleted

## 2015-12-18 DIAGNOSIS — O99019 Anemia complicating pregnancy, unspecified trimester: Secondary | ICD-10-CM | POA: Insufficient documentation

## 2015-12-18 DIAGNOSIS — Z3481 Encounter for supervision of other normal pregnancy, first trimester: Secondary | ICD-10-CM

## 2015-12-18 LAB — PRENATAL PROFILE (SOLSTAS)
Antibody Screen: NEGATIVE
BASOS ABS: 0 {cells}/uL (ref 0–200)
Basophils Relative: 0 %
EOS PCT: 0 %
Eosinophils Absolute: 0 cells/uL — ABNORMAL LOW (ref 15–500)
HEMATOCRIT: 22.1 % — AB (ref 35.0–45.0)
HEMOGLOBIN: 7.2 g/dL — AB (ref 11.7–15.5)
HEP B S AG: NEGATIVE
HIV: NONREACTIVE
LYMPHS ABS: 1700 {cells}/uL (ref 850–3900)
Lymphocytes Relative: 25 %
MCH: 20 pg — AB (ref 27.0–33.0)
MCHC: 32.6 g/dL (ref 32.0–36.0)
MCV: 61.4 fL — ABNORMAL LOW (ref 80.0–100.0)
MONO ABS: 680 {cells}/uL (ref 200–950)
Monocytes Relative: 10 %
NEUTROS ABS: 4420 {cells}/uL (ref 1500–7800)
Neutrophils Relative %: 65 %
Platelets: 203 10*3/uL (ref 140–400)
RBC: 3.6 MIL/uL — AB (ref 3.80–5.10)
RDW: 23.4 % — ABNORMAL HIGH (ref 11.0–15.0)
RUBELLA: 6.41 {index} — AB (ref <0.90)
Rh Type: POSITIVE
WBC: 6.8 10*3/uL (ref 3.8–10.8)

## 2015-12-18 LAB — CYTOLOGY - PAP

## 2015-12-18 LAB — GC/CHLAMYDIA PROBE AMP (~~LOC~~) NOT AT ARMC
Chlamydia: NEGATIVE
Neisseria Gonorrhea: NEGATIVE

## 2015-12-18 MED ORDER — FERROUS SULFATE 325 (65 FE) MG PO TABS: 325.0000 mg | ORAL_TABLET | Freq: Two times a day (BID) | ORAL | Status: DC

## 2015-12-18 NOTE — Telephone Encounter (Signed)
-----   Message from Tereso NewcomerUgonna A Anyanwu, MD sent at 12/18/2015  9:22 AM EDT ----- Iron therapy prescribed for patient. Please call to inform patient of results and advise her to pick up prescription.

## 2015-12-18 NOTE — Addendum Note (Signed)
Addended by: Jaynie CollinsANYANWU, UGONNA A on: 12/18/2015 09:22 AM   Modules accepted: Orders

## 2015-12-18 NOTE — Telephone Encounter (Signed)
Informed pt of Prenatal Labs and low Hgb.  Instructed pt to start Iron supplement which was called into her pharmacy.  Pt acknowledged instructions.

## 2015-12-20 LAB — CULTURE, OB URINE: Colony Count: >=100000

## 2015-12-24 ENCOUNTER — Telehealth: Payer: Self-pay | Admitting: *Deleted

## 2015-12-24 DIAGNOSIS — O2341 Unspecified infection of urinary tract in pregnancy, first trimester: Secondary | ICD-10-CM

## 2015-12-24 MED ORDER — AMOXICILLIN 500 MG PO CAPS: 500.0000 mg | ORAL_CAPSULE | Freq: Three times a day (TID) | ORAL | Status: DC

## 2015-12-24 NOTE — Telephone Encounter (Signed)
Informed pt of pap result and recommendation for colpo, pt has appt on 01-14-16 at 330.  Also informed her of urine cx which was + for ecoli so treatment was recommended with an antibiotic.  Amoxicillin rx sent to pharmacy.  Instructed pt on medication use.  Pt acknowledged instructions.

## 2015-12-24 NOTE — Telephone Encounter (Signed)
-----   Message from Tereso NewcomerUgonna A Anyanwu, MD sent at 12/24/2015  7:57 AM EDT ----- Please schedule patient for colposcopy for LGSIL pap done on 12/17/2015, can add to prenatal visit. Please call to inform patient of results and need for appointment.

## 2015-12-24 NOTE — Telephone Encounter (Signed)
error 

## 2016-01-14 ENCOUNTER — Encounter: Payer: Commercial Managed Care - HMO | Admitting: Obstetrics & Gynecology

## 2016-01-24 ENCOUNTER — Encounter (HOSPITAL_COMMUNITY): Payer: Self-pay

## 2016-01-24 ENCOUNTER — Encounter: Payer: Self-pay | Admitting: *Deleted

## 2016-01-24 ENCOUNTER — Encounter: Payer: Self-pay | Admitting: Family Medicine

## 2016-01-24 ENCOUNTER — Ambulatory Visit (HOSPITAL_COMMUNITY): Admission: RE | Admit: 2016-01-24 | Payer: Commercial Managed Care - HMO | Source: Ambulatory Visit

## 2016-01-24 ENCOUNTER — Telehealth: Payer: Self-pay | Admitting: *Deleted

## 2016-01-24 ENCOUNTER — Ambulatory Visit (HOSPITAL_COMMUNITY)
Admission: RE | Admit: 2016-01-24 | Discharge: 2016-01-24 | Disposition: A | Discharge status: Home / Self Care | Payer: Commercial Managed Care - HMO | Source: Ambulatory Visit | Attending: Obstetrics & Gynecology | Admitting: Obstetrics & Gynecology

## 2016-01-24 ENCOUNTER — Ambulatory Visit (INDEPENDENT_AMBULATORY_CARE_PROVIDER_SITE_OTHER): Payer: Commercial Managed Care - HMO | Admitting: Family Medicine

## 2016-01-24 ENCOUNTER — Other Ambulatory Visit: Payer: Self-pay | Admitting: Obstetrics & Gynecology

## 2016-01-24 VITALS — BP 124/70 | HR 96 | Wt 176.0 lb

## 2016-01-24 VITALS — BP 129/72 | HR 109 | Wt 175.0 lb

## 2016-01-24 DIAGNOSIS — Z3A13 13 weeks gestation of pregnancy: Secondary | ICD-10-CM

## 2016-01-24 DIAGNOSIS — R87612 Low grade squamous intraepithelial lesion on cytologic smear of cervix (LGSIL): Secondary | ICD-10-CM

## 2016-01-24 DIAGNOSIS — Z3482 Encounter for supervision of other normal pregnancy, second trimester: Secondary | ICD-10-CM

## 2016-01-24 DIAGNOSIS — O09521 Supervision of elderly multigravida, first trimester: Secondary | ICD-10-CM

## 2016-01-24 DIAGNOSIS — Z3492 Encounter for supervision of normal pregnancy, unspecified, second trimester: Secondary | ICD-10-CM

## 2016-01-24 DIAGNOSIS — O99011 Anemia complicating pregnancy, first trimester: Secondary | ICD-10-CM

## 2016-01-24 DIAGNOSIS — Z369 Encounter for antenatal screening, unspecified: Secondary | ICD-10-CM

## 2016-01-24 DIAGNOSIS — O09529 Supervision of elderly multigravida, unspecified trimester: Secondary | ICD-10-CM | POA: Insufficient documentation

## 2016-01-24 DIAGNOSIS — O283 Abnormal ultrasonic finding on antenatal screening of mother: Secondary | ICD-10-CM

## 2016-01-24 DIAGNOSIS — D573 Sickle-cell trait: Secondary | ICD-10-CM

## 2016-01-24 DIAGNOSIS — O99019 Anemia complicating pregnancy, unspecified trimester: Secondary | ICD-10-CM

## 2016-01-24 DIAGNOSIS — O09522 Supervision of elderly multigravida, second trimester: Secondary | ICD-10-CM

## 2016-01-24 NOTE — Patient Instructions (Addendum)
Colposcopy Colposcopy is a procedure to examine your cervix and vagina, or the area around the outside of your vagina, for abnormalities or signs of disease. The procedure is done using a lighted microscope called a colposcope. Tissue samples may be collected during the colposcopy if your health care provider finds any unusual cells. A colposcopy may be done if a woman has:  An abnormal Pap test. A Pap test is a medical test done to evaluate cells that are on the surface of the cervix.  A Pap test result that is suggestive of human papillomavirus (HPV). This virus can cause genital warts and is linked to the development of cervical cancer.  A sore on her cervix and the results of a Pap test were normal.  Genital warts on the cervix or in or around the outside of the vagina.  A mother who took the drug diethylstilbestrol (DES) while pregnant.  Painful intercourse.  Vaginal bleeding, especially after sexual intercourse. LET [facility] CARE PROVIDER KNOW ABOUT:  Any allergies you have.  All medicines you are taking, including vitamins, herbs, eye drops, creams, and over-the-counter medicines.  Previous problems you or members of your family have had with the use of anesthetics.  Any blood disorders you have.  Previous surgeries you have had.  Medical conditions you have. RISKS AND COMPLICATIONS Generally, a colposcopy is a safe procedure. However, as with any procedure, complications can occur. Possible complications include:  Bleeding.  Infection.  Missed lesions. BEFORE THE PROCEDURE   Tell your health care provider if you have your menstrual period. A colposcopy typically is not done during menstruation.  For 24 hours before the colposcopy, do not:  [place].  Use tampons.  Use medicines, creams, or suppositories in the vagina.  Have sexual intercourse. PROCEDURE  During the procedure, you will be lying on your back with your feet in foot rests (stirrups). A warm  metal or plastic instrument (speculum) will be placed in your vagina to keep it open and to allow the health care provider to see the cervix. The colposcope will be placed outside the vagina. It will be used to magnify and examine the cervix, vagina, and the area around the outside of the vagina. A small amount of liquid solution will be placed on the area that is to be viewed. This solution will make it easier to see the abnormal cells. Your health care provider will use tools to suck out mucus and cells from the canal of the cervix. Then he or she will record the location of the abnormal areas. If a biopsy is done during the procedure, a medicine will usually be given to numb the area (local anesthetic). You may feel mild pain or cramping while the biopsy is done. After the procedure, tissue samples collected during the biopsy will be sent to a lab for analysis. AFTER THE PROCEDURE  You will be given instructions on when to follow up with your health care provider for your test results. It is important to keep your appointment.   This information is not intended to replace advice given to you by your health care provider. Make sure you discuss any questions you have with your health care provider.   Document Released: 11/22/2002 Document Revised: 05/04/2013 Document Reviewed: 03/31/2013 Elsevier Interactive Patient Education 2016 [employer].  Second Trimester of Pregnancy The second trimester is from week 13 through week 28, months 4 through 6. The second trimester is often a time when you feel your best. Your body  has also adjusted to being pregnant, and you begin to feel better physically. Usually, morning sickness has lessened or quit completely, you may have more energy, and you may have an increase in appetite. The second trimester is also a time when the fetus is growing rapidly. At the end of the sixth month, the fetus is about 9 inches long and weighs about 1 pounds. You will likely begin to  feel the baby move (quickening) between 18 and 20 weeks of the pregnancy. BODY CHANGES Your body goes through many changes during pregnancy. The changes vary from woman to woman.   Your weight will continue to increase. You will notice your lower abdomen bulging out.  You may begin to get stretch marks on your hips, abdomen, and breasts.  You may develop headaches that can be relieved by medicines approved by your health care provider.  You may urinate more often because the fetus is pressing on your bladder.  You may develop or continue to have heartburn as a result of your pregnancy.  You may develop constipation because certain hormones are causing the muscles that push waste through your intestines to slow down.  You may develop hemorrhoids or swollen, bulging veins (varicose veins).  You may have back pain because of the weight gain and pregnancy hormones relaxing your joints between the bones in your pelvis and as a result of a shift in weight and the muscles that support your balance.  Your breasts will continue to grow and be tender.  Your gums may bleed and may be sensitive to brushing and flossing.  Dark spots or blotches (chloasma, mask of pregnancy) may develop on your face. This will likely fade after the baby is born.  A dark line from your belly button to the pubic area (linea nigra) may appear. This will likely fade after the baby is born.  You may have changes in your hair. These can include thickening of your hair, rapid growth, and changes in texture. Some women also have hair loss during or after pregnancy, or hair that feels dry or thin. Your hair will most likely return to normal after your baby is born. WHAT TO EXPECT AT YOUR PRENATAL VISITS During a routine prenatal visit:  You will be weighed to make sure you and the fetus are growing normally.  Your blood pressure will be taken.  Your abdomen will be measured to track your baby's growth.  The fetal  heartbeat will be listened to.  Any test results from the previous visit will be discussed. Your health care provider may ask you:  How you are feeling.  If you are feeling the baby move.  If you have had any abnormal symptoms, such as leaking fluid, bleeding, severe headaches, or abdominal cramping.  If you are using any tobacco products, including cigarettes, chewing tobacco, and electronic cigarettes.  If you have any questions. Other tests that may be performed during your second trimester include:  Blood tests that check for:  Low iron levels (anemia).  Gestational diabetes (between 24 and 28 weeks).  Rh antibodies.  Urine tests to check for infections, diabetes, or protein in the urine.  An ultrasound to confirm the proper growth and development of the baby.  An amniocentesis to check for possible genetic problems.  Fetal screens for spina bifida and Down syndrome.  HIV (human immunodeficiency virus) testing. Routine prenatal testing includes screening for HIV, unless you choose not to have this test. HOME CARE INSTRUCTIONS   Avoid all  smoking, herbs, alcohol, and unprescribed drugs. These chemicals affect the formation and growth of the baby.  Do not use any tobacco products, including cigarettes, chewing tobacco, and electronic cigarettes. If you need help quitting, ask your health care provider. You may receive counseling support and other resources to help you quit.  Follow your health care provider's instructions regarding medicine use. There are medicines that are either safe or unsafe to take during pregnancy.  Exercise only as directed by your health care provider. Experiencing uterine cramps is a good sign to stop exercising.  Continue to eat regular, healthy meals.  Wear a good support bra for breast tenderness.  Do not use hot tubs, steam rooms, or saunas.  Wear your seat belt at all times when driving.  Avoid raw meat, uncooked cheese, cat litter  boxes, and soil used by cats. These carry germs that can cause birth defects in the baby.  Take your prenatal vitamins.  Take 1500-2000 mg of calcium daily starting at the 20th week of pregnancy until you deliver your baby.  Try taking a stool softener (if your health care provider approves) if you develop constipation. Eat more high-fiber foods, such as fresh vegetables or fruit and whole grains. Drink plenty of fluids to keep your urine clear or pale yellow.  Take warm sitz baths to soothe any pain or discomfort caused by hemorrhoids. Use hemorrhoid cream if your health care provider approves.  If you develop varicose veins, wear support hose. Elevate your feet for 15 minutes, 3-4 times a day. Limit salt in your diet.  Avoid heavy lifting, wear low heel shoes, and practice good posture.  Rest with your legs elevated if you have leg cramps or low back pain.  Visit your dentist if you have not gone yet during your pregnancy. Use a soft toothbrush to brush your teeth and be gentle when you floss.  A sexual relationship may be continued unless your health care provider directs you otherwise.  Continue to go to all your prenatal visits as directed by your health care provider. SEEK MEDICAL CARE IF:   You have dizziness.  You have mild pelvic cramps, pelvic pressure, or nagging pain in the abdominal area.  You have persistent nausea, vomiting, or diarrhea.  You have a bad smelling vaginal discharge.  You have pain with urination. SEEK IMMEDIATE MEDICAL CARE IF:   You have a fever.  You are leaking fluid from your vagina.  You have spotting or bleeding from your vagina.  You have severe abdominal cramping or pain.  You have rapid weight gain or loss.  You have shortness of breath with chest pain.  You notice sudden or extreme swelling of your face, hands, ankles, feet, or legs.  You have not felt your baby move in over an hour.  You have severe headaches that do not go  away with medicine.  You have vision changes.   This information is not intended to replace advice given to you by your health care provider. Make sure you discuss any questions you have with your health care provider.   Document Released: 08/26/2001 Document Revised: 09/22/2014 Document Reviewed: 11/02/2012 Elsevier Interactive Patient Education [employer]  Breastfeeding Deciding to breastfeed is one of the best choices you can make for you and your baby. A change in hormones during pregnancy causes your breast tissue to grow and increases the number and size of your milk ducts. These hormones also allow proteins, sugars, and fats from your blood supply  to make breast milk in your milk-producing glands. Hormones prevent breast milk from being released before your baby is born as well as prompt milk flow after birth. Once breastfeeding has begun, thoughts of your baby, as well as his or her sucking or crying, can stimulate the release of milk from your milk-producing glands.  BENEFITS OF BREASTFEEDING For Your Baby  Your first milk (colostrum) helps your baby's digestive system function better.  There are antibodies in your milk that help your baby fight off infections.  Your baby has a lower incidence of asthma, allergies, and sudden infant death syndrome.  The nutrients in breast milk are better for your baby than infant formulas and are designed uniquely for your baby's needs.  Breast milk improves your baby's brain development.  Your baby is less likely to develop other conditions, such as childhood obesity, asthma, or type 2 diabetes mellitus. For You  Breastfeeding helps to create a very special Schoenberger between you and your baby.  Breastfeeding is convenient. Breast milk is always available at the correct temperature and costs nothing.  Breastfeeding helps to burn calories and helps you lose the weight gained during pregnancy.  Breastfeeding makes your uterus contract to  its prepregnancy size faster and slows bleeding (lochia) after you give birth.   Breastfeeding helps to lower your risk of developing type 2 diabetes mellitus, osteoporosis, and breast or ovarian cancer later in life. SIGNS THAT YOUR BABY IS HUNGRY Early Signs of Hunger  Increased alertness or activity.  Stretching.  Movement of the head from side to side.  Movement of the head and opening of the mouth when the corner of the mouth or cheek is stroked (rooting).  Increased sucking sounds, smacking lips, cooing, sighing, or squeaking.  Hand-to-mouth movements.  Increased sucking of fingers or hands. Late Signs of Hunger  Fussing.  Intermittent crying. Extreme Signs of Hunger Signs of extreme hunger will require calming and consoling before your baby will be able to breastfeed successfully. Do not wait for the following signs of extreme hunger to occur before you initiate breastfeeding:  Restlessness.  A loud, strong cry.  Screaming. BREASTFEEDING BASICS Breastfeeding Initiation  Find a comfortable place to sit or lie down, with your neck and back well supported.  Place a pillow or rolled up blanket under your baby to bring him or her to the level of your breast (if you are seated). Nursing pillows are specially designed to help support your arms and your baby while you breastfeed.  Make sure that your baby's abdomen is facing your abdomen.  Gently massage your breast. With your fingertips, massage from your chest wall toward your nipple in a circular motion. This encourages milk flow. You may need to continue this action during the feeding if your milk flows slowly.  Support your breast with 4 fingers underneath and your thumb above your nipple. Make sure your fingers are well away from your nipple and your baby's mouth.  Stroke your baby's lips gently with your finger or nipple.  When your baby's mouth is open wide enough, quickly bring your baby to your breast, placing  your entire nipple and as much of the colored area around your nipple (areola) as possible into your baby's mouth.  More areola should be visible above your baby's upper lip than below the lower lip.  Your baby's tongue should be between his or her lower gum and your breast.  Ensure that your baby's mouth is correctly positioned around your nipple (latched).  Your baby's lips should create a seal on your breast and be turned out (everted).  It is common for your baby to suck about 2-3 minutes in order to start the flow of breast milk. Latching Teaching your baby how to latch on to your breast properly is very important. An improper latch can cause nipple pain and decreased milk supply for you and poor weight gain in your baby. Also, if your baby is not latched onto your nipple properly, he or she may swallow some air during feeding. This can make your baby fussy. Burping your baby when you switch breasts during the feeding can help to get rid of the air. However, teaching your baby to latch on properly is still the best way to prevent fussiness from swallowing air while breastfeeding. Signs that your baby has successfully latched on to your nipple:  Silent tugging or silent sucking, without causing you pain.  Swallowing heard between every 3-4 sucks.  Muscle movement above and in front of his or her ears while sucking. Signs that your baby has not successfully latched on to nipple:  Sucking sounds or smacking sounds from your baby while breastfeeding.  Nipple pain. If you think your baby has not latched on correctly, slip your finger into the corner of your baby's mouth to break the suction and place it between your baby's gums. Attempt breastfeeding initiation again. Signs of Successful Breastfeeding Signs from your baby:  A gradual decrease in the number of sucks or complete cessation of sucking.  Falling asleep.  Relaxation of his or her body.  Retention of a small amount of milk  in his or her mouth.  Letting go of your breast by himself or herself. Signs from you:  Breasts that have increased in firmness, weight, and size 1-3 hours after feeding.  Breasts that are softer immediately after breastfeeding.  Increased milk volume, as well as a change in milk consistency and color by the fifth day of breastfeeding.  Nipples that are not sore, cracked, or bleeding. Signs That Your Pecola Leisure is Getting Enough Milk  Wetting at least 3 diapers in a 24-hour period. The urine should be clear and pale yellow by age 802 days.  At least 3 stools in a 24-hour period by age 802 days. The stool should be soft and yellow.  At least 3 stools in a 24-hour period by age 80 days. The stool should be seedy and yellow.  No loss of weight greater than 10% of birth weight during the first 51 days of age.  Average weight gain of 4-7 ounces (113-198 g) per week after age 43 days.  Consistent daily weight gain by age 802 days, without weight loss after the age of 2 weeks. After a feeding, your baby may spit up a small amount. This is common. BREASTFEEDING FREQUENCY AND DURATION Frequent feeding will help you make more milk and can prevent sore nipples and breast engorgement. Breastfeed when you feel the need to reduce the fullness of your breasts or when your baby shows signs of hunger. This is called "breastfeeding on demand." Avoid introducing a pacifier to your baby while you are working to establish breastfeeding (the first 4-6 weeks after your baby is born). After this time you may choose to use a pacifier. Research has shown that pacifier use during the first year of a baby's life decreases the risk of sudden infant death syndrome (SIDS). Allow your baby to feed on each breast as long as he or she  wants. Breastfeed until your baby is finished feeding. When your baby unlatches or falls asleep while feeding from the first breast, offer the second breast. Because newborns are often sleepy in the first  few weeks of life, you may need to awaken your baby to get him or her to feed. Breastfeeding times will vary from baby to baby. However, the following rules can serve as a guide to help you ensure that your baby is properly fed:  Newborns (babies 18 weeks of age or younger) may breastfeed every 1-3 hours.  Newborns should not go longer than 3 hours during the day or 5 hours during the night without breastfeeding.  You should breastfeed your baby a minimum of 8 times in a 24-hour period until you begin to introduce solid foods to your baby at around 62 months of age. BREAST MILK PUMPING Pumping and storing breast milk allows you to ensure that your baby is exclusively fed your breast milk, even at times when you are unable to breastfeed. This is especially important if you are going back to work while you are still breastfeeding or when you are not able to be present during feedings. Your lactation consultant can give you guidelines on how long it is safe to store breast milk. A breast pump is a machine that allows you to pump milk from your breast into a sterile bottle. The pumped breast milk can then be stored in a refrigerator or freezer. Some breast pumps are operated by hand, while others use electricity. Ask your lactation consultant which type will work best for you. Breast pumps can be purchased, but some hospitals and breastfeeding support groups lease breast pumps on a monthly basis. A lactation consultant can teach you how to hand express breast milk, if you prefer not to use a pump. CARING FOR YOUR BREASTS WHILE YOU BREASTFEED Nipples can become dry, cracked, and sore while breastfeeding. The following recommendations can help keep your breasts moisturized and healthy:  Avoid using soap on your nipples.  Wear a supportive bra. Although not required, special nursing bras and tank tops are designed to allow access to your breasts for breastfeeding without taking off your entire bra or top.  Avoid wearing underwire-style bras or extremely tight bras.  Air dry your nipples for 3-56minutes after each feeding.  Use only cotton bra pads to absorb leaked breast milk. Leaking of breast milk between feedings is normal.  Use lanolin on your nipples after breastfeeding. Lanolin helps to maintain your skin's normal moisture barrier. If you use pure lanolin, you do not need to wash it off before feeding your baby again. Pure lanolin is not toxic to your baby. You may also hand express a few drops of breast milk and gently massage that milk into your nipples and allow the milk to air dry. In the first few weeks after giving birth, some women experience extremely full breasts (engorgement). Engorgement can make your breasts feel heavy, warm, and tender to the touch. Engorgement peaks within 3-5 days after you give birth. The following recommendations can help ease engorgement:  Completely empty your breasts while breastfeeding or pumping. You may want to start by applying warm, moist heat (in the shower or with warm water-soaked hand towels) just before feeding or pumping. This increases circulation and helps the milk flow. If your baby does not completely empty your breasts while breastfeeding, pump any extra milk after he or she is finished.  Wear a snug bra (nursing or regular) or tank  top for 1-2 days to signal your body to slightly decrease milk production.  Apply ice packs to your breasts, unless this is too uncomfortable for you.  Make sure that your baby is latched on and positioned properly while breastfeeding. If engorgement persists after 48 hours of following these recommendations, contact your health care provider or a Advertising copywriter. OVERALL HEALTH CARE RECOMMENDATIONS WHILE BREASTFEEDING  Eat healthy foods. Alternate between meals and snacks, eating 3 of each per day. Because what you eat affects your breast milk, some of the foods may make your baby more irritable than usual.  Avoid eating these foods if you are sure that they are negatively affecting your baby.  Drink milk, fruit juice, and water to satisfy your thirst (about 10 glasses a day).  Rest often, relax, and continue to take your prenatal vitamins to prevent fatigue, stress, and anemia.  Continue breast self-awareness checks.  Avoid chewing and smoking tobacco. Chemicals from cigarettes that pass into breast milk and exposure to secondhand smoke may harm your baby.  Avoid alcohol and drug use, including marijuana. Some medicines that may be harmful to your baby can pass through breast milk. It is important to ask your health care provider before taking any medicine, including all over-the-counter and prescription medicine as well as vitamin and herbal supplements. It is possible to become pregnant while breastfeeding. If birth control is desired, ask your health care provider about options that will be safe for your baby. SEEK MEDICAL CARE IF:  You feel like you want to stop breastfeeding or have become frustrated with breastfeeding.  You have painful breasts or nipples.  Your nipples are cracked or bleeding.  Your breasts are red, tender, or warm.  You have a swollen area on either breast.  You have a fever or chills.  You have nausea or vomiting.  You have drainage other than breast milk from your nipples.  Your breasts do not become full before feedings by the fifth day after you give birth.  You feel sad and depressed.  Your baby is too sleepy to eat well.  Your baby is having trouble sleeping.   Your baby is wetting less than 3 diapers in a 24-hour period.  Your baby has less than 3 stools in a 24-hour period.  Your baby's skin or the white part of his or her eyes becomes yellow.   Your baby is not gaining weight by 43 days of age. SEEK IMMEDIATE MEDICAL CARE IF:  Your baby is overly tired (lethargic) and does not want to wake up and feed.  Your baby develops an  unexplained fever.   This information is not intended to replace advice given to you by your health care provider. Make sure you discuss any questions you have with your health care provider.   Document Released: 09/01/2005 Document Revised: 05/23/2015 Document Reviewed: 02/23/2013 Elsevier Interactive Patient Education [employer]

## 2016-01-24 NOTE — Telephone Encounter (Signed)
Updated problem list

## 2016-01-24 NOTE — Progress Notes (Signed)
Subjective:  Heidi Woods is a 35 y.o. [id number] at 7471w0d being seen today for ongoing prenatal care.  She is currently monitored for the following issues for this low-risk pregnancy and has Supervision of normal pregnancy; Sickle cell trait (HCC); Advanced maternal age in multigravida; and Anemia affecting pregnancy on her problem list.  Patient reports no complaints.  Contractions: Not present. Vag. Bleeding: None.  Movement: Absent. Denies leaking of fluid.   The following portions of the patient's history were reviewed and updated as appropriate: allergies, current medications, past family history, past medical history, past social history, past surgical history and problem list. Problem list updated.  Objective:   Filed Vitals:   01/24/16 0914  BP: 129/72  Pulse: 109  Weight: 175 lb (79.379 kg)    Fetal Status: Fetal Heart Rate (bpm): 175   Movement: Absent     General:  Alert, oriented and cooperative. Patient is in no acute distress.  Skin: Skin is warm and dry. No rash noted.   Cardiovascular: Normal heart rate noted  Respiratory: Normal respiratory effort, no problems with respiration noted  Abdomen: Soft, gravid, appropriate for gestational age. Pain/Pressure: Present     Pelvic: Vag. Bleeding: None Vag D/C Character: Thin   Cervical exam performed        Extremities: Normal range of motion.  Edema: None  Mental Status: Normal mood and affect. Normal behavior. Normal judgment and thought content.   Urinalysis: Urine Protein: Trace Urine Glucose: Negative Procedure: Patient given informed consent, signed copy in the chart, time out was performed.  Placed in lithotomy position. Cervix viewed with speculum and colposcope after application of acetic acid.   Colposcopy adequate?  no Acetowhite lesions?yes Punctation?no Mosaicism?  no Abnormal vasculature?  no Biopsies?12, 5, 8 ECC?no  COMMENTS: Patient was given post procedure instructions.  She will be called with  results  Assessment and Plan:  Pregnancy: [id number] at 4671w0d  1. Supervision of normal pregnancy, second trimester Continue routine prenatal care.  2. Anemia affecting pregnancy Continue Iron  3. Advanced maternal age in multigravida, second trimester First screening today  4. Low grade squamous intraepithelial lesion (LGSIL) on Papanicolaou smear of cervix S/p colpo - Surgical pathology  General obstetric precautions including but not limited to vaginal bleeding, contractions, leaking of fluid and fetal movement were reviewed in detail with the patient. Please refer to After Visit Summary for other counseling recommendations.  Return in 4 weeks (on 02/21/2016).   Reva Boresanya S Nona Gracey, MD

## 2016-01-24 NOTE — Progress Notes (Signed)
Pt here today for routine OB f/u and Colpo.

## 2016-01-24 NOTE — Progress Notes (Signed)
Genetic Counseling  High-Risk Gestation Note  Appointment Date:  01/24/2016 Referred By: Tereso NewcomerAnyanwu, Ugonna A, MD Date of Birth:  03/29/1981   Pregnancy History: [id number] Estimated Date of Delivery: 07/31/16 Estimated Gestational Age: 2767w0d Attending: Particia NearingMartha Decker, MD  Heidi Woods was seen for genetic counseling because of a maternal age of 35 y.o. and increased nuchal translucency on ultrasound.     In summary:  Discussed AMA and associated risk for fetal aneuploidy  Increased nuchal translucency visualized today  Discussed various etiologies including risk for aneuploidy, congenital heart disease, single gene condition, and normal variation  Discussed options for screening  First screen and Quad screen- patient declined maternal serum screening  NIPS-declined today but may consider pending further discussion with father of the pregnancy  Ultrasound- detailed scheduled for 02/28/16  Fetal echocardiogram also available in second trimester given increased NT  Discussed diagnostic testing options- patient declined amniocentesis  Reviewed family history concerns  Patient's previous son with previous partner born with microphthalmia, otherwise healthy  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  Two of her children (with separate fathers) reportedly have hemoglobin O Arab trait  Discussed that Heidi Woods may carry this hemoglobin variant instead of hemoglobin C; both are beta globin chain variants  Father of the pregnancy has not had screening and has no known family history of hemoglobinopathies  Reviewed autosomal recessive inheritance of hemoglobinopathies  Hemoglobin electrophoresis is available to Heidi Woods to clarify the specific hemoglobin variant she carries, if desired (not performed today)  She was counseled regarding maternal age and the association with risk for chromosome conditions due to nondisjunction with aging of the ova.   We reviewed chromosomes,  nondisjunction, and the associated 1 in 114 risk for fetal aneuploidy related to a maternal age of 35 y.o. at 6967w0d gestation.  She was counseled that the risk for aneuploidy decreases as gestational age increases, accounting for those pregnancies which spontaneously abort.  We specifically discussed Down syndrome (trisomy 7221), trisomies 3813 and 5518, and sex chromosome aneuploidies (47,XXX and 47,XXY) including the common features and prognoses of each.   Nuchal translucency ultrasound performed today. NT measured 3.1 mm. We discussed that the fetal NT refers to a fluid filled space between the skin and soft tissues behind the cervical spine.  This space is traditionally measurable between 11 and 13.[redacted] weeks gestation and is considered enlarged at ~3 mm or greater.  Heidi Woods was counseled regarding the various common etiologies for an enlarged NT including: aneuploidy, single gene conditions, cardiac or great vessel abnormalities, lymphatic system failure, decreased fetal movement, and fetal anemia.    Considering Ms. Bonds age, an NT of 3.1 mm and a CRL of 72.6 mm, the risk for fetal aneuploidy is increased, with the risk for fetal Down syndrome specifically to be approximately 1 in 81.  In addition, we discussed that an NT of 3.1 is associated with an approximate 1% risk for a fetal cardiac anomaly.  We also discussed single gene conditions and that these conditions are not routinely tested for prenatally unless ultrasound findings or family history significantly increase the suspicion of a specific single gene disorder.  We briefly reviewed common inheritance patterns (dominant, recessive, and X-linked) as well as the associated risks of recurrence.     We reviewed available screening options including First Screen, Quad screen, noninvasive prenatal screening (NIPS)/cell free DNA (cfDNA) screening, and detailed ultrasound.  She was counseled that screening tests are used to modify a patient's a priori risk  for  aneuploidy, typically based on age. This estimate provides a pregnancy specific risk assessment. We reviewed the benefits and limitations of each option. Specifically, we discussed the conditions for which each test screens, the detection rates, and false positive rates of each. She was also counseled regarding diagnostic testing via amniocentesis. We reviewed the approximate 1 in 300-500 risk for complications from amniocentesis, including spontaneous pregnancy loss. We discussed the possible results that the tests might provide including: positive, negative, unanticipated, and no result. Finally, they were counseled regarding the cost of each option and potential out of pocket expenses. Heidi Woods declined amniocentesis. After consideration of all the options, she declined first screen and NIPS today. She plans to further consider NIPS, discuss with the father of the pregnancy, and may pursue NIPS at a later date.    She was counseled that the fetal prognosis depends on the underlying etiology of the enlarged NT and further anticipatory guidance can be provided if a diagnosis is discovered.  They understand that the legal limit for TAB in [place] is [redacted] weeks gestation. She understands that screening tests cannot rule out all birth defects or genetic syndromes. The patient was advised of this limitation and states she still does not want additional testing at this time. Detailed ultrasound is scheduled for 02/28/16. Fetal echocardiogram will be available in the second trimester.   Both family histories were reviewed and found to be contributory for microphthalmia in the patient's older son, with a different father from the current pregnancy. He has no vision in his left eye. He is almost 35 years old and is otherwise healthy. Microphthalmia can be due to a wide range of causes including teratogenic exposures, genetic, and multifactorial. In cases of underlying genetic causes, autosomal recessive, autosomal dominant,  and X-linked forms of inheritance are reported. Given the reported family history and degree of relation, recurrence risk would likely be low for the current pregnancy. However, additional information may alter recurrence risk estimate. The patient understands that in the absence of an identified etiology, prenatal screening or testing may not likely be informative for microphthalmia.   Additionally, Heidi Woods reported that her father has sickle cell anemia, and thus, she and all her siblings have trait. Specifically, Heidi Woods reported that she has hemoglobin C trait.  However she reported that her daughter and her younger son (different fathers from each other) have hemoglobin O Arab. We discussed that hemoglobin is the oxygen-carrying pigment of red blood cells.  The type of hemoglobin we have is determined by inheritance.  Most people typically inherit hemoglobin A.  Other variant types of hemoglobin include hemoglobin C or hemoglobin S.  If an individual inherits hemoglobin A from one parent and hemoglobin C from the other parent, that individual has hemoglobin C trait (noted as hemoglobin AC).  Likewise, an individual who inherits hemoglobin A from one parent and hemoglobin S from the other parent has hemoglobin S trait (sickle cell trait) (hemoglobin AS).  Hemoglobin C trait and sickle cell trait are not associated with any illness and there are generally no symptoms. Hemoglobin O-Arab is a much less common beta globin chain variant. HbO Arab can appear similar to Hb C on alkaline electrophoresis. We discussed that Heidi Woods may also have Hb O Arab (instead of Hb C) given that two of her children with different fathers have this variant. We discussed the option of hemoglobin electrophoresis to clarify her carrier status, if desired. This was not performed at the time of  today's visit.   We discussed the autosomal recessive inheritance of hemoglobinopathies, and that it occurs when both copies of the  hemoglobin gene are changed and produce an abnormal hemoglobin. Typically, one abnormal gene for the production of hemoglobin S is inherited from each parent. Both the mother and the father of the baby must carry a variant hemoglobin such as hemoglobin S or C in order for a pregnancy to be at increased risk for a disease such as sickle cell disease (hemoglobin SS) or hemoglobin C disease (hemoglobin CC) in this pregnancy. Individuals with sickle cell disease (sickle cell anemia) have red blood cells that are not able to carry enough oxygen to all the tissues of the body, which may result in sickle cell crises, delays in growth and development, repeated infections, or hospitalization for blood transfusions and pain management. Given the recessive inheritance, we discussed the importance of understanding Mr. Montelongo carrier status in order to accurately predict the risk of a hemoglobinopathy in the fetus. The patient reported that he has not had screening for sickle cell trait, to her knowledge, and has no known relatives with sickle cell anemia or sickle cell trait. Prior to carrier screening, he would have the general population chance of approximately 1 in 12 to have sickle cell trait (Hb S), meaning that the pregnancy has an approximate 1 in 48 chance for hemoglobin [place] (or S-O Arab) disease. When both parents are identified carriers, prenatal diagnosis via amniocentesis would be available. Heidi Woods indicated that she would not be interested in amniocentesis in the pregnancy. Screening is available to the father of the pregnancy, if desired. They understand that an accurate risk assessment cannot be performed without further information. We also reviewed the availability of newborn screening in [place] for hemoglobinopathies. Without further information regarding the provided family history, an accurate genetic risk cannot be calculated. Further genetic counseling is warranted if more information is  obtained.  Heidi Woods denied exposure to environmental toxins or chemical agents. She denied the use of alcohol, tobacco or street drugs. She denied significant viral illnesses during the course of her pregnancy. Her medical and surgical histories were noncontributory.   I counseled Heidi Woods regarding the above risks and available options.  The approximate face-to-face time with the genetic counselor was 45 minutes.  Quinn Plowman, MS,  Certified Genetic Counselor 01/24/2016

## 2016-01-25 ENCOUNTER — Telehealth (HOSPITAL_COMMUNITY): Payer: Self-pay | Admitting: MS"

## 2016-01-25 NOTE — Telephone Encounter (Signed)
Called patient with follow-up information about Hemoglobin O-Arab. This is a beta globin chain variant, similar to how Hb S or Hb C are beta globin chain variants and similarly can combine with other variants to cause features similar to sickle cell disease. There are reports that HbO-Arab and HbC migrate with the same motility on alkaline electrophoresis. Patient was told previously that she has Hb C trait. Discussed that depending on how this was determined, it may be because she carries HbO-Arab instead, particularly given that two of her children with different fathers have O-Arab and the patient is apparently not symptomatic. Reviewed that hemoglobin electrophoresis is available to her to help clarify her carrier status, if desired. Patient is interested in this and plans to inquire at her next OB visit.   Heidi Woods 01/25/2016

## 2016-01-28 ENCOUNTER — Telehealth: Payer: Self-pay | Admitting: *Deleted

## 2016-01-28 ENCOUNTER — Encounter: Payer: Self-pay | Admitting: Family Medicine

## 2016-01-28 DIAGNOSIS — N870 Mild cervical dysplasia: Secondary | ICD-10-CM | POA: Insufficient documentation

## 2016-01-28 DIAGNOSIS — O283 Abnormal ultrasonic finding on antenatal screening of mother: Secondary | ICD-10-CM | POA: Insufficient documentation

## 2016-01-28 NOTE — Telephone Encounter (Signed)
Called pt back - LMOM for her to call back if needed

## 2016-01-28 NOTE — Telephone Encounter (Signed)
-----   Message from Olevia BowensJacinda S Battle sent at 01/28/2016  8:22 AM EDT ----- Regarding: Question About Colpo Contact: [phone] After colpo on 5/11, having some spotting and still a lot of cramping, wants to know if this is normal

## 2016-01-28 NOTE — Addendum Note (Signed)
Encounter addended by: Dessie ComaKaren Louise Ech Jasma Seevers on: 01/28/2016  9:20 AM<BR>     Documentation filed: Charges VN

## 2016-02-18 ENCOUNTER — Ambulatory Visit (INDEPENDENT_AMBULATORY_CARE_PROVIDER_SITE_OTHER): Payer: Commercial Managed Care - HMO | Admitting: Obstetrics & Gynecology

## 2016-02-18 ENCOUNTER — Encounter: Payer: Self-pay | Admitting: *Deleted

## 2016-02-18 VITALS — BP 120/74 | HR 108 | Wt 176.0 lb

## 2016-02-18 DIAGNOSIS — Z3492 Encounter for supervision of normal pregnancy, unspecified, second trimester: Secondary | ICD-10-CM | POA: Diagnosis not present

## 2016-02-18 DIAGNOSIS — D649 Anemia, unspecified: Secondary | ICD-10-CM

## 2016-02-18 DIAGNOSIS — R000 Tachycardia, unspecified: Secondary | ICD-10-CM

## 2016-02-18 DIAGNOSIS — O283 Abnormal ultrasonic finding on antenatal screening of mother: Secondary | ICD-10-CM

## 2016-02-18 DIAGNOSIS — O288 Other abnormal findings on antenatal screening of mother: Secondary | ICD-10-CM

## 2016-02-18 DIAGNOSIS — O09522 Supervision of elderly multigravida, second trimester: Secondary | ICD-10-CM

## 2016-02-18 DIAGNOSIS — O99019 Anemia complicating pregnancy, unspecified trimester: Secondary | ICD-10-CM

## 2016-02-18 LAB — CBC
HEMATOCRIT: 21.7 % — AB (ref 35.0–45.0)
HEMOGLOBIN: 7 g/dL — AB (ref 11.7–15.5)
MCH: 20.7 pg — ABNORMAL LOW (ref 27.0–33.0)
MCHC: 32.3 g/dL (ref 32.0–36.0)
MCV: 64.2 fL — AB (ref 80.0–100.0)
Platelets: 193 10*3/uL (ref 140–400)
RBC: 3.38 MIL/uL — ABNORMAL LOW (ref 3.80–5.10)
RDW: 23.7 % — AB (ref 11.0–15.0)
WBC: 12.2 10*3/uL — ABNORMAL HIGH (ref 3.8–10.8)

## 2016-02-18 NOTE — Patient Instructions (Signed)
Return to clinic for any scheduled appointments or obstetric concerns, or go to MAU for evaluation  

## 2016-02-18 NOTE — Progress Notes (Signed)
Subjective:  Heidi Woods is a 35 y.o. [id number] at 3646w4d being seen today for ongoing prenatal care.  She is currently monitored for the following issues for this low-risk pregnancy and has Supervision of normal pregnancy; Sickle cell trait (HCC); Advanced maternal age in multigravida; Anemia affecting pregnancy; Increased nuchal translucency space on fetal ultrasound; and Dysplasia of cervix, low grade (CIN 1) on her problem list.  Patient reports feeling dizzy occasionally.  Had hemoglobin of 7 two months ago, currently on oral iron.  Contractions: Not present. Vag. Bleeding: None.  Movement: Present. Denies leaking of fluid.   The following portions of the patient's history were reviewed and updated as appropriate: allergies, current medications, past family history, past medical history, past social history, past surgical history and problem list. Problem list updated.  Objective:   Filed Vitals:   02/18/16 1516  BP: 120/74  Pulse: 108  Weight: 176 lb (79.833 kg)    Fetal Status: Fetal Heart Rate (bpm): 160   Movement: Present     General:  Alert, oriented and cooperative. Patient is in no acute distress.  Skin: Skin is warm and dry. No rash noted.   Cardiovascular: Normal heart rate noted  Respiratory: Normal respiratory effort, no problems with respiration noted  Abdomen: Soft, gravid, appropriate for gestational age. Pain/Pressure: Absent     Pelvic: Vag. Bleeding: None Vag D/C Character: Thin  Cervical exam deferred        Extremities: Normal range of motion.  Edema: None  Mental Status: Normal mood and affect. Normal behavior. Normal judgment and thought content.   Urinalysis: Urine Protein: 2+ Urine Glucose: Negative  Assessment and Plan:  Pregnancy: [id number] at 1646w4d  1. Anemia affecting pregnancy - CBC rechecked today. May need to consider Ferraheme depending on the level.  2. Tachycardia - TSH - CBC  3. Increased nuchal translucency space on fetal ultrasound 4.  Advanced maternal age in multigravida, second trimester Still deciding about Panaroma  5. Supervision of normal pregnancy, second trimester - Hemoglobinopathy evaluation (not done with initial labs) No other complaints or concerns.  Routine obstetric precautions reviewed. Please refer to After Visit Summary for other counseling recommendations.  Return in about 4 weeks (around 03/17/2016) for OB Visit.   Tereso NewcomerUgonna A Meldrick Buttery, MD

## 2016-02-19 LAB — HEMOGLOBIN EVAL RFX ELECTROPHORESIS
HGB A2 QUANT: 0 % — AB (ref 1.8–3.5)
HGB A: 0 % — AB (ref >96.0)
HGB F QUANT: 0 % (ref <2.0)
Hgb S Quant: 0 %

## 2016-02-19 LAB — HEMOGLOBINOPATHY EVALUATION
HGB A: 0 % — AB (ref >96.0)
HGB F QUANT: 0 % (ref <2.0)
Hgb A2 Quant: 0 % — ABNORMAL LOW (ref 1.8–3.5)
Hgb S Quant: 0 %

## 2016-02-19 LAB — TSH: TSH: 1.47 mIU/L

## 2016-02-20 ENCOUNTER — Telehealth: Payer: Self-pay | Admitting: *Deleted

## 2016-02-20 NOTE — Progress Notes (Addendum)
Attending Lab Note CBC Latest Ref Rng 02/18/2016 12/17/2015 09/26/2013  WBC 3.8 - 10.8 K/uL 12.2(H) 6.8 10.7(H)  Hemoglobin 11.7 - 15.5 g/dL 7.0(L) 7.2(L) 6.2(LL)  Hematocrit 35.0 - 45.0 % 21.7(L) 22.1(L) 18.0(L)  Platelets 140 - 400 K/uL 193 203 161  Given persistent anemia despite oral iron therapy, Feraheme 510 mcg was ordered weekly x 2 weeks.  Patient will be contacted and will go to [facility]U for the infusions. Will also follow up hemoglobin electrophoresis results; also evaluate for other sources of anemia and these labs will be checked when patient goes for the infusion (see signed and held orders).  Jaynie CollinsUGONNA  Jasnoor Trussell, MD, FACOG Attending Obstetrician & Gynecologist, [facility]

## 2016-02-20 NOTE — Telephone Encounter (Signed)
Called pt to inform her of plan, no answer, left message to call the office.  Also called [facility]U about scheduling IV Iron infusion, stated that [facility], called and left VM to schedule appt for pt.

## 2016-02-20 NOTE — Addendum Note (Signed)
Addended by: Jaynie CollinsANYANWU, UGONNA A on: 02/20/2016 08:35 AM   Modules accepted: Orders

## 2016-02-20 NOTE — Telephone Encounter (Signed)
-----   Message from Ugonna A Anyanwu, MD sent at 02/19/2016  7:20 PM EDT ----- Patient will need IV iron, needs to get in MAU (Ferraheme) 

## 2016-02-21 ENCOUNTER — Telehealth: Payer: Self-pay | Admitting: *Deleted

## 2016-02-21 NOTE — Telephone Encounter (Signed)
-----   Message from Tereso NewcomerUgonna A Anyanwu, MD sent at 02/19/2016  7:20 PM EDT ----- Patient will need IV iron, needs to get in MAU Heidi Woods(Ferraheme)

## 2016-02-21 NOTE — Telephone Encounter (Signed)
Informed pt of CBC result and recommendation for IV Iron infusion.  Pt acknowledged, called [facility], spoke to receptionist who needed to verify when and how to schedule her appt.  Awaiting call back from office for appt for IV Iron infusion.

## 2016-02-22 ENCOUNTER — Telehealth: Payer: Self-pay | Admitting: *Deleted

## 2016-02-22 NOTE — Telephone Encounter (Signed)
Scheduled appointment for IV Iron infusion for 02-27-16 at 9:00 at [facility].  Called pt and informed her of appointment date and time.

## 2016-02-25 LAB — HGB ELECTROPHORESIS REFLEXED REPORT
HEMOGLOBIN A - HGBRFX: 0 % — AB (ref >96.0)
HEMOGLOBIN O-ARAB - HGBRFX: 51.8 % — AB
Hemoglobin A2 - HGBRFX: 3.4 % (ref 1.8–3.5)
Hemoglobin Elect C: 44.8 % — ABNORMAL HIGH
Hemoglobin F - HGBRFX: 0 % (ref <2.0)
Sickle Solubility Test - HGBRFX: NEGATIVE

## 2016-02-27 ENCOUNTER — Other Ambulatory Visit (HOSPITAL_COMMUNITY): Payer: Self-pay | Admitting: *Deleted

## 2016-02-27 ENCOUNTER — Encounter (HOSPITAL_COMMUNITY): Payer: Commercial Managed Care - HMO

## 2016-02-28 ENCOUNTER — Ambulatory Visit (HOSPITAL_COMMUNITY)
Admission: RE | Admit: 2016-02-28 | Discharge: 2016-02-28 | Disposition: A | Discharge status: Home / Self Care | Payer: Commercial Managed Care - HMO | Source: Ambulatory Visit | Attending: Obstetrics & Gynecology | Admitting: Obstetrics & Gynecology

## 2016-02-28 ENCOUNTER — Encounter (HOSPITAL_COMMUNITY): Payer: Self-pay

## 2016-02-28 ENCOUNTER — Ambulatory Visit (HOSPITAL_COMMUNITY): Payer: Commercial Managed Care - HMO

## 2016-02-28 VITALS — BP 112/66 | HR 97 | Wt 181.8 lb

## 2016-02-28 DIAGNOSIS — Z862 Personal history of diseases of the blood and blood-forming organs and certain disorders involving the immune mechanism: Secondary | ICD-10-CM | POA: Insufficient documentation

## 2016-02-28 DIAGNOSIS — O09529 Supervision of elderly multigravida, unspecified trimester: Secondary | ICD-10-CM

## 2016-02-28 DIAGNOSIS — O283 Abnormal ultrasonic finding on antenatal screening of mother: Secondary | ICD-10-CM | POA: Insufficient documentation

## 2016-02-28 DIAGNOSIS — Z3A18 18 weeks gestation of pregnancy: Secondary | ICD-10-CM | POA: Diagnosis not present

## 2016-02-28 DIAGNOSIS — O09522 Supervision of elderly multigravida, second trimester: Secondary | ICD-10-CM | POA: Diagnosis present

## 2016-03-03 ENCOUNTER — Telehealth: Payer: Self-pay | Admitting: *Deleted

## 2016-03-03 ENCOUNTER — Ambulatory Visit (HOSPITAL_COMMUNITY): Admission: RE | Admit: 2016-03-03 | Payer: Commercial Managed Care - HMO | Source: Ambulatory Visit

## 2016-03-03 NOTE — Telephone Encounter (Signed)
Pt called in stating her appt for IV Iron was canceled for today because a pre-cert was needed. I called Melanie at [employer] at [phone] and got ICD-10: O99.019 and CPT: Q0138. I called [employer] at [phone] and spoke to [employer] CorporationShay M. She advised no pre-cert required. Called Melanie back and let her know no pre-cert needed. Called [facility] at [phone] and spoke to LaVern who stated pt can call back to make another appt for tomorrow. Called pt back to adv to call LaVern at [phone]. Pt expressed understanding.

## 2016-03-04 ENCOUNTER — Other Ambulatory Visit (HOSPITAL_COMMUNITY): Payer: Self-pay | Admitting: *Deleted

## 2016-03-05 ENCOUNTER — Ambulatory Visit (HOSPITAL_COMMUNITY)
Admission: RE | Admit: 2016-03-05 | Discharge: 2016-03-05 | Disposition: A | Discharge status: Home / Self Care | Payer: Commercial Managed Care - HMO | Source: Ambulatory Visit | Attending: Obstetrics & Gynecology | Admitting: Obstetrics & Gynecology

## 2016-03-05 VITALS — BP 102/59 | HR 102 | Temp 98.0°F | Resp 20 | Ht 68.0 in | Wt 180.0 lb

## 2016-03-05 DIAGNOSIS — D649 Anemia, unspecified: Secondary | ICD-10-CM | POA: Insufficient documentation

## 2016-03-05 DIAGNOSIS — Z3A00 Weeks of gestation of pregnancy not specified: Secondary | ICD-10-CM | POA: Diagnosis not present

## 2016-03-05 DIAGNOSIS — O99019 Anemia complicating pregnancy, unspecified trimester: Secondary | ICD-10-CM | POA: Insufficient documentation

## 2016-03-05 LAB — FERRITIN: FERRITIN: 5 ng/mL — AB (ref 11–307)

## 2016-03-05 LAB — RETICULOCYTES
RBC.: 3.02 MIL/uL — AB (ref 3.87–5.11)
Retic Count, Absolute: 78.5 10*3/uL (ref 19.0–186.0)
Retic Ct Pct: 2.6 % (ref 0.4–3.1)

## 2016-03-05 LAB — SAVE SMEAR

## 2016-03-05 LAB — IRON AND TIBC
Iron: 42 ug/dL (ref 28–170)
SATURATION RATIOS: 9 % — AB (ref 10.4–31.8)
TIBC: 456 ug/dL — AB (ref 250–450)
UIBC: 414 ug/dL

## 2016-03-05 LAB — VITAMIN B12: VITAMIN B 12: 190 pg/mL (ref 180–914)

## 2016-03-05 MED ORDER — SODIUM CHLORIDE 0.9 % IV SOLN
510.0000 mg | INTRAVENOUS | Status: DC
Start: 2016-03-05 — End: 2016-03-06
  Administered 2016-03-05: 510 mg via INTRAVENOUS
  Filled 2016-03-05: qty 17

## 2016-03-05 NOTE — Discharge Instructions (Signed)
Excuse from Work, [employer], or Physical Activity Heidi Shavazz______ needs to be excused from: X_____ Work _____ [employer] _____ Physical activity beginning now and through the following date: _____6/21/2017_______________. _X____ He or she may return to work_____6/21/17____________. Activity restrictions include: ___NA__ Lifting more than _______ lb __NA___ Sitting longer than __________ minutes at a time _NA____ Standing longer than ________ minutes at a time _NA___ He or she may return to full physical activity as of ___________________. Health Care Provider Name (printed): ____Kristin Mammie Russianeis RN ____________________________________  [facility] (signature): ___________________________________________ Date: ________________   This information is not intended to replace advice given to you by your health care provider. Make sure you discuss any questions you have with your health care provider.   Document Released: 02/25/2001 Document Revised: 09/22/2014 Document Reviewed: 04/03/2014 Elsevier Interactive Patient Education [employer]2016 Elsevier Inc.

## 2016-03-06 ENCOUNTER — Telehealth (HOSPITAL_COMMUNITY): Payer: Self-pay | Admitting: MS"

## 2016-03-06 LAB — FOLATE RBC
FOLATE, RBC: 1911 ng/mL (ref >498)
Folate, Hemolysate: 359.2 ng/mL
Hematocrit: 18.8 % — ABNORMAL LOW (ref 34.0–46.6)

## 2016-03-06 NOTE — Telephone Encounter (Signed)
Attempted to call patient regarding results of prenatal cell free DNA testing, which are within normal range. Left message for patient to return call.   Clydie BraunKaren Keyontae Huckeby 03/06/2016 2:00 PM

## 2016-03-06 NOTE — Telephone Encounter (Signed)
Called Heidi Woods to discuss her prenatal cell free DNA test results.  [name] had Panorama testing through [facility].  Testing was offered because of abnormal ultrasound finding.   The patient was identified by name and DOB.  We reviewed that these are within normal limits, showing a less than 1 in 10,000 risk for trisomies 21, 18 and 13, and monosomy X (Turner syndrome).  In addition, the risk for triploidy and sex chromosome trisomies (47,XXX and 47,XXY) was also low risk.  [name] elected to have cffDNA analysis for 22q11 deletion syndrome, which was also low risk (1 in 13,300).  We reviewed that this testing identifies > 99% of pregnancies with trisomy 921, trisomy 3313, sex chromosome trisomies (47,XXX and 47,XXY), and triploidy. The detection rate for trisomy 18 is 96%.  The detection rate for monosomy X is ~92%.  The false positive rate is <0.1% for all conditions. Testing was also consistent with female fetal sex.  The patient did wish to know fetal sex.  She understands that this testing does not identify all genetic conditions.  All questions were answered to her satisfaction, she was encouraged to call with additional questions or concerns.  Quinn PlowmanKaren Achillies Buehl, MS Certified Genetic Counselor 03/06/2016 2:28 PM

## 2016-03-07 ENCOUNTER — Other Ambulatory Visit (HOSPITAL_COMMUNITY): Payer: Self-pay

## 2016-03-10 ENCOUNTER — Encounter (HOSPITAL_COMMUNITY): Payer: Self-pay | Admitting: *Deleted

## 2016-03-10 ENCOUNTER — Other Ambulatory Visit (HOSPITAL_COMMUNITY): Payer: Self-pay | Admitting: *Deleted

## 2016-03-10 ENCOUNTER — Inpatient Hospital Stay (HOSPITAL_COMMUNITY)
Admission: AD | Admit: 2016-03-10 | Discharge: 2016-03-10 | Disposition: A | Discharge status: Home / Self Care | Payer: Commercial Managed Care - HMO | Source: Ambulatory Visit | Attending: Obstetrics & Gynecology | Admitting: Obstetrics & Gynecology

## 2016-03-10 DIAGNOSIS — N949 Unspecified condition associated with female genital organs and menstrual cycle: Secondary | ICD-10-CM

## 2016-03-10 DIAGNOSIS — O9989 Other specified diseases and conditions complicating pregnancy, childbirth and the puerperium: Secondary | ICD-10-CM | POA: Diagnosis not present

## 2016-03-10 DIAGNOSIS — R109 Unspecified abdominal pain: Secondary | ICD-10-CM | POA: Insufficient documentation

## 2016-03-10 DIAGNOSIS — O26892 Other specified pregnancy related conditions, second trimester: Secondary | ICD-10-CM | POA: Diagnosis not present

## 2016-03-10 DIAGNOSIS — Z3A19 19 weeks gestation of pregnancy: Secondary | ICD-10-CM | POA: Diagnosis not present

## 2016-03-10 DIAGNOSIS — D573 Sickle-cell trait: Secondary | ICD-10-CM | POA: Insufficient documentation

## 2016-03-10 LAB — URINALYSIS, ROUTINE W REFLEX MICROSCOPIC
Bilirubin Urine: NEGATIVE
Glucose, UA: NEGATIVE mg/dL
HGB URINE DIPSTICK: NEGATIVE
Ketones, ur: NEGATIVE mg/dL
LEUKOCYTES UA: NEGATIVE
NITRITE: NEGATIVE
PROTEIN: NEGATIVE mg/dL
SPECIFIC GRAVITY, URINE: 1.025 (ref 1.005–1.030)
pH: 6 (ref 5.0–8.0)

## 2016-03-10 MED ORDER — ACETAMINOPHEN 500 MG PO TABS
1000.0000 mg | ORAL_TABLET | Freq: Once | ORAL | Status: AC
  Administered 2016-03-10: 1000 mg via ORAL
  Filled 2016-03-10: qty 2

## 2016-03-10 NOTE — MAU Note (Signed)
Pt reports throbbing pain in her lower abd and lower back since yesterday. Denies bleeding.

## 2016-03-10 NOTE — MAU Provider Note (Signed)
History     CSN: [id number]  Arrival date and time: 03/10/16 [id number]   First Provider Initiated Contact with Patient 03/10/16 0112      Chief Complaint  Patient presents with  . Abdominal Pain   HPI Ms. Heidi Woods is a 35 y.o. [id number] at 4633w4d who presents to MAU today with complaint of abdominal pain since yesterday. The patient states that she is also having low back pain since today. She states pain is mostly with ambulation and is located in the suprapubic region and LLQ. She denies vaginal bleeding, discharge, LOF, UTI symptoms, fever, N/V/D or constipation. She rates her pain at 5/10 at the worst. She has not taken anything for pain.   OB History    Gravida Para Term Preterm AB TAB SAB Ectopic Multiple Living   13 3 3  0 9 7 2  0 0 3      Past Medical History  Diagnosis Date  . Anemia   . Allergy     seasonal  . LGSIL (low grade squamous intraepithelial lesion) on Pap smear 04/05/2013  . Headache(784.0)   . Ovarian cyst   . Abnormal Pap smear of cervix 2004    colpo, ok since  . Infection     UTI  . Sickle cell trait [facility](HCC)     Past Surgical History  Procedure Laterality Date  . Cholecystectomy  2012  . Induced abortion      x 7  . Umbbilical hernia repair      Family History  Problem Relation Age of Onset  . Sickle cell anemia Father   . Cancer Father     prostate  . Cancer Maternal Grandfather     prostate  . Hypertension Maternal Grandfather   . Cancer Paternal Grandfather     throat, lung  . Hypertension Maternal Grandmother   . Cancer Maternal Grandmother     colon    Social History  Substance Use Topics  . Smoking status: Never Smoker   . Smokeless tobacco: Never Used  . Alcohol Use: No    Allergies: No Known Allergies  Prescriptions prior to admission  Medication Sig Dispense Refill Last Dose  . Prenatal Vit-Fe Fumarate-FA (PRENATAL MULTIVITAMIN) TABS tablet Take 1 tablet by mouth daily at 12 noon. Reported on 12/17/2015   03/09/2016 at  Unknown time  . ferrous sulfate (FERROUSUL) 325 (65 FE) MG tablet Take 1 tablet (325 mg total) by mouth 2 (two) times daily with a meal. 60 tablet 2 Taking    Review of Systems  Constitutional: Negative for fever and malaise/fatigue.  Gastrointestinal: Positive for abdominal pain. Negative for nausea, vomiting, diarrhea and constipation.  Genitourinary: Negative for dysuria, urgency and frequency.       Neg - vaginal bleeding, discharge, LOF   Physical Exam   Blood pressure 117/72, pulse 98, temperature 98.4 F (36.9 C), resp. rate 18, height 5' 8.5" (1.74 m), weight 183 lb (83.008 kg), last menstrual period 10/25/2015, SpO2 100 %.  Physical Exam  Nursing note and vitals reviewed. Constitutional: She is oriented to person, place, and time. She appears well-developed and well-nourished. No distress.  HENT:  Head: Normocephalic and atraumatic.  Cardiovascular: Normal rate.   Respiratory: Effort normal.  GI: Soft. She exhibits no distension and no mass. There is tenderness (mild tenderness to palpation of the LLQ). There is no rebound and no guarding.  Neurological: She is alert and oriented to person, place, and time.  Skin: Skin is warm and dry. No erythema.  Psychiatric: She has a normal mood and affect.  Dilation: Closed Effacement (%): Thick Cervical Position: Posterior Exam by:: Magnus SinningWenzel, PA   Results for orders placed or performed during the hospital encounter of 03/10/16 (from the past 24 hour(s))  Urinalysis, Routine w reflex microscopic ([facility])     Status: None   Collection Time: 03/10/16 12:50 AM  Result Value Ref Range   Color, Urine YELLOW YELLOW   APPearance CLEAR CLEAR   Specific Gravity, Urine 1.025 1.005 - 1.030   pH 6.0 5.0 - 8.0   Glucose, UA NEGATIVE NEGATIVE mg/dL   Hgb urine dipstick NEGATIVE NEGATIVE   Bilirubin Urine NEGATIVE NEGATIVE   Ketones, ur NEGATIVE NEGATIVE mg/dL   Protein, ur NEGATIVE NEGATIVE mg/dL   Nitrite NEGATIVE NEGATIVE    Leukocytes, UA NEGATIVE NEGATIVE    MAU Course  Procedures None  MDM FHR - 157 bpm with doppler UA today without evidence of infection or dehydration  Assessment and Plan  A: SIUP at 9064w4d Round ligament pain  P: Discharge home Tylenol PRN for pain advised Dicussed use of abdominal binder, moderation of activity and warm bath/shower for relief of pain Second trimester precautions discussed Patient advised to follow-up with [facility] as scheduled for routine prenatal care or sooner PRN Patient may return to MAU as needed or if her condition were to change or worsen  Marny LowensteinJulie N Argie Applegate, PA-C  03/10/2016, 2:01 AM

## 2016-03-10 NOTE — Discharge Instructions (Signed)

## 2016-03-11 ENCOUNTER — Ambulatory Visit (HOSPITAL_COMMUNITY): Payer: Commercial Managed Care - HMO

## 2016-03-14 ENCOUNTER — Other Ambulatory Visit (HOSPITAL_COMMUNITY): Payer: Self-pay | Admitting: *Deleted

## 2016-03-17 ENCOUNTER — Ambulatory Visit (INDEPENDENT_AMBULATORY_CARE_PROVIDER_SITE_OTHER): Payer: Commercial Managed Care - HMO | Admitting: Obstetrics and Gynecology

## 2016-03-17 ENCOUNTER — Ambulatory Visit (HOSPITAL_COMMUNITY): Payer: Commercial Managed Care - HMO | Attending: Obstetrics & Gynecology

## 2016-03-17 VITALS — BP 110/65 | HR 109 | Wt 184.0 lb

## 2016-03-17 DIAGNOSIS — O99019 Anemia complicating pregnancy, unspecified trimester: Secondary | ICD-10-CM

## 2016-03-17 DIAGNOSIS — Z3482 Encounter for supervision of other normal pregnancy, second trimester: Secondary | ICD-10-CM

## 2016-03-17 DIAGNOSIS — O09522 Supervision of elderly multigravida, second trimester: Secondary | ICD-10-CM

## 2016-03-17 DIAGNOSIS — Z3492 Encounter for supervision of normal pregnancy, unspecified, second trimester: Secondary | ICD-10-CM

## 2016-03-17 NOTE — Progress Notes (Signed)
Patient here for routine ob follow up. Patient is now receiving iron infusions

## 2016-03-17 NOTE — Progress Notes (Signed)
Subjective:  Heidi Woods is a 35 y.o. [id number] at 529w4d being seen today for ongoing prenatal care.  She is currently monitored for the following issues for this high-risk pregnancy and has Supervision of normal pregnancy; Sickle cell trait (HCC); Advanced maternal age in multigravida; Anemia affecting pregnancy; Increased nuchal translucency space on fetal ultrasound; and Dysplasia of cervix, low grade (CIN 1) on her problem list.  Patient reports no complaints.  Contractions: Irregular. Vag. Bleeding: None.  Movement: Present. Denies leaking of fluid.   The following portions of the patient's history were reviewed and updated as appropriate: allergies, current medications, past family history, past medical history, past social history, past surgical history and problem list. Problem list updated.  Objective:   Filed Vitals:   03/17/16 1607  BP: 110/65  Pulse: 109  Weight: 184 lb (83.462 kg)    Fetal Status: Fetal Heart Rate (bpm): 160 Fundal Height: 20 cm Movement: Present     General:  Alert, oriented and cooperative. Patient is in no acute distress.  Skin: Skin is warm and dry. No rash noted.   Cardiovascular: Normal heart rate noted  Respiratory: Normal respiratory effort, no problems with respiration noted  Abdomen: Soft, gravid, appropriate for gestational age. Pain/Pressure: Absent     Pelvic: Cervical exam deferred        Extremities: Normal range of motion.  Edema: None  Mental Status: Normal mood and affect. Normal behavior. Normal judgment and thought content.   Urinalysis:      Assessment and Plan:  Pregnancy: [id number] at 2729w4d  1. Supervision of normal pregnancy, second trimester Patient is doing well  2. Advanced maternal age in multigravida, second trimester Normal NIPS Fetus with AV canal defect on anatomy- normal fetal echo on 03/11/16 Follow up ultrasound on 03/27/2016  3. Anemia affecting pregnancy Continue iron supplements  General obstetric precautions  including but not limited to vaginal bleeding, contractions, leaking of fluid and fetal movement were reviewed in detail with the patient. Please refer to After Visit Summary for other counseling recommendations.  Return in about 4 weeks (around 04/14/2016).   Catalina AntiguaPeggy Elmo Shumard, MD

## 2016-03-27 ENCOUNTER — Encounter (HOSPITAL_COMMUNITY): Payer: Self-pay

## 2016-03-27 ENCOUNTER — Ambulatory Visit (HOSPITAL_COMMUNITY)
Admission: RE | Admit: 2016-03-27 | Discharge: 2016-03-27 | Disposition: A | Discharge status: Home / Self Care | Payer: Commercial Managed Care - HMO | Source: Ambulatory Visit | Attending: Obstetrics & Gynecology | Admitting: Obstetrics & Gynecology

## 2016-03-27 VITALS — BP 118/61 | HR 87 | Wt 188.1 lb

## 2016-03-27 DIAGNOSIS — O283 Abnormal ultrasonic finding on antenatal screening of mother: Secondary | ICD-10-CM | POA: Diagnosis not present

## 2016-03-27 DIAGNOSIS — O09522 Supervision of elderly multigravida, second trimester: Secondary | ICD-10-CM | POA: Insufficient documentation

## 2016-03-27 DIAGNOSIS — Z862 Personal history of diseases of the blood and blood-forming organs and certain disorders involving the immune mechanism: Secondary | ICD-10-CM | POA: Insufficient documentation

## 2016-03-27 DIAGNOSIS — O359XX0 Maternal care for (suspected) fetal abnormality and damage, unspecified, not applicable or unspecified: Secondary | ICD-10-CM | POA: Diagnosis not present

## 2016-03-27 DIAGNOSIS — O09529 Supervision of elderly multigravida, unspecified trimester: Secondary | ICD-10-CM

## 2016-03-27 DIAGNOSIS — O99019 Anemia complicating pregnancy, unspecified trimester: Secondary | ICD-10-CM

## 2016-03-27 DIAGNOSIS — Z3A22 22 weeks gestation of pregnancy: Secondary | ICD-10-CM | POA: Insufficient documentation

## 2016-03-27 DIAGNOSIS — O289 Unspecified abnormal findings on antenatal screening of mother: Secondary | ICD-10-CM | POA: Diagnosis not present

## 2016-03-27 DIAGNOSIS — Z3492 Encounter for supervision of normal pregnancy, unspecified, second trimester: Secondary | ICD-10-CM

## 2016-04-09 ENCOUNTER — Telehealth: Payer: Self-pay | Admitting: *Deleted

## 2016-04-09 DIAGNOSIS — R110 Nausea: Secondary | ICD-10-CM

## 2016-04-09 MED ORDER — PROMETHAZINE HCL 25 MG PO TABS: 25.0000 mg | ORAL_TABLET | Freq: Four times a day (QID) | ORAL | 0 refills | Status: DC | PRN

## 2016-04-09 NOTE — Telephone Encounter (Signed)
Spoke to Heidi Woods, has been experiencing tooth pain on and off over the past few days and is using Tylenol which helps with some of the discomfort but the pain continues.  Also c/o dizziness and nausea as well.  Sent Phenergan to pharmacy per Dr Marice Potter order and instructed on medication use.  Also instructed Heidi Woods to increase fluid intake and to call her dentist to evaluate the tooth pain.  Heidi Woods acknowledged instructions.

## 2016-04-09 NOTE — Telephone Encounter (Signed)
-----   Message from Olevia Bowens sent at 04/09/2016 10:11 AM EDT ----- Regarding: Advise Contact: [phone] Having dizziness and nausea and a tooth ache, has taking tylenol twice for the tooth ache but doesn't want to keep taking it. Wants to know what else she can do/take

## 2016-04-10 ENCOUNTER — Telehealth: Payer: Self-pay | Admitting: *Deleted

## 2016-04-10 NOTE — Telephone Encounter (Signed)
Pt called in stating she has an abscessed tooth that her dentist has treated her for yesterday with Amoxicillin and Vicodin for pain. She states her tooth pain is so extreme that it is interfering with her sleep and she is unable to eat.  Explained to pt that dentist would need to prescribe anything further as OB is unable to prescribe anything stronger than what she already has. Explained she would need to go to MAU if pain was too unbearable. Also, advised that abx should reduce some inflammation in her tooth which would reduce pain but may take a couple of days. Pt expressed understanding.

## 2016-04-11 ENCOUNTER — Encounter: Payer: Self-pay | Admitting: *Deleted

## 2016-04-17 ENCOUNTER — Ambulatory Visit (INDEPENDENT_AMBULATORY_CARE_PROVIDER_SITE_OTHER): Payer: Commercial Managed Care - HMO | Admitting: Advanced Practice Midwife

## 2016-04-17 VITALS — BP 120/71 | HR 108 | Wt 193.0 lb

## 2016-04-17 DIAGNOSIS — Z3482 Encounter for supervision of other normal pregnancy, second trimester: Secondary | ICD-10-CM

## 2016-04-17 DIAGNOSIS — Z3492 Encounter for supervision of normal pregnancy, unspecified, second trimester: Secondary | ICD-10-CM

## 2016-04-17 NOTE — Progress Notes (Signed)
Subjective:  Heidi Woods is a 35 y.o. [id number] at [redacted]w[redacted]d being seen today for ongoing prenatal care.  She is currently monitored for the following issues for this low-risk pregnancy and has Supervision of normal pregnancy; Sickle cell trait (HCC); Advanced maternal age in multigravida; Anemia affecting pregnancy; Increased nuchal translucency space on fetal ultrasound; and Dysplasia of cervix, low grade (CIN 1) on her problem list.  Patient reports no complaints.  Contractions: Irregular. Vag. Bleeding: None.  Movement: Present. Denies leaking of fluid.   The following portions of the patient's history were reviewed and updated as appropriate: allergies, current medications, past family history, past medical history, past social history, past surgical history and problem list. Problem list updated.  Objective:   Vitals:   04/17/16 1602  BP: 120/71  Pulse: (!) 108  Weight: 193 lb (87.5 kg)    Fetal Status: Fetal Heart Rate (bpm): 147   Movement: Present     General:  Alert, oriented and cooperative. Patient is in no acute distress.  Skin: Skin is warm and dry. No rash noted.   Cardiovascular: Normal heart rate noted  Respiratory: Normal respiratory effort, no problems with respiration noted  Abdomen: Soft, gravid, appropriate for gestational age. Pain/Pressure: Absent     Pelvic:  Cervical exam deferred        Extremities: Normal range of motion.  Edema: Trace  Mental Status: Normal mood and affect. Normal behavior. Normal judgment and thought content.   Urinalysis:      Assessment and Plan:  Pregnancy: [id number] at [redacted]w[redacted]d  1. Supervision of normal pregnancy, second trimester   Preterm labor symptoms and general obstetric precautions including but not limited to vaginal bleeding, contractions, leaking of fluid and fetal movement were reviewed in detail with the patient. Please refer to After Visit Summary for other counseling recommendations.  Return in about 3 weeks (around  05/08/2016).   Hurshel Party, CNM

## 2016-05-08 ENCOUNTER — Ambulatory Visit (HOSPITAL_COMMUNITY)
Admission: RE | Admit: 2016-05-08 | Discharge: 2016-05-08 | Disposition: A | Discharge status: Home / Self Care | Payer: Commercial Managed Care - HMO | Source: Ambulatory Visit | Attending: Obstetrics & Gynecology | Admitting: Obstetrics & Gynecology

## 2016-05-08 ENCOUNTER — Encounter (HOSPITAL_COMMUNITY): Payer: Self-pay

## 2016-05-08 DIAGNOSIS — O09529 Supervision of elderly multigravida, unspecified trimester: Secondary | ICD-10-CM

## 2016-05-08 DIAGNOSIS — O09522 Supervision of elderly multigravida, second trimester: Secondary | ICD-10-CM | POA: Diagnosis not present

## 2016-05-08 DIAGNOSIS — O358XX0 Maternal care for other (suspected) fetal abnormality and damage, not applicable or unspecified: Secondary | ICD-10-CM | POA: Diagnosis not present

## 2016-05-08 DIAGNOSIS — O99019 Anemia complicating pregnancy, unspecified trimester: Secondary | ICD-10-CM

## 2016-05-08 DIAGNOSIS — Z3A28 28 weeks gestation of pregnancy: Secondary | ICD-10-CM | POA: Insufficient documentation

## 2016-05-08 DIAGNOSIS — Z3492 Encounter for supervision of normal pregnancy, unspecified, second trimester: Secondary | ICD-10-CM

## 2016-05-08 DIAGNOSIS — O283 Abnormal ultrasonic finding on antenatal screening of mother: Secondary | ICD-10-CM | POA: Diagnosis present

## 2016-05-09 ENCOUNTER — Ambulatory Visit (INDEPENDENT_AMBULATORY_CARE_PROVIDER_SITE_OTHER): Payer: Commercial Managed Care - HMO | Admitting: Family Medicine

## 2016-05-09 ENCOUNTER — Encounter: Payer: Self-pay | Admitting: *Deleted

## 2016-05-09 VITALS — BP 114/71 | HR 101 | Wt 192.0 lb

## 2016-05-09 DIAGNOSIS — O09523 Supervision of elderly multigravida, third trimester: Secondary | ICD-10-CM | POA: Diagnosis not present

## 2016-05-09 DIAGNOSIS — Z3492 Encounter for supervision of normal pregnancy, unspecified, second trimester: Secondary | ICD-10-CM

## 2016-05-09 DIAGNOSIS — Z23 Encounter for immunization: Secondary | ICD-10-CM | POA: Diagnosis not present

## 2016-05-09 DIAGNOSIS — L298 Other pruritus: Secondary | ICD-10-CM

## 2016-05-09 DIAGNOSIS — N898 Other specified noninflammatory disorders of vagina: Secondary | ICD-10-CM

## 2016-05-09 LAB — CBC
HEMATOCRIT: 22.8 % — AB (ref 35.0–45.0)
HEMOGLOBIN: 7.6 g/dL — AB (ref 11.7–15.5)
MCH: 23.2 pg — AB (ref 27.0–33.0)
MCHC: 33.3 g/dL (ref 32.0–36.0)
MCV: 69.7 fL — AB (ref 80.0–100.0)
Platelets: 162 10*3/uL (ref 140–400)
RBC: 3.27 MIL/uL — AB (ref 3.80–5.10)
RDW: 23.1 % — ABNORMAL HIGH (ref 11.0–15.0)
WBC: 9.8 10*3/uL (ref 3.8–10.8)

## 2016-05-09 MED ORDER — TERCONAZOLE 0.8 % VA CREA
1.0000 | TOPICAL_CREAM | Freq: Every day | VAGINAL | 0 refills | Status: DC
Start: 2016-05-09 — End: 2016-05-26

## 2016-05-09 NOTE — Progress Notes (Signed)
Subjective:  Storm Frisklexis Pauwels is a 35 y.o. [id number] at 8942w1d being seen today for ongoing prenatal care.  She is currently monitored for the following issues for this low-risk pregnancy and has Supervision of normal pregnancy; Sickle cell trait (HCC); Advanced maternal age in multigravida; Anemia affecting pregnancy; Increased nuchal translucency space on fetal ultrasound; and Dysplasia of cervix, low grade (CIN 1) on her problem list.  Patient reports no complaints.  Contractions: Irregular. Vag. Bleeding: None.  Movement: Present. Denies leaking of fluid.   The following portions of the patient's history were reviewed and updated as appropriate: allergies, current medications, past family history, past medical history, past social history, past surgical history and problem list. Problem list updated.  Objective:   Vitals:   05/09/16 0923  BP: 114/71  Pulse: (!) 101  Weight: 192 lb (87.1 kg)    Fetal Status: Fetal Heart Rate (bpm): 153 Fundal Height: 28 cm Movement: Present     General:  Alert, oriented and cooperative. Patient is in no acute distress.  Skin: Skin is warm and dry. No rash noted.   Cardiovascular: Normal heart rate noted  Respiratory: Normal respiratory effort, no problems with respiration noted  Abdomen: Soft, gravid, appropriate for gestational age. Pain/Pressure: Present     Pelvic:  Cervical exam deferred        Extremities: Normal range of motion.  Edema: Trace  Mental Status: Normal mood and affect. Normal behavior. Normal judgment and thought content.   Urinalysis: Urine Protein: Negative Urine Glucose: Negative  Assessment and Plan:  Pregnancy: [id number] at 5042w1d  1. Supervision of normal pregnancy, second trimester 28 wk labs today - Tdap vaccine greater than or equal to 7yo IM - CBC - HIV antibody - Glucose Tolerance, 1 HR (50g) - RPR  2. Advanced maternal age in multigravida, third trimester Normal NIPS  3. Vagina itching Trial of medication for  yeast--if no improvement, consider treatment for BV. - terconazole (TERAZOL 3) 0.8 % vaginal cream; Place 1 applicator vaginally at bedtime.  Dispense: 20 g; Refill: 0  Preterm labor symptoms and general obstetric precautions including but not limited to vaginal bleeding, contractions, leaking of fluid and fetal movement were reviewed in detail with the patient. Please refer to After Visit Summary for other counseling recommendations.  Return in 2 weeks (on 05/23/2016).   Reva Boresanya S Pratt, MD

## 2016-05-09 NOTE — Patient Instructions (Signed)
Contraception Choices Contraception (birth control) is the use of any methods or devices to prevent pregnancy. Below are some methods to help avoid pregnancy. HORMONAL METHODS   Contraceptive implant. This is a thin, plastic tube containing progesterone hormone. It does not contain estrogen hormone. Your health care provider inserts the tube in the inner part of the upper arm. The tube can remain in place for up to 3 years. After 3 years, the implant must be removed. The implant prevents the ovaries from releasing an egg (ovulation), thickens the cervical mucus to prevent sperm from entering the uterus, and thins the lining of the inside of the uterus.  Progesterone-only injections. These injections are given every 3 months by your health care provider to prevent pregnancy. This synthetic progesterone hormone stops the ovaries from releasing eggs. It also thickens cervical mucus and changes the uterine lining. This makes it harder for sperm to survive in the uterus.  Birth control pills. These pills contain estrogen and progesterone hormone. They work by preventing the ovaries from releasing eggs (ovulation). They also cause the cervical mucus to thicken, preventing the sperm from entering the uterus. Birth control pills are prescribed by a health care provider.Birth control pills can also be used to treat heavy periods.  Minipill. This type of birth control pill contains only the progesterone hormone. They are taken every day of each month and must be prescribed by your health care provider.  Birth control patch. The patch contains hormones similar to those in birth control pills. It must be changed once a week and is prescribed by a health care provider.  Vaginal ring. The ring contains hormones similar to those in birth control pills. It is left in the vagina for 3 weeks, removed for 1 week, and then a new one is put back in place. The patient must be comfortable inserting and removing the ring  from the vagina.A health care provider's prescription is necessary.  Emergency contraception. Emergency contraceptives prevent pregnancy after unprotected sexual intercourse. This pill can be taken right after sex or up to 5 days after unprotected sex. It is most effective the sooner you take the pills after having sexual intercourse. Most emergency contraceptive pills are available without a prescription. Check with your pharmacist. Do not use emergency contraception as your only form of birth control. BARRIER METHODS   Female condom. This is a thin sheath (latex or rubber) that is worn over the penis during sexual intercourse. It can be used with spermicide to increase effectiveness.  Female condom. This is a soft, loose-fitting sheath that is put into the vagina before sexual intercourse.  Diaphragm. This is a soft, latex, dome-shaped barrier that must be fitted by a health care provider. It is inserted into the vagina, along with a spermicidal jelly. It is inserted before intercourse. The diaphragm should be left in the vagina for 6 to 8 hours after intercourse.  Cervical cap. This is a round, soft, latex or plastic cup that fits over the cervix and must be fitted by a health care provider. The cap can be left in place for up to 48 hours after intercourse.  Sponge. This is a soft, circular piece of polyurethane foam. The sponge has spermicide in it. It is inserted into the vagina after wetting it and before sexual intercourse.  Spermicides. These are chemicals that kill or block sperm from entering the cervix and uterus. They come in the form of creams, jellies, suppositories, foam, or tablets. They do not require a   prescription. They are inserted into the vagina with an applicator before having sexual intercourse. The process must be repeated every time you have sexual intercourse. INTRAUTERINE CONTRACEPTION  Intrauterine device (IUD). This is a T-shaped device that is put in a woman's uterus  during a menstrual period to prevent pregnancy. There are 2 types:  Copper IUD. This type of IUD is wrapped in copper wire and is placed inside the uterus. Copper makes the uterus and fallopian tubes produce a fluid that kills sperm. It can stay in place for 10 years.  Hormone IUD. This type of IUD contains the hormone progestin (synthetic progesterone). The hormone thickens the cervical mucus and prevents sperm from entering the uterus, and it also thins the uterine lining to prevent implantation of a fertilized egg. The hormone can weaken or kill the sperm that get into the uterus. It can stay in place for 3-5 years, depending on which type of IUD is used. PERMANENT METHODS OF CONTRACEPTION  Female tubal ligation. This is when the woman's fallopian tubes are surgically sealed, tied, or blocked to prevent the egg from traveling to the uterus.  Hysteroscopic sterilization. This involves placing a small coil or insert into each fallopian tube. Your doctor uses a technique called hysteroscopy to do the procedure. The device causes scar tissue to form. This results in permanent blockage of the fallopian tubes, so the sperm cannot fertilize the egg. It takes about 3 months after the procedure for the tubes to become blocked. You must use another form of birth control for these 3 months.  Female sterilization. This is when the female has the tubes that carry sperm tied off (vasectomy).This blocks sperm from entering the vagina during sexual intercourse. After the procedure, the man can still ejaculate fluid (semen). NATURAL PLANNING METHODS  Natural family planning. This is not having sexual intercourse or using a barrier method (condom, diaphragm, cervical cap) on days the woman could become pregnant.  Calendar method. This is keeping track of the length of each menstrual cycle and identifying when you are fertile.  Ovulation method. This is avoiding sexual intercourse during ovulation.  Symptothermal  method. This is avoiding sexual intercourse during ovulation, using a thermometer and ovulation symptoms.  Post-ovulation method. This is timing sexual intercourse after you have ovulated. Regardless of which type or method of contraception you choose, it is important that you use condoms to protect against the transmission of sexually transmitted infections (STIs). Talk with your health care provider about which form of contraception is most appropriate for you.   This information is not intended to replace advice given to you by your health care provider. Make sure you discuss any questions you have with your health care provider.   Document Released: 09/01/2005 Document Revised: 09/06/2013 Document Reviewed: 02/24/2013 Elsevier Interactive Patient Education 2016 [employer]  Breastfeeding Deciding to breastfeed is one of the best choices you can make for you and your baby. A change in hormones during pregnancy causes your breast tissue to grow and increases the number and size of your milk ducts. These hormones also allow proteins, sugars, and fats from your blood supply to make breast milk in your milk-producing glands. Hormones prevent breast milk from being released before your baby is born as well as prompt milk flow after birth. Once breastfeeding has begun, thoughts of your baby, as well as his or her sucking or crying, can stimulate the release of milk from your milk-producing glands.  BENEFITS OF BREASTFEEDING For Your Baby    Your first milk (colostrum) helps your baby's digestive system function better.  There are antibodies in your milk that help your baby fight off infections.  Your baby has a lower incidence of asthma, allergies, and sudden infant death syndrome.  The nutrients in breast milk are better for your baby than infant formulas and are designed uniquely for your baby's needs.  Breast milk improves your baby's brain development.  Your baby is less likely to develop  other conditions, such as childhood obesity, asthma, or type 2 diabetes mellitus. For You  Breastfeeding helps to create a very special Dinh between you and your baby.  Breastfeeding is convenient. Breast milk is always available at the correct temperature and costs nothing.  Breastfeeding helps to burn calories and helps you lose the weight gained during pregnancy.  Breastfeeding makes your uterus contract to its prepregnancy size faster and slows bleeding (lochia) after you give birth.   Breastfeeding helps to lower your risk of developing type 2 diabetes mellitus, osteoporosis, and breast or ovarian cancer later in life. SIGNS THAT YOUR BABY IS HUNGRY Early Signs of Hunger  Increased alertness or activity.  Stretching.  Movement of the head from side to side.  Movement of the head and opening of the mouth when the corner of the mouth or cheek is stroked (rooting).  Increased sucking sounds, smacking lips, cooing, sighing, or squeaking.  Hand-to-mouth movements.  Increased sucking of fingers or hands. Late Signs of Hunger  Fussing.  Intermittent crying. Extreme Signs of Hunger Signs of extreme hunger will require calming and consoling before your baby will be able to breastfeed successfully. Do not wait for the following signs of extreme hunger to occur before you initiate breastfeeding:  Restlessness.  A loud, strong cry.  Screaming. BREASTFEEDING BASICS Breastfeeding Initiation  Find a comfortable place to sit or lie down, with your neck and back well supported.  Place a pillow or rolled up blanket under your baby to bring him or her to the level of your breast (if you are seated). Nursing pillows are specially designed to help support your arms and your baby while you breastfeed.  Make sure that your baby's abdomen is facing your abdomen.  Gently massage your breast. With your fingertips, massage from your chest wall toward your nipple in a circular motion.  This encourages milk flow. You may need to continue this action during the feeding if your milk flows slowly.  Support your breast with 4 fingers underneath and your thumb above your nipple. Make sure your fingers are well away from your nipple and your baby's mouth.  Stroke your baby's lips gently with your finger or nipple.  When your baby's mouth is open wide enough, quickly bring your baby to your breast, placing your entire nipple and as much of the colored area around your nipple (areola) as possible into your baby's mouth.  More areola should be visible above your baby's upper lip than below the lower lip.  Your baby's tongue should be between his or her lower gum and your breast.  Ensure that your baby's mouth is correctly positioned around your nipple (latched). Your baby's lips should create a seal on your breast and be turned out (everted).  It is common for your baby to suck about 2-3 minutes in order to start the flow of breast milk. Latching Teaching your baby how to latch on to your breast properly is very important. An improper latch can cause nipple pain and decreased milk supply for   you and poor weight gain in your baby. Also, if your baby is not latched onto your nipple properly, he or she may swallow some air during feeding. This can make your baby fussy. Burping your baby when you switch breasts during the feeding can help to get rid of the air. However, teaching your baby to latch on properly is still the best way to prevent fussiness from swallowing air while breastfeeding. Signs that your baby has successfully latched on to your nipple:  Silent tugging or silent sucking, without causing you pain.  Swallowing heard between every 3-4 sucks.  Muscle movement above and in front of his or her ears while sucking. Signs that your baby has not successfully latched on to nipple:  Sucking sounds or smacking sounds from your baby while breastfeeding.  Nipple pain. If you  think your baby has not latched on correctly, slip your finger into the corner of your baby's mouth to break the suction and place it between your baby's gums. Attempt breastfeeding initiation again. Signs of Successful Breastfeeding Signs from your baby:  A gradual decrease in the number of sucks or complete cessation of sucking.  Falling asleep.  Relaxation of his or her body.  Retention of a small amount of milk in his or her mouth.  Letting go of your breast by himself or herself. Signs from you:  Breasts that have increased in firmness, weight, and size 1-3 hours after feeding.  Breasts that are softer immediately after breastfeeding.  Increased milk volume, as well as a change in milk consistency and color by the fifth day of breastfeeding.  Nipples that are not sore, cracked, or bleeding. Signs That Your Baby is Getting Enough Milk  Wetting at least 3 diapers in a 24-hour period. The urine should be clear and pale yellow by age 5 days.  At least 3 stools in a 24-hour period by age 5 days. The stool should be soft and yellow.  At least 3 stools in a 24-hour period by age 7 days. The stool should be seedy and yellow.  No loss of weight greater than 10% of birth weight during the first 3 days of age.  Average weight gain of 4-7 ounces (113-198 g) per week after age 4 days.  Consistent daily weight gain by age 5 days, without weight loss after the age of 2 weeks. After a feeding, your baby may spit up a small amount. This is common. BREASTFEEDING FREQUENCY AND DURATION Frequent feeding will help you make more milk and can prevent sore nipples and breast engorgement. Breastfeed when you feel the need to reduce the fullness of your breasts or when your baby shows signs of hunger. This is called "breastfeeding on demand." Avoid introducing a pacifier to your baby while you are working to establish breastfeeding (the first 4-6 weeks after your baby is born). After this time you  may choose to use a pacifier. Research has shown that pacifier use during the first year of a baby's life decreases the risk of sudden infant death syndrome (SIDS). Allow your baby to feed on each breast as long as he or she wants. Breastfeed until your baby is finished feeding. When your baby unlatches or falls asleep while feeding from the first breast, offer the second breast. Because newborns are often sleepy in the first few weeks of life, you may need to awaken your baby to get him or her to feed. Breastfeeding times will vary from baby to baby. However, the following   rules can serve as a guide to help you ensure that your baby is properly fed:  Newborns (babies 4 weeks of age or younger) may breastfeed every 1-3 hours.  Newborns should not go longer than 3 hours during the day or 5 hours during the night without breastfeeding.  You should breastfeed your baby a minimum of 8 times in a 24-hour period until you begin to introduce solid foods to your baby at around 6 months of age. BREAST MILK PUMPING Pumping and storing breast milk allows you to ensure that your baby is exclusively fed your breast milk, even at times when you are unable to breastfeed. This is especially important if you are going back to work while you are still breastfeeding or when you are not able to be present during feedings. Your lactation consultant can give you guidelines on how long it is safe to store breast milk. A breast pump is a machine that allows you to pump milk from your breast into a sterile bottle. The pumped breast milk can then be stored in a refrigerator or freezer. Some breast pumps are operated by hand, while others use electricity. Ask your lactation consultant which type will work best for you. Breast pumps can be purchased, but some hospitals and breastfeeding support groups lease breast pumps on a monthly basis. A lactation consultant can teach you how to hand express breast milk, if you prefer not to use  a pump. CARING FOR YOUR BREASTS WHILE YOU BREASTFEED Nipples can become dry, cracked, and sore while breastfeeding. The following recommendations can help keep your breasts moisturized and healthy:  Avoid using soap on your nipples.  Wear a supportive bra. Although not required, special nursing bras and tank tops are designed to allow access to your breasts for breastfeeding without taking off your entire bra or top. Avoid wearing underwire-style bras or extremely tight bras.  Air dry your nipples for 3-4minutes after each feeding.  Use only cotton bra pads to absorb leaked breast milk. Leaking of breast milk between feedings is normal.  Use lanolin on your nipples after breastfeeding. Lanolin helps to maintain your skin's normal moisture barrier. If you use pure lanolin, you do not need to wash it off before feeding your baby again. Pure lanolin is not toxic to your baby. You may also hand express a few drops of breast milk and gently massage that milk into your nipples and allow the milk to air dry. In the first few weeks after giving birth, some women experience extremely full breasts (engorgement). Engorgement can make your breasts feel heavy, warm, and tender to the touch. Engorgement peaks within 3-5 days after you give birth. The following recommendations can help ease engorgement:  Completely empty your breasts while breastfeeding or pumping. You may want to start by applying warm, moist heat (in the shower or with warm water-soaked hand towels) just before feeding or pumping. This increases circulation and helps the milk flow. If your baby does not completely empty your breasts while breastfeeding, pump any extra milk after he or she is finished.  Wear a snug bra (nursing or regular) or tank top for 1-2 days to signal your body to slightly decrease milk production.  Apply ice packs to your breasts, unless this is too uncomfortable for you.  Make sure that your baby is latched on and  positioned properly while breastfeeding. If engorgement persists after 48 hours of following these recommendations, contact your health care provider or a lactation consultant. OVERALL HEALTH   CARE RECOMMENDATIONS WHILE BREASTFEEDING  Eat healthy foods. Alternate between meals and snacks, eating 3 of each per day. Because what you eat affects your breast milk, some of the foods may make your baby more irritable than usual. Avoid eating these foods if you are sure that they are negatively affecting your baby.  Drink milk, fruit juice, and water to satisfy your thirst (about 10 glasses a day).  Rest often, relax, and continue to take your prenatal vitamins to prevent fatigue, stress, and anemia.  Continue breast self-awareness checks.  Avoid chewing and smoking tobacco. Chemicals from cigarettes that pass into breast milk and exposure to secondhand smoke may harm your baby.  Avoid alcohol and drug use, including marijuana. Some medicines that may be harmful to your baby can pass through breast milk. It is important to ask your health care provider before taking any medicine, including all over-the-counter and prescription medicine as well as vitamin and herbal supplements. It is possible to become pregnant while breastfeeding. If birth control is desired, ask your health care provider about options that will be safe for your baby. SEEK MEDICAL CARE IF:  You feel like you want to stop breastfeeding or have become frustrated with breastfeeding.  You have painful breasts or nipples.  Your nipples are cracked or bleeding.  Your breasts are red, tender, or warm.  You have a swollen area on either breast.  You have a fever or chills.  You have nausea or vomiting.  You have drainage other than breast milk from your nipples.  Your breasts do not become full before feedings by the fifth day after you give birth.  You feel sad and depressed.  Your baby is too sleepy to eat well.  Your  baby is having trouble sleeping.   Your baby is wetting less than 3 diapers in a 24-hour period.  Your baby has less than 3 stools in a 24-hour period.  Your baby's skin or the white part of his or her eyes becomes yellow.   Your baby is not gaining weight by 5 days of age. SEEK IMMEDIATE MEDICAL CARE IF:  Your baby is overly tired (lethargic) and does not want to wake up and feed.  Your baby develops an unexplained fever.   This information is not intended to replace advice given to you by your health care provider. Make sure you discuss any questions you have with your health care provider.   Document Released: 09/01/2005 Document Revised: 05/23/2015 Document Reviewed: 02/23/2013 Elsevier Interactive Patient Education 2016 [employer]  

## 2016-05-10 ENCOUNTER — Observation Stay
Admission: EM | Admit: 2016-05-10 | Discharge: 2016-05-10 | Disposition: A | Discharge status: Home / Self Care | Payer: Commercial Managed Care - HMO | Attending: Certified Nurse Midwife | Admitting: Certified Nurse Midwife

## 2016-05-10 DIAGNOSIS — Z3A28 28 weeks gestation of pregnancy: Secondary | ICD-10-CM | POA: Insufficient documentation

## 2016-05-10 LAB — GLUCOSE TOLERANCE, 1 HOUR (50G) W/O FASTING: Glucose, 1 Hr, gestational: 128 mg/dL (ref <140)

## 2016-05-10 LAB — URINALYSIS COMPLETE WITH MICROSCOPIC (ARMC ONLY)
BILIRUBIN URINE: NEGATIVE
GLUCOSE, UA: NEGATIVE mg/dL
Hgb urine dipstick: NEGATIVE
KETONES UR: NEGATIVE mg/dL
Leukocytes, UA: NEGATIVE
Nitrite: NEGATIVE
PROTEIN: 30 mg/dL — AB
Specific Gravity, Urine: 1.016 (ref 1.005–1.030)
pH: 7 (ref 5.0–8.0)

## 2016-05-10 LAB — RPR

## 2016-05-10 LAB — FETAL FIBRONECTIN: FETAL FIBRONECTIN: NEGATIVE

## 2016-05-10 NOTE — Final Progress Note (Addendum)
Physician Final Progress Note  Patient ID: Heidi Woods MRN: [id number] DOB/AGE: April 17, 1981 35 y.o.  Admit date: 05/10/2016 Admitting provider: Nadara Mustardobert P Harris, MD Discharge date: 05/10/2016   Admission Diagnoses: IUP at 28 wks 2days with pelvic pressure/ urge to push  Discharge Diagnoses:  IUP at 28 weeks 2d -not in labor  Consults: None  Significant Findings/ Diagnostic Studies: 35 year old G13 [id number] with EDC=07/31/2016 presented at 7328 week 2d with sudden pelvic/rectal pressure and urge to push about 1/2 hour PTA at L&D. Had been having contractions all day, intermittently, not timed. No vaginal bleeding or leakage of fluid. No nausea/ vomiting/ diarrhea. No fever. Baby very active. [facility]/ labs/ ultrasound. Pregnancy complicated by AMA, anemia (requiring iron transfusion), sickle cell trait, increased nuchal translucency on ultrasound with normal NIPS, and cervical dysplasia.  Immediately upon arrival with above complaints, a SVE revealed the cervix to be closed/50%/ -1 to -2 and extremely posterior. Baby cephalic on ultrasound.  General: appeared anxious and uncomfortable on arrival, relaxed after finding her cervix was closed. FHR: 150s -160s initially with much fetal activity, then into the 170s, with moderate variability. With po hydration and when baby's activity lessened, FHR returned to 150s prior to discharge.  Toco: some uterine irritability BP (!) 111/59 (BP Location: Right Arm)   Pulse (!) 101   Temp 98.6 F (37 C) (Oral)   Resp 18   Ht 5\' 8"  (1.727 m)   Wt 87.1 kg (192 lb)   LMP 10/25/2015 (Exact Date)   BMI 29.19 kg/m   Results for orders placed or performed during the hospital encounter of 05/10/16 (from the past 24 hour(s))  Fetal fibronectin     Status: None   Collection Time: 05/10/16  8:44 PM  Result Value Ref Range   Fetal Fibronectin NEGATIVE NEGATIVE   Appearance, FETFIB  CLEAR CLEAR  Urinalysis complete, with microscopic ([facility] only)     Status: Abnormal   Collection Time: 05/10/16  9:20 PM  Result Value Ref Range   Color, Urine YELLOW (A) YELLOW   APPearance HAZY (A) CLEAR   Glucose, UA NEGATIVE NEGATIVE mg/dL   Bilirubin Urine NEGATIVE NEGATIVE   Ketones, ur NEGATIVE NEGATIVE mg/dL   Specific Gravity, Urine 1.016 1.005 - 1.030   Hgb urine dipstick NEGATIVE NEGATIVE   pH 7.0 5.0 - 8.0   Protein, ur 30 (A) NEGATIVE mg/dL   Nitrite NEGATIVE NEGATIVE   Leukocytes, UA NEGATIVE NEGATIVE   RBC / HPF 0-5 0 - 5 RBC/hpf   WBC, UA 0-5 0 - 5 WBC/hpf   Bacteria, UA RARE (A) NONE SEEN   Squamous Epithelial / LPF 6-30 (A) NONE SEEN  A: IUP at 28.1 weeks, not in labor Reactive NST Some proteinuria, but normotensive and with no other indication of a UTI P: DC home and follow up with prenatal provider Urine culture pending  Procedures: NST  Discharge Condition: stable  Disposition: 01-Home or Self Care  Diet: Regular diet  Discharge Activity: Activity as tolerated     Medication List    TAKE these medications   ferrous sulfate 325 (65 FE) MG tablet Commonly known as:  FERROUSUL Take 1 tablet (325 mg total) by mouth 2 (two) times daily with a meal.   Hydrocodone-Acetaminophen 5-300 MG Tabs TAKE 1 TABLET BY MOUTH EVERY 4 TO 6 HOURS AS NEEDED FOR PAIN   prenatal multivitamin Tabs tablet Take 1 tablet by mouth daily  at 12 noon. Reported on 12/17/2015   promethazine 25 MG tablet Commonly known as:  PHENERGAN Take 1 tablet (25 mg total) by mouth every 6 (six) hours as needed for nausea or vomiting.   terconazole 0.8 % vaginal cream Commonly known as:  TERAZOL 3 Place 1 applicator vaginally at bedtime.        Total time spent taking care of this patient: 20 minutes  Signed: Marques Ericson 05/10/2016, 11:12 PM

## 2016-05-11 LAB — HIV 1/2 CONFIRMATION
HIV 2 AB: NEGATIVE
HIV-1 antibody: NEGATIVE

## 2016-05-11 LAB — HIV ANTIBODY (ROUTINE TESTING W REFLEX): HIV 1&2 Ab, 4th Generation: REACTIVE — AB

## 2016-05-12 ENCOUNTER — Telehealth: Payer: Self-pay | Admitting: *Deleted

## 2016-05-12 LAB — URINE CULTURE

## 2016-05-12 MED ORDER — METRONIDAZOLE 500 MG PO TABS: 500.0000 mg | ORAL_TABLET | Freq: Two times a day (BID) | ORAL | 0 refills | Status: DC

## 2016-05-12 NOTE — Telephone Encounter (Signed)
Pt called in stating her Sx are no better after using Terazol Cream - Sent Flagyl per notes from Dr Shawnie PonsPratt at last office visit.

## 2016-05-15 ENCOUNTER — Telehealth: Payer: Self-pay | Admitting: *Deleted

## 2016-05-15 LAB — HIV-1 RNA, QUALITATIVE, TMA: HIV-1 RNA, QUAL: NOT DETECTED

## 2016-05-15 NOTE — Telephone Encounter (Signed)
Rtn'd call to patient to go over lab results. LMOM for her to rtn call if needed.

## 2016-05-15 NOTE — Telephone Encounter (Signed)
-----   Message from Olevia BowensJacinda S Battle sent at 05/15/2016 10:16 AM EDT ----- Regarding: Lab Results Contact: [phone] Saw Abnormal Urine Results in her MyChart, wants to know whta they are, needs someone to explain them to her

## 2016-05-21 ENCOUNTER — Encounter: Payer: Commercial Managed Care - HMO | Admitting: Family Medicine

## 2016-05-26 ENCOUNTER — Ambulatory Visit (INDEPENDENT_AMBULATORY_CARE_PROVIDER_SITE_OTHER): Payer: Commercial Managed Care - HMO | Admitting: Obstetrics and Gynecology

## 2016-05-26 VITALS — BP 107/68 | HR 101 | Wt 197.0 lb

## 2016-05-26 DIAGNOSIS — O99013 Anemia complicating pregnancy, third trimester: Secondary | ICD-10-CM | POA: Diagnosis not present

## 2016-05-26 DIAGNOSIS — O99019 Anemia complicating pregnancy, unspecified trimester: Secondary | ICD-10-CM

## 2016-05-26 DIAGNOSIS — D573 Sickle-cell trait: Secondary | ICD-10-CM

## 2016-05-26 DIAGNOSIS — O09523 Supervision of elderly multigravida, third trimester: Secondary | ICD-10-CM

## 2016-05-26 DIAGNOSIS — O288 Other abnormal findings on antenatal screening of mother: Secondary | ICD-10-CM

## 2016-05-26 DIAGNOSIS — Z3493 Encounter for supervision of normal pregnancy, unspecified, third trimester: Secondary | ICD-10-CM

## 2016-05-26 DIAGNOSIS — O283 Abnormal ultrasonic finding on antenatal screening of mother: Secondary | ICD-10-CM

## 2016-05-26 DIAGNOSIS — N870 Mild cervical dysplasia: Secondary | ICD-10-CM | POA: Diagnosis not present

## 2016-05-26 NOTE — Progress Notes (Signed)
Prenatal Visit Note Date: 05/26/2016 Clinic: [facility]  Subjective:  Heidi Woods is a 35 y.o. [id number] at 2129w4d being seen today for ongoing prenatal care.  She is currently monitored for the following issues for this high-risk pregnancy and has Supervision of normal pregnancy; Sickle cell trait (HCC); Advanced maternal age in multigravida; Anemia affecting pregnancy; Increased nuchal translucency space on fetal ultrasound; Dysplasia of cervix, low grade (CIN 1); and Indication for care in labor or delivery on her problem list.  Patient reports no overt s/s of anemia. She does state that she sometimes gets dizzy when she sits up really quickly.   Contractions: Irregular. Vag. Bleeding: None.  Movement: Present. Denies leaking of fluid.   The following portions of the patient's history were reviewed and updated as appropriate: allergies, current medications, past family history, past medical history, past social history, past surgical history and problem list. Problem list updated.  Objective:   Vitals:   05/26/16 1633  BP: 107/68  Pulse: (!) 101  Weight: 197 lb (89.4 kg)    Fetal Status: Fetal Heart Rate (bpm): 151 Fundal Height: 30 cm Movement: Present     General:  Alert, oriented and cooperative. Patient is in no acute distress.  Skin: Skin is warm and dry. No rash noted.   Cardiovascular: Normal heart rate noted  Respiratory: Normal respiratory effort, no problems with respiration noted  Abdomen: Soft, gravid, appropriate for gestational age. Pain/Pressure: Present     Pelvic:  Cervical exam deferred        Extremities: Normal range of motion.  Edema: None  Mental Status: Normal mood and affect. Normal behavior. Normal judgment and thought content.   Urinalysis:      Assessment and Plan:  Pregnancy: [id number] at 729w4d  1. Supervision of normal pregnancy, third trimester Routine prenatal care. Negative 28wk labs  2. Sickle cell trait  (HCC) See below  3. Anemia affecting pregnancy Is taking prenatal and qday PO iron. Still anemic despite June IV iron infusion. Given persistent anemia, will refer to Heme for consideration for any other interventions to help get her levels closer to normal pre delivery. Growth u/s already scheduled for two weeks from now.  Patient told to increase iron to bid  CBC Latest Ref Rng & Units 05/09/2016 03/05/2016 02/18/2016  WBC 3.8 - 10.8 K/uL 9.8 - 12.2(H)  Hemoglobin 11.7 - 15.5 g/dL 7.6(L) - 7.0(L)  Hematocrit 35.0 - 45.0 % 22.8(L) 18.8(L) 21.7(L)  Platelets 140 - 400 K/uL 162 - 193    4. Dysplasia of cervix, low grade (CIN 1) 01/2016 cin bx x 2 after LSIL pap. Likely just repeat pap in 01/2017  5. Advanced maternal age in multigravida, third trimester S/p negative cffdna and fetal echo  6. Increased nuchal translucency space on fetal ultrasound See above  Preterm labor symptoms and general obstetric precautions including but not limited to vaginal bleeding, contractions, leaking of fluid and fetal movement were reviewed in detail with the patient. Please refer to After Visit Summary for other counseling recommendations.  Return in about 2 weeks (around 06/09/2016).   Holcomb Bingharlie Linnae Rasool, MD

## 2016-05-29 ENCOUNTER — Encounter: Payer: Self-pay | Admitting: Obstetrics & Gynecology

## 2016-05-29 DIAGNOSIS — D582 Other hemoglobinopathies: Secondary | ICD-10-CM | POA: Insufficient documentation

## 2016-05-29 HISTORY — DX: Other hemoglobinopathies: D58.2

## 2016-06-06 ENCOUNTER — Other Ambulatory Visit (HOSPITAL_COMMUNITY): Payer: Self-pay | Admitting: Maternal and Fetal Medicine

## 2016-06-06 ENCOUNTER — Ambulatory Visit (HOSPITAL_COMMUNITY)
Admission: RE | Admit: 2016-06-06 | Discharge: 2016-06-06 | Disposition: A | Discharge status: Home / Self Care | Payer: Commercial Managed Care - HMO | Source: Ambulatory Visit | Attending: Obstetrics & Gynecology | Admitting: Obstetrics & Gynecology

## 2016-06-06 ENCOUNTER — Encounter (HOSPITAL_COMMUNITY): Payer: Self-pay

## 2016-06-06 DIAGNOSIS — O09523 Supervision of elderly multigravida, third trimester: Secondary | ICD-10-CM | POA: Diagnosis present

## 2016-06-06 DIAGNOSIS — Z3A32 32 weeks gestation of pregnancy: Secondary | ICD-10-CM | POA: Diagnosis not present

## 2016-06-06 DIAGNOSIS — D582 Other hemoglobinopathies: Secondary | ICD-10-CM

## 2016-06-06 DIAGNOSIS — O99019 Anemia complicating pregnancy, unspecified trimester: Secondary | ICD-10-CM

## 2016-06-06 DIAGNOSIS — Z3493 Encounter for supervision of normal pregnancy, unspecified, third trimester: Secondary | ICD-10-CM

## 2016-06-06 DIAGNOSIS — O09529 Supervision of elderly multigravida, unspecified trimester: Secondary | ICD-10-CM

## 2016-06-06 DIAGNOSIS — O283 Abnormal ultrasonic finding on antenatal screening of mother: Secondary | ICD-10-CM

## 2016-06-06 NOTE — Addendum Note (Signed)
Encounter addended by: Vanetta Shawlarolyn H Jakyrah Holladay, RT, RVT, RDMS on: 06/06/2016  5:38 PM<BR>    Actions taken: Imaging Exam ended

## 2016-06-09 ENCOUNTER — Other Ambulatory Visit (HOSPITAL_COMMUNITY): Payer: Self-pay | Admitting: *Deleted

## 2016-06-09 ENCOUNTER — Ambulatory Visit (INDEPENDENT_AMBULATORY_CARE_PROVIDER_SITE_OTHER): Payer: Commercial Managed Care - HMO | Admitting: Obstetrics and Gynecology

## 2016-06-09 VITALS — BP 113/71 | HR 105

## 2016-06-09 DIAGNOSIS — Z3493 Encounter for supervision of normal pregnancy, unspecified, third trimester: Secondary | ICD-10-CM

## 2016-06-09 DIAGNOSIS — O99019 Anemia complicating pregnancy, unspecified trimester: Secondary | ICD-10-CM

## 2016-06-09 DIAGNOSIS — D649 Anemia, unspecified: Secondary | ICD-10-CM

## 2016-06-09 DIAGNOSIS — O09523 Supervision of elderly multigravida, third trimester: Secondary | ICD-10-CM

## 2016-06-09 DIAGNOSIS — O283 Abnormal ultrasonic finding on antenatal screening of mother: Secondary | ICD-10-CM

## 2016-06-09 DIAGNOSIS — O99013 Anemia complicating pregnancy, third trimester: Secondary | ICD-10-CM

## 2016-06-09 NOTE — Progress Notes (Signed)
   PRENATAL VISIT NOTE  Subjective:  Heidi Woods is a 35 y.o. [id number] at 7162w4d being seen today for ongoing prenatal care.  She is currently monitored for the following issues for this high-risk pregnancy and has Supervision of normal pregnancy; Hemoglobin C trait (HCC); Advanced maternal age in multigravida; Anemia affecting pregnancy; Increased nuchal translucency space on fetal ultrasound; Dysplasia of cervix, low grade (CIN 1); Indication for care in labor or delivery; and Hemoglobin O Arab trait (HCC) on her problem list.  Patient reports no complaints.  Contractions: Irregular. Vag. Bleeding: None.  Movement: Present. Denies leaking of fluid.   The following portions of the patient's history were reviewed and updated as appropriate: allergies, current medications, past family history, past medical history, past social history, past surgical history and problem list. Problem list updated.  Objective:   Vitals:   06/09/16 1550  BP: 113/71  Pulse: (!) 105    Fetal Status: Fetal Heart Rate (bpm): 147 Fundal Height: 32 cm Movement: Present     General:  Alert, oriented and cooperative. Patient is in no acute distress.  Skin: Skin is warm and dry. No rash noted.   Cardiovascular: Normal heart rate noted  Respiratory: Normal respiratory effort, no problems with respiration noted  Abdomen: Soft, gravid, appropriate for gestational age. Pain/Pressure: Present     Pelvic:  Cervical exam deferred        Extremities: Normal range of motion.  Edema: None  Mental Status: Normal mood and affect. Normal behavior. Normal judgment and thought content.   Urinalysis: Urine Protein: 1+ Urine Glucose: Negative  Assessment and Plan:  Pregnancy: [id number] at 6662w4d  1. Supervision of normal pregnancy, third trimester Patient is doing well without complaints Growth ultrasound scheduled on 10/20  2. Advanced maternal age in multigravida, third trimester Normal NIPS  3. Anemia affecting  pregnancy Hem onc appointment on 10/2 Continue PNV and daily iron supplement  Preterm labor symptoms and general obstetric precautions including but not limited to vaginal bleeding, contractions, leaking of fluid and fetal movement were reviewed in detail with the patient. Please refer to After Visit Summary for other counseling recommendations.  Return in about 2 weeks (around 06/23/2016).  Catalina AntiguaPeggy Hennie Gosa, MD

## 2016-06-12 ENCOUNTER — Encounter: Payer: Self-pay | Admitting: *Deleted

## 2016-06-15 DIAGNOSIS — D509 Iron deficiency anemia, unspecified: Secondary | ICD-10-CM | POA: Insufficient documentation

## 2016-06-15 DIAGNOSIS — D649 Anemia, unspecified: Secondary | ICD-10-CM | POA: Insufficient documentation

## 2016-06-15 DIAGNOSIS — O99013 Anemia complicating pregnancy, third trimester: Secondary | ICD-10-CM

## 2016-06-15 NOTE — Progress Notes (Signed)
[facility]  Telephone:(336) [phone] Fax:(336) [phone]  ID: Heidi FriskAlexis Woods OB: 13-Nov-1980  MR#: [id number]  CSN#:[id number]  No care team member to display  CHIEF COMPLAINT:  Iron deficiency anemia in third trimester of pregnancy.  INTERVAL HISTORY: Patient is a 35 year old female who was noted to have a declining hemoglobin and iron stores in the third trimester of her pregnancy. She reports receiving one infusion of Feraheme in early June 2017. She currently feels well and is asymptomatic. She has no neurologic complaints. She does not complain of weakness or fatigue today. She has a good appetite and is gaining weight appropriately. She denies any chest pain or shortness of breath. She has no nausea, vomiting, constipation, or diarrhea. She has no urinary complaints. Patient feels at her baseline and offers no specific complaints today.  REVIEW OF SYSTEMS:   Review of Systems  Constitutional: Positive for weight loss. Negative for fever and malaise/fatigue.  Cardiovascular: Negative.  Negative for chest pain.  Neurological: Negative for weakness.    As per HPI. Otherwise, a complete review of systems is negative.  PAST MEDICAL HISTORY: Past Medical History:  Diagnosis Date  . 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 Heterozygotes are clinically asymptomatic with no hematologic abnormalities, while those homozygous for HbO-Arab may have a mild, asymptomatic, compensated hemolytic anemia.  Hemoglobin O-Arab is of clinical   . LGSIL (low grade squamous intraepithelial lesion) on Pap smear 04/05/2013   Also had abnormal pap smear at age 8-22, had normal colpo and paps until 2014     PAST SURGICAL HISTORY: Past Surgical History:  Procedure Laterality Date  . CHOLECYSTECTOMY  2012  . INDUCED ABORTION     x 7  . umbbilical hernia repair      FAMILY HISTORY: Family History  Problem Relation Age of Onset  . Sickle cell anemia Father   . Cancer Father     prostate  . Cancer Maternal Grandfather     prostate  . Hypertension Maternal Grandfather   . Cancer Paternal Grandfather     throat, lung  . Hypertension Maternal Grandmother   . Cancer Maternal Grandmother     colon    ADVANCED DIRECTIVES (Y/N):  N  HEALTH MAINTENANCE: Social History  Substance Use Topics  . Smoking status: Never Smoker  . Smokeless tobacco: Never Used  . Alcohol use No     Colonoscopy:  PAP:  Bone density:  Lipid panel:  No Known Allergies  Current Outpatient Prescriptions  Medication Sig Dispense Refill  . ferrous sulfate (FERROUSUL) 325 (65 FE) MG tablet Take 1 tablet (325 mg total) by mouth 2 (two) times daily with a meal. 60 tablet 2  . HYDROcodone-acetaminophen (NORCO/VICODIN) 5-325 MG tablet Take 1 tablet by mouth every 6 (six) hours as needed for moderate pain.    . Prenatal Vit-Fe Fumarate-FA (PRENATAL MULTIVITAMIN) TABS tablet Take 1 tablet by mouth daily at 12 noon. Reported on 12/17/2015    . promethazine (PHENERGAN) 25 MG tablet Take 1 tablet (25 mg total) by mouth every 6 (six) hours as needed for nausea or vomiting. 30 tablet 0   No current facility-administered medications for this visit.     OBJECTIVE: Vitals:   06/16/16 1537  BP: 119/72  Pulse: 100  Resp: 18  Temp: 98.8 F (37.1 C)     Body mass index is 30.54 kg/m.    ECOG FS:0 - Asymptomatic  General: Well-developed, well-nourished, no acute distress. Eyes: Pink conjunctiva, anicteric sclera. HEENT: Normocephalic, moist mucous membranes, clear oropharnyx. Lungs: Clear to auscultation bilaterally. Heart: Regular rate and rhythm. No rubs, murmurs, or gallops. Abdomen: Appears appropriate for  gestational age. Musculoskeletal: No edema, cyanosis, or clubbing. Neuro: Alert, answering all questions appropriately. Cranial nerves grossly intact. Skin: No rashes or petechiae noted. Psych: Normal affect. Lymphatics: No cervical, calvicular, axillary or inguinal LAD.   LAB RESULTS:  Lab Results  Component Value Date   NA 136 04/04/2013   K 3.3 (L) 04/04/2013   CL 107 04/04/2013   CO2 23 04/04/2013   GLUCOSE 104 (H) 04/04/2013   BUN 11 04/04/2013   CREATININE 0.66 04/04/2013   CALCIUM 8.9 04/04/2013   GFRNONAA >60 04/04/2013   GFRAA >60 04/04/2013    Lab Results  Component Value Date   WBC 10.4 06/16/2016   NEUTROABS 4,420 12/17/2015   HGB 8.6 (L) 06/16/2016   HCT 24.7 (L) 06/16/2016   MCV 69.1 (L) 06/16/2016   PLT 172 06/16/2016   Lab Results  Component Value Date   IRON 57 06/16/2016   TIBC 539 (H) 06/16/2016   IRONPCTSAT 11 06/16/2016   Lab Results  Component Value Date   FERRITIN 7 (L) 06/16/2016     STUDIES: [place] Mfm Ob Follow Up  Result Date: 06/06/2016 OBSTETRICAL ULTRASOUND: This exam was performed within a [facility]. The OB US report was generated in the AS system, and faxed to the ordering physician.  This report is available in the [employer]. See the AS Obstetric US report via the Image Link.   ASSESSMENT: Iron deficiency anemia in third trimester of pregnancy  PLAN:    1.  Iron deficiency anemia in third trimester of pregnancy: Patient's hemoglobin has improved with one infusion of Feraheme in early June 2017, but her iron stores are still significantly decreased. Patient has mild B 12 deficiency, but the remainder of her laboratory work was either negative or within normal limits.  Return to clinic in 2 weeks for further evaluation and initiation of IV Feraheme. Patient will likely require a second infusion as well.  2. Pregnancy: Patient is due date is approximately July 29, 2016. Continue follow-up appointments as  scheduled with obstetrics.   Patient expressed understanding and was in agreement with this plan. She also understands that She can call clinic at any time with any questions, concerns, or complaints.    Jeralyn Ruths, MD   06/17/2016 2:51 PM

## 2016-06-16 ENCOUNTER — Inpatient Hospital Stay: Payer: Commercial Managed Care - HMO | Attending: Oncology | Admitting: Oncology

## 2016-06-16 ENCOUNTER — Inpatient Hospital Stay: Payer: Commercial Managed Care - HMO

## 2016-06-16 ENCOUNTER — Encounter: Payer: Self-pay | Admitting: Oncology

## 2016-06-16 VITALS — BP 119/72 | HR 100 | Temp 98.8°F | Resp 18 | Wt 200.8 lb

## 2016-06-16 DIAGNOSIS — E538 Deficiency of other specified B group vitamins: Secondary | ICD-10-CM | POA: Diagnosis not present

## 2016-06-16 DIAGNOSIS — Z79899 Other long term (current) drug therapy: Secondary | ICD-10-CM

## 2016-06-16 DIAGNOSIS — O99013 Anemia complicating pregnancy, third trimester: Secondary | ICD-10-CM

## 2016-06-16 DIAGNOSIS — D509 Iron deficiency anemia, unspecified: Secondary | ICD-10-CM

## 2016-06-16 LAB — CBC
HEMATOCRIT: 24.7 % — AB (ref 35.0–47.0)
HEMOGLOBIN: 8.6 g/dL — AB (ref 12.0–16.0)
MCH: 24.1 pg — ABNORMAL LOW (ref 26.0–34.0)
MCHC: 34.9 g/dL (ref 32.0–36.0)
MCV: 69.1 fL — AB (ref 80.0–100.0)
Platelets: 172 10*3/uL (ref 150–440)
RBC: 3.57 MIL/uL — AB (ref 3.80–5.20)
RDW: 24 % — AB (ref 11.5–14.5)
WBC: 10.4 10*3/uL (ref 3.6–11.0)

## 2016-06-16 LAB — IRON AND TIBC
IRON: 57 ug/dL (ref 28–170)
SATURATION RATIOS: 11 % (ref 10.4–31.8)
TIBC: 539 ug/dL — AB (ref 250–450)
UIBC: 482 ug/dL

## 2016-06-16 LAB — FERRITIN: Ferritin: 7 ng/mL — ABNORMAL LOW (ref 11–307)

## 2016-06-16 LAB — FOLATE: Folate: 13.5 ng/mL (ref >5.9)

## 2016-06-16 LAB — VITAMIN B12: VITAMIN B 12: 122 pg/mL — AB (ref 180–914)

## 2016-06-16 LAB — DAT, POLYSPECIFIC AHG (ARMC ONLY): POLYSPECIFIC AHG TEST: NEGATIVE

## 2016-06-16 NOTE — Progress Notes (Signed)
New evaluation for low blood count. Pt is currently [redacted] weeks pregnant. Has mild contractions.

## 2016-06-17 LAB — HAPTOGLOBIN: Haptoglobin: 12 mg/dL — ABNORMAL LOW (ref 34–200)

## 2016-06-23 ENCOUNTER — Ambulatory Visit (INDEPENDENT_AMBULATORY_CARE_PROVIDER_SITE_OTHER): Payer: Commercial Managed Care - HMO | Admitting: Obstetrics & Gynecology

## 2016-06-23 DIAGNOSIS — Z3483 Encounter for supervision of other normal pregnancy, third trimester: Secondary | ICD-10-CM

## 2016-06-23 NOTE — Patient Instructions (Signed)
Return to clinic for any scheduled appointments or obstetric concerns, or go to MAU for evaluation  

## 2016-06-23 NOTE — Progress Notes (Signed)
   PRENATAL VISIT NOTE  Subjective:  Heidi Woods is a 35 y.o. [id number] at 782w4d being seen today for ongoing prenatal care.  She is currently monitored for the following issues for this low-risk pregnancy and has Supervision of normal pregnancy; Hemoglobin C trait (HCC); Advanced maternal age in multigravida; Anemia affecting pregnancy; Increased nuchal translucency space on fetal ultrasound; Dysplasia of cervix, low grade (CIN 1); Indication for care in labor or delivery; Hemoglobin O Arab trait (HCC); and Maternal iron deficiency anemia affecting pregnancy in third trimester, antepartum on her problem list.  Patient reports no complaints.  Contractions: Irregular. Vag. Bleeding: None.  Movement: Present. Denies leaking of fluid.   The following portions of the patient's history were reviewed and updated as appropriate: allergies, current medications, past family history, past medical history, past social history, past surgical history and problem list. Problem list updated.  Objective:   Vitals:   06/23/16 1415  BP: 117/72  Pulse: 89  Weight: 199 lb (90.3 kg)    Fetal Status: Fetal Heart Rate (bpm): 153 Fundal Height: 34 cm Movement: Present     General:  Alert, oriented and cooperative. Patient is in no acute distress.  Skin: Skin is warm and dry. No rash noted.   Cardiovascular: Normal heart rate noted  Respiratory: Normal respiratory effort, no problems with respiration noted  Abdomen: Soft, gravid, appropriate for gestational age. Pain/Pressure: Present     Pelvic:  Cervical exam deferred        Extremities: Normal range of motion.  Edema: Trace  Mental Status: Normal mood and affect. Normal behavior. Normal judgment and thought content.   Urinalysis: Urine Protein: 1+ Urine Glucose: Negative  Assessment and Plan:  Pregnancy: [id number] at 622w4d  1. Encounter for supervision of other normal pregnancy in third trimester Preterm labor symptoms and general obstetric precautions  including but not limited to vaginal bleeding, contractions, leaking of fluid and fetal movement were reviewed in detail with the patient. Please refer to After Visit Summary for other counseling recommendations.  Return in about 2 weeks (around 07/07/2016) for OB Visit, Pelvic cultures.  Tereso NewcomerUgonna A Pricilla Moehle, MD

## 2016-06-27 ENCOUNTER — Other Ambulatory Visit: Payer: Self-pay | Admitting: *Deleted

## 2016-06-27 DIAGNOSIS — D649 Anemia, unspecified: Secondary | ICD-10-CM

## 2016-06-29 NOTE — Progress Notes (Signed)
[facility]  Telephone:(336) [phone] Fax:(336) [phone]  ID: Heidi FriskAlexis Woods OB: Dec 28, 1980  MR#: [id number]  AOZ#:[id number]#:[id number]  No care team member to display  CHIEF COMPLAINT:  Iron deficiency anemia in third trimester of pregnancy.  INTERVAL HISTORY: Patient returns to clinic today for further evaluation and initiation of Feraheme. She currently feels well and is asymptomatic. She has no neurologic complaints. She does not complain of weakness or fatigue today. She has a good appetite and is gaining weight appropriately. She denies any chest pain or shortness of breath. She has no nausea, vomiting, constipation, or diarrhea. She has no urinary complaints. Patient feels at her baseline and offers no specific complaints today.  REVIEW OF SYSTEMS:   Review of Systems  Constitutional: Negative for fever, malaise/fatigue and weight loss.  Respiratory: Negative.  Negative for cough.   Cardiovascular: Negative.  Negative for chest pain and leg swelling.  Gastrointestinal: Negative.  Negative for abdominal pain, blood in stool and melena.  Genitourinary: Negative.   Musculoskeletal: Negative.   Neurological: Negative.  Negative for weakness.  Psychiatric/Behavioral: Negative.  The patient is not nervous/anxious.     As per HPI. Otherwise, a complete review of systems is negative.  PAST MEDICAL HISTORY: Past Medical History:  Diagnosis Date  . 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 Heterozygotes are clinically asymptomatic with no hematologic abnormalities, while those homozygous for HbO-Arab may have a mild, asymptomatic, compensated hemolytic anemia.  Hemoglobin O-Arab is of clinical     . LGSIL (low grade squamous intraepithelial lesion) on Pap smear 04/05/2013   Also had abnormal pap smear at age 62-22, had normal colpo and paps until 2014    PAST SURGICAL HISTORY: Past Surgical History:  Procedure Laterality Date  . CHOLECYSTECTOMY  2012  . INDUCED ABORTION     x 7  . umbbilical hernia repair      FAMILY HISTORY: Family History  Problem Relation Age of Onset  . Sickle cell anemia Father   . Cancer Father     prostate  . Cancer Maternal Grandfather     prostate  . Hypertension Maternal Grandfather   . Cancer Paternal Grandfather     throat, lung  . Hypertension Maternal Grandmother   . Cancer Maternal Grandmother     colon    ADVANCED DIRECTIVES (Y/N):  N  HEALTH MAINTENANCE: Social History  Substance Use Topics  . Smoking status: Never Smoker  . Smokeless tobacco: Never Used  . Alcohol use No     Colonoscopy:  PAP:  Bone density:  Lipid panel:  No Known Allergies  Current Outpatient Prescriptions  Medication Sig Dispense Refill  . ferrous sulfate (FERROUSUL) 325 (65 FE) MG tablet Take 1 tablet (325 mg total) by mouth 2 (two) times daily with a meal. 60 tablet 2  . Prenatal Vit-Fe Fumarate-FA (PRENATAL MULTIVITAMIN) TABS tablet Take 1 tablet by mouth daily at 12 noon. Reported on 12/17/2015    . promethazine (PHENERGAN) 25 MG tablet Take 1 tablet (25 mg total) by mouth every 6 (six) hours as needed for nausea or vomiting. 30 tablet 0   No current facility-administered medications for this visit.     OBJECTIVE: Vitals:   06/30/16 1406  BP:  114/70  Pulse: (!) 107  Resp: 18  Temp: 98.8 F (37.1 C)     Body mass index is 31.02 kg/m.    ECOG FS:0 - Asymptomatic  General: Well-developed, well-nourished, no acute distress. Eyes: Pink conjunctiva, anicteric sclera. Lungs: Clear to auscultation bilaterally. Heart: Regular rate and rhythm. No rubs, murmurs, or gallops. Abdomen: Appears appropriate for gestational age. Musculoskeletal: No  edema, cyanosis, or clubbing. Neuro: Alert, answering all questions appropriately. Cranial nerves grossly intact. Skin: No rashes or petechiae noted. Psych: Normal affect.  LAB RESULTS:  Lab Results  Component Value Date   NA 136 04/04/2013   K 3.3 (L) 04/04/2013   CL 107 04/04/2013   CO2 23 04/04/2013   GLUCOSE 104 (H) 04/04/2013   BUN 11 04/04/2013   CREATININE 0.66 04/04/2013   CALCIUM 8.9 04/04/2013   GFRNONAA >60 04/04/2013   GFRAA >60 04/04/2013    Lab Results  Component Value Date   WBC 9.8 06/30/2016   NEUTROABS 7.8 (H) 06/30/2016   HGB 8.1 (L) 06/30/2016   HCT 23.6 (L) 06/30/2016   MCV 68.2 (L) 06/30/2016   PLT 167 06/30/2016   Lab Results  Component Value Date   IRON 46 06/30/2016   TIBC 510 (H) 06/30/2016   IRONPCTSAT 9 (L) 06/30/2016   Lab Results  Component Value Date   FERRITIN 7 (L) 06/30/2016     STUDIES: [place] Mfm Ob Follow Up  Result Date: 06/06/2016 OBSTETRICAL ULTRASOUND: This exam was performed within a [facility]. The OB US report was generated in the AS system, and faxed to the ordering physician.  This report is available in the [employer]. See the AS Obstetric US report via the Image Link.   ASSESSMENT: Iron deficiency anemia in third trimester of pregnancy  PLAN:    1.  Iron deficiency anemia in third trimester of pregnancy: Patient's hemoglobin has improved with one infusion of Feraheme in early June 2017, but Is still significantly decreased. Her her iron stores are still significantly decreased. Patient has mild B12 deficiency, but the remainder of her laboratory work was either negative or within normal limits.  Proceed with 510 mg IV Feraheme today. Return to clinic in 1 week for a second infusion. Patient will then return to clinic in 3 months for repeat laboratory work and assess the need for additional IV iron.   2. Pregnancy: Patient is due date is approximately July 31, 2016. Continue follow-up  appointments as scheduled with obstetrics.   Patient expressed understanding and was in agreement with this plan. She also understands that She can call clinic at any time with any questions, concerns, or complaints.    Jeralyn Ruths, MD   06/30/2016 7:01 PM

## 2016-06-30 ENCOUNTER — Inpatient Hospital Stay (HOSPITAL_BASED_OUTPATIENT_CLINIC_OR_DEPARTMENT_OTHER): Payer: Commercial Managed Care - HMO | Admitting: Oncology

## 2016-06-30 ENCOUNTER — Inpatient Hospital Stay: Payer: Commercial Managed Care - HMO

## 2016-06-30 VITALS — BP 114/70 | HR 107 | Temp 98.8°F | Resp 18 | Wt 204.0 lb

## 2016-06-30 DIAGNOSIS — O99013 Anemia complicating pregnancy, third trimester: Secondary | ICD-10-CM

## 2016-06-30 DIAGNOSIS — E538 Deficiency of other specified B group vitamins: Secondary | ICD-10-CM

## 2016-06-30 DIAGNOSIS — D509 Iron deficiency anemia, unspecified: Secondary | ICD-10-CM | POA: Diagnosis not present

## 2016-06-30 DIAGNOSIS — D649 Anemia, unspecified: Secondary | ICD-10-CM

## 2016-06-30 DIAGNOSIS — Z79899 Other long term (current) drug therapy: Secondary | ICD-10-CM | POA: Diagnosis not present

## 2016-06-30 LAB — CBC WITH DIFFERENTIAL/PLATELET
BASOS ABS: 0 10*3/uL (ref 0–0.1)
BLASTS: 0 %
Band Neutrophils: 0 %
Basophils Relative: 0 %
EOS PCT: 0 %
Eosinophils Absolute: 0 10*3/uL (ref 0–0.7)
HEMATOCRIT: 23.6 % — AB (ref 35.0–47.0)
HEMOGLOBIN: 8.1 g/dL — AB (ref 12.0–16.0)
LYMPHS ABS: 1.9 10*3/uL (ref 1.0–3.6)
Lymphocytes Relative: 19 %
MCH: 23.5 pg — ABNORMAL LOW (ref 26.0–34.0)
MCHC: 34.4 g/dL (ref 32.0–36.0)
MCV: 68.2 fL — AB (ref 80.0–100.0)
MONOS PCT: 1 %
MYELOCYTES: 0 %
Metamyelocytes Relative: 0 %
Monocytes Absolute: 0.1 10*3/uL — ABNORMAL LOW (ref 0.2–0.9)
NEUTROS PCT: 80 %
NRBC: 0 /100{WBCs}
Neutro Abs: 7.8 10*3/uL — ABNORMAL HIGH (ref 1.4–6.5)
Other: 0 %
Platelets: 167 10*3/uL (ref 150–440)
Promyelocytes Absolute: 0 %
RBC: 3.46 MIL/uL — AB (ref 3.80–5.20)
RDW: 24.1 % — AB (ref 11.5–14.5)
WBC: 9.8 10*3/uL (ref 3.6–11.0)

## 2016-06-30 LAB — IRON AND TIBC
IRON: 46 ug/dL (ref 28–170)
SATURATION RATIOS: 9 % — AB (ref 10.4–31.8)
TIBC: 510 ug/dL — AB (ref 250–450)
UIBC: 464 ug/dL

## 2016-06-30 LAB — FERRITIN: Ferritin: 7 ng/mL — ABNORMAL LOW (ref 11–307)

## 2016-06-30 NOTE — Progress Notes (Signed)
States is feeling well. Offers no complaints. 

## 2016-07-01 ENCOUNTER — Inpatient Hospital Stay: Payer: Commercial Managed Care - HMO

## 2016-07-01 ENCOUNTER — Ambulatory Visit: Payer: Commercial Managed Care - HMO

## 2016-07-01 VITALS — BP 105/70 | HR 103 | Temp 97.8°F | Resp 18

## 2016-07-01 DIAGNOSIS — O99019 Anemia complicating pregnancy, unspecified trimester: Secondary | ICD-10-CM

## 2016-07-01 DIAGNOSIS — O99013 Anemia complicating pregnancy, third trimester: Secondary | ICD-10-CM | POA: Diagnosis not present

## 2016-07-01 MED ORDER — SODIUM CHLORIDE 0.9 % IV SOLN
Freq: Once | INTRAVENOUS | Status: AC
  Administered 2016-07-01: 15:00:00 via INTRAVENOUS
  Filled 2016-07-01: qty 1000

## 2016-07-01 MED ORDER — SODIUM CHLORIDE 0.9 % IV SOLN
510.0000 mg | Freq: Once | INTRAVENOUS | Status: AC
  Administered 2016-07-01: 510 mg via INTRAVENOUS
  Filled 2016-07-01: qty 17

## 2016-07-04 ENCOUNTER — Ambulatory Visit (HOSPITAL_COMMUNITY)
Admission: RE | Admit: 2016-07-04 | Discharge: 2016-07-04 | Disposition: A | Discharge status: Home / Self Care | Payer: Commercial Managed Care - HMO | Source: Ambulatory Visit | Attending: Obstetrics & Gynecology | Admitting: Obstetrics & Gynecology

## 2016-07-04 ENCOUNTER — Encounter (HOSPITAL_COMMUNITY): Payer: Self-pay

## 2016-07-04 DIAGNOSIS — Z862 Personal history of diseases of the blood and blood-forming organs and certain disorders involving the immune mechanism: Secondary | ICD-10-CM | POA: Diagnosis not present

## 2016-07-04 DIAGNOSIS — Z3A36 36 weeks gestation of pregnancy: Secondary | ICD-10-CM | POA: Diagnosis not present

## 2016-07-04 DIAGNOSIS — Z3483 Encounter for supervision of other normal pregnancy, third trimester: Secondary | ICD-10-CM

## 2016-07-04 DIAGNOSIS — O283 Abnormal ultrasonic finding on antenatal screening of mother: Secondary | ICD-10-CM | POA: Diagnosis not present

## 2016-07-04 DIAGNOSIS — D582 Other hemoglobinopathies: Secondary | ICD-10-CM

## 2016-07-04 DIAGNOSIS — O358XX0 Maternal care for other (suspected) fetal abnormality and damage, not applicable or unspecified: Secondary | ICD-10-CM | POA: Diagnosis present

## 2016-07-04 DIAGNOSIS — O99019 Anemia complicating pregnancy, unspecified trimester: Secondary | ICD-10-CM

## 2016-07-04 DIAGNOSIS — O09523 Supervision of elderly multigravida, third trimester: Secondary | ICD-10-CM | POA: Diagnosis not present

## 2016-07-07 ENCOUNTER — Ambulatory Visit (INDEPENDENT_AMBULATORY_CARE_PROVIDER_SITE_OTHER): Payer: Commercial Managed Care - HMO | Admitting: Obstetrics & Gynecology

## 2016-07-07 ENCOUNTER — Ambulatory Visit: Payer: Commercial Managed Care - HMO

## 2016-07-07 VITALS — BP 126/73 | HR 102 | Wt 207.0 lb

## 2016-07-07 DIAGNOSIS — Z3483 Encounter for supervision of other normal pregnancy, third trimester: Secondary | ICD-10-CM

## 2016-07-07 DIAGNOSIS — Z113 Encounter for screening for infections with a predominantly sexual mode of transmission: Secondary | ICD-10-CM

## 2016-07-07 DIAGNOSIS — O09523 Supervision of elderly multigravida, third trimester: Secondary | ICD-10-CM

## 2016-07-07 LAB — OB RESULTS CONSOLE GBS: GBS: NEGATIVE

## 2016-07-07 NOTE — Progress Notes (Signed)
   PRENATAL VISIT NOTE  Subjective:  Heidi Woods is a 35 y.o. [id number] at 2779w4d being seen today for ongoing prenatal care.  She is currently monitored for the following issues for this low-risk pregnancy and has Supervision of normal pregnancy; Hemoglobin C trait (HCC); Advanced maternal age in multigravida; Anemia affecting pregnancy; Increased nuchal translucency space on fetal ultrasound; Dysplasia of cervix, low grade (CIN 1); Hemoglobin O Arab trait (HCC); and Maternal iron deficiency anemia affecting pregnancy in third trimester, antepartum on her problem list.  Patient reports no complaints.  Contractions: Irregular. Vag. Bleeding: None.  Movement: Present. Denies leaking of fluid.   The following portions of the patient's history were reviewed and updated as appropriate: allergies, current medications, past family history, past medical history, past social history, past surgical history and problem list. Problem list updated.  Objective:   Vitals:   07/07/16 1631  BP: 126/73  Pulse: (!) 102  Weight: 207 lb (93.9 kg)    Fetal Status: Fetal Heart Rate (bpm): 150 Fundal Height: 37 cm Movement: Present  Presentation: Vertex  General:  Alert, oriented and cooperative. Patient is in no acute distress.  Skin: Skin is warm and dry. No rash noted.   Cardiovascular: Normal heart rate noted  Respiratory: Normal respiratory effort, no problems with respiration noted  Abdomen: Soft, gravid, appropriate for gestational age. Pain/Pressure: Present     Pelvic:  Cervical exam performed Dilation: Closed Effacement (%): Thick Station: Ballotable  Extremities: Normal range of motion.  Edema: Trace  Mental Status: Normal mood and affect. Normal behavior. Normal judgment and thought content.   Assessment and Plan:  Pregnancy: [id number] at 4579w4d  1. Encounter for supervision of other normal pregnancy in third trimester 2. Elderly multigravida in third trimester Pelvic cultures today.  - Culture,  beta strep (group b only) - GC/Chlamydia probe amp ([place] Labor symptoms and general obstetric precautions including but not limited to vaginal bleeding, contractions, leaking of fluid and fetal movement were reviewed in detail with the patient. Please refer to After Visit Summary for other counseling recommendations.  Return in about 1 week (around 07/14/2016).  Tereso NewcomerUgonna A Corneshia Hines, MD

## 2016-07-07 NOTE — Patient Instructions (Signed)
Return to clinic for any scheduled appointments or obstetric concerns, or go to MAU for evaluation  

## 2016-07-08 ENCOUNTER — Inpatient Hospital Stay: Payer: Commercial Managed Care - HMO

## 2016-07-08 DIAGNOSIS — O99019 Anemia complicating pregnancy, unspecified trimester: Secondary | ICD-10-CM

## 2016-07-08 DIAGNOSIS — O99013 Anemia complicating pregnancy, third trimester: Secondary | ICD-10-CM | POA: Diagnosis not present

## 2016-07-08 MED ORDER — SODIUM CHLORIDE 0.9 % IV SOLN
510.0000 mg | Freq: Once | INTRAVENOUS | Status: AC
  Administered 2016-07-08: 510 mg via INTRAVENOUS
  Filled 2016-07-08: qty 17

## 2016-07-08 MED ORDER — SODIUM CHLORIDE 0.9 % IV SOLN
Freq: Once | INTRAVENOUS | Status: AC
  Administered 2016-07-08: 15:00:00 via INTRAVENOUS
  Filled 2016-07-08: qty 1000

## 2016-07-09 LAB — CULTURE, BETA STREP (GROUP B ONLY)

## 2016-07-09 LAB — GC/CHLAMYDIA PROBE AMP (~~LOC~~) NOT AT ARMC
CHLAMYDIA, DNA PROBE: NEGATIVE
NEISSERIA GONORRHEA: NEGATIVE

## 2016-07-10 ENCOUNTER — Encounter (HOSPITAL_COMMUNITY): Payer: Self-pay

## 2016-07-10 ENCOUNTER — Inpatient Hospital Stay (HOSPITAL_COMMUNITY)
Admission: AD | Admit: 2016-07-10 | Discharge: 2016-07-10 | Disposition: A | Discharge status: Home / Self Care | Payer: Commercial Managed Care - HMO | Source: Ambulatory Visit | Attending: Obstetrics & Gynecology | Admitting: Obstetrics & Gynecology

## 2016-07-10 DIAGNOSIS — Z3483 Encounter for supervision of other normal pregnancy, third trimester: Secondary | ICD-10-CM

## 2016-07-10 DIAGNOSIS — Z3A37 37 weeks gestation of pregnancy: Secondary | ICD-10-CM | POA: Insufficient documentation

## 2016-07-10 DIAGNOSIS — O99019 Anemia complicating pregnancy, unspecified trimester: Secondary | ICD-10-CM

## 2016-07-10 DIAGNOSIS — O283 Abnormal ultrasonic finding on antenatal screening of mother: Secondary | ICD-10-CM

## 2016-07-10 DIAGNOSIS — D582 Other hemoglobinopathies: Secondary | ICD-10-CM

## 2016-07-10 DIAGNOSIS — O471 False labor at or after 37 completed weeks of gestation: Secondary | ICD-10-CM | POA: Insufficient documentation

## 2016-07-10 NOTE — MAU Note (Signed)
Pt reports having back pain, and pelvic pressure a dn regular ctx that started about 30 min ago. Denies any vag bleeding or leaking. Not dilated on office visit this week. Good fetal movement reported.

## 2016-07-15 ENCOUNTER — Ambulatory Visit (INDEPENDENT_AMBULATORY_CARE_PROVIDER_SITE_OTHER): Payer: Commercial Managed Care - HMO | Admitting: Obstetrics & Gynecology

## 2016-07-15 VITALS — BP 129/71 | HR 90 | Wt 201.0 lb

## 2016-07-15 DIAGNOSIS — Z3483 Encounter for supervision of other normal pregnancy, third trimester: Secondary | ICD-10-CM

## 2016-07-15 NOTE — Patient Instructions (Signed)
Return to clinic for any scheduled appointments or obstetric concerns, or go to MAU for evaluation  

## 2016-07-15 NOTE — Progress Notes (Signed)
   PRENATAL VISIT NOTE  Subjective:  Heidi Woods is a 35 y.o. [id number] at 4950w5d being seen today for ongoing prenatal care.  She is currently monitored for the following issues for this low-risk pregnancy and has Supervision of normal pregnancy; Hemoglobin C trait (HCC); Advanced maternal age in multigravida; Anemia affecting pregnancy; Increased nuchal translucency space on fetal ultrasound; Dysplasia of cervix, low grade (CIN 1); Hemoglobin O Arab trait (HCC); and Maternal iron deficiency anemia affecting pregnancy in third trimester, antepartum on her problem list.  Patient reports no complaints.  Contractions: Irregular. Vag. Bleeding: None.  Movement: Present. Denies leaking of fluid.   The following portions of the patient's history were reviewed and updated as appropriate: allergies, current medications, past family history, past medical history, past social history, past surgical history and problem list. Problem list updated.  Objective:   Vitals:   07/15/16 1420  BP: 129/71  Pulse: 90  Weight: 201 lb (91.2 kg)    Fetal Status: Fetal Heart Rate (bpm): 147 Fundal Height: 36 cm Movement: Present     General:  Alert, oriented and cooperative. Patient is in no acute distress.  Skin: Skin is warm and dry. No rash noted.   Cardiovascular: Normal heart rate noted  Respiratory: Normal respiratory effort, no problems with respiration noted  Abdomen: Soft, gravid, appropriate for gestational age. Pain/Pressure: Present     Pelvic:  Cervical exam deferred        Extremities: Normal range of motion.  Edema: Trace  Mental Status: Normal mood and affect. Normal behavior. Normal judgment and thought content.   Assessment and Plan:  Pregnancy: [id number] at 2150w5d  1. Encounter for supervision of other normal pregnancy in third trimester Normal pelvic cultures; results reviewed with patient. Term labor symptoms and general obstetric precautions including but not limited to vaginal bleeding,  contractions, leaking of fluid and fetal movement were reviewed in detail with the patient. Please refer to After Visit Summary for other counseling recommendations.  Return in about 1 week (around 07/22/2016) for OB Visit.  Tereso NewcomerUgonna A Charnise Lovan, MD

## 2016-07-21 ENCOUNTER — Encounter (HOSPITAL_COMMUNITY): Payer: Self-pay | Admitting: *Deleted

## 2016-07-21 ENCOUNTER — Encounter: Payer: Self-pay | Admitting: Family Medicine

## 2016-07-21 ENCOUNTER — Ambulatory Visit (INDEPENDENT_AMBULATORY_CARE_PROVIDER_SITE_OTHER): Payer: Commercial Managed Care - HMO | Admitting: Family Medicine

## 2016-07-21 ENCOUNTER — Inpatient Hospital Stay (HOSPITAL_COMMUNITY)
Admission: AD | Admit: 2016-07-21 | Discharge: 2016-07-21 | Disposition: A | Discharge status: Home / Self Care | Payer: Commercial Managed Care - HMO | Source: Ambulatory Visit | Attending: Family Medicine | Admitting: Family Medicine

## 2016-07-21 VITALS — BP 114/76 | HR 81

## 2016-07-21 DIAGNOSIS — O283 Abnormal ultrasonic finding on antenatal screening of mother: Secondary | ICD-10-CM

## 2016-07-21 DIAGNOSIS — Z3483 Encounter for supervision of other normal pregnancy, third trimester: Secondary | ICD-10-CM

## 2016-07-21 DIAGNOSIS — O479 False labor, unspecified: Secondary | ICD-10-CM

## 2016-07-21 DIAGNOSIS — O99019 Anemia complicating pregnancy, unspecified trimester: Secondary | ICD-10-CM

## 2016-07-21 DIAGNOSIS — O471 False labor at or after 37 completed weeks of gestation: Secondary | ICD-10-CM

## 2016-07-21 DIAGNOSIS — D582 Other hemoglobinopathies: Secondary | ICD-10-CM

## 2016-07-21 NOTE — MAU Note (Signed)
Pt reports contractions and ? Leaking fluid.  

## 2016-07-21 NOTE — Progress Notes (Signed)
   PRENATAL VISIT NOTE  Subjective:  Heidi Woods is a 35 y.o. [id number] at 7333w4d being seen today for ongoing prenatal care.  She is currently monitored for the following issues for this low-risk pregnancy and has Supervision of normal pregnancy; Hemoglobin C trait (HCC); Advanced maternal age in multigravida; Anemia affecting pregnancy; Increased nuchal translucency space on fetal ultrasound; Dysplasia of cervix, low grade (CIN 1); Hemoglobin O Arab trait (HCC); and Maternal iron deficiency anemia affecting pregnancy in third trimester, antepartum on her problem list.  Patient reports contractions since earlier today.  Contractions: Regular. Vag. Bleeding: Small.  Movement: Present. Denies leaking of fluid.   The following portions of the patient's history were reviewed and updated as appropriate: allergies, current medications, past family history, past medical history, past social history, past surgical history and problem list. Problem list updated.  Objective:   Vitals:   07/21/16 1549  BP: 114/76  Pulse: 81    Fetal Status: Fetal Heart Rate (bpm): 140   Movement: Present  Presentation: Vertex  General:  Alert, oriented and cooperative. Patient is in no acute distress.  Skin: Skin is warm and dry. No rash noted.   Cardiovascular: Normal heart rate noted  Respiratory: Normal respiratory effort, no problems with respiration noted  Abdomen: Soft, gravid, appropriate for gestational age. Pain/Pressure: Present     Pelvic:  Cervical exam performed Dilation: 2.5 Effacement (%): 70 Station: -1  Extremities: Normal range of motion.     Mental Status: Normal mood and affect. Normal behavior. Normal judgment and thought content.  NST reviewed and reactive.  Assessment and Plan:  Pregnancy: [id number] at 8033w4d  1. Encounter for supervision of other normal pregnancy in third trimester Continue routine prenatal care.   2. Early labor, antepartum Little bit of cervical change while here in  the office. She is having a lot of bloody show. FHR is reassuring. Advised to head to the hospital in a bit.  Term labor symptoms and general obstetric precautions including but not limited to vaginal bleeding, contractions, leaking of fluid and fetal movement were reviewed in detail with the patient. Please refer to After Visit Summary for other counseling recommendations.  Return in 1 week (on 07/28/2016).   Reva Boresanya S Lailoni Baquera, MD

## 2016-07-21 NOTE — Discharge Instructions (Signed)
Braxton Hicks Contractions °Contractions of the uterus can occur throughout pregnancy. Contractions are not always a sign that you are in labor.  °WHAT ARE BRAXTON HICKS CONTRACTIONS?  °Contractions that occur before labor are called Braxton Hicks contractions, or false labor. Toward the end of pregnancy (32-34 weeks), these contractions can develop more often and may become more forceful. This is not true labor because these contractions do not result in opening (dilatation) and thinning of the cervix. They are sometimes difficult to tell apart from true labor because these contractions can be forceful and people have different pain tolerances. You should not feel embarrassed if you go to the hospital with false labor. Sometimes, the only way to tell if you are in true labor is for your health care provider to look for changes in the cervix. °If there are no prenatal problems or other health problems associated with the pregnancy, it is completely safe to be sent home with false labor and await the onset of true labor. °HOW CAN YOU TELL THE DIFFERENCE BETWEEN TRUE AND FALSE LABOR? °False Labor °· The contractions of false labor are usually shorter and not as hard as those of true labor.   °· The contractions are usually irregular.   °· The contractions are often felt in the front of the lower abdomen and in the groin.   °· The contractions may go away when you walk around or change positions while lying down.   °· The contractions get weaker and are shorter lasting as time goes on.   °· The contractions do not usually become progressively stronger, regular, and closer together as with true labor.   °True Labor °· Contractions in true labor last 30-70 seconds, become very regular, usually become more intense, and increase in frequency.   °· The contractions do not go away with walking.   °· The discomfort is usually felt in the top of the uterus and spreads to the lower abdomen and low back.   °· True labor can be  determined by your health care provider with an exam. This will show that the cervix is dilating and getting thinner.   °WHAT TO REMEMBER °· Keep up with your usual exercises and follow other instructions given by your health care provider.   °· Take medicines as directed by your health care provider.   °· Keep your regular prenatal appointments.   °· Eat and drink lightly if you think you are going into labor.   °· If Braxton Hicks contractions are making you uncomfortable:   °¨ Change your position from lying down or resting to walking, or from walking to resting.   °¨ Sit and rest in a tub of warm water.   °¨ Drink 2-3 glasses of water. Dehydration may cause these contractions.   °¨ Do slow and deep breathing several times an hour.   °WHEN SHOULD I SEEK IMMEDIATE MEDICAL CARE? °Seek immediate medical care if: °· Your contractions become stronger, more regular, and closer together.   °· You have fluid leaking or gushing from your vagina.   °· You have a fever.   °· You pass blood-tinged mucus.   °· You have vaginal bleeding.   °· You have continuous abdominal pain.   °· You have low back pain that you never had before.   °· You feel your baby's head pushing down and causing pelvic pressure.   °· Your baby is not moving as much as it used to.   °  °This information is not intended to replace advice given to you by your health care provider. Make sure you discuss any questions you have with your health care   provider. °  °Document Released: 09/01/2005 Document Revised: 09/06/2013 Document Reviewed: 06/13/2013 °[employer] Interactive Patient Education ©2016 [employer] ° °

## 2016-07-21 NOTE — Patient Instructions (Signed)
Third Trimester of Pregnancy The third trimester is from week 29 through week 42, months 7 through 9. The third trimester is a time when the fetus is growing rapidly. At the end of the ninth month, the fetus is about 20 inches in length and weighs 6-10 pounds.  BODY CHANGES Your body goes through many changes during pregnancy. The changes vary from woman to woman.   Your weight will continue to increase. You can expect to gain 25-35 pounds (11-16 kg) by the end of the pregnancy.  You may begin to get stretch marks on your hips, abdomen, and breasts.  You may urinate more often because the fetus is moving lower into your pelvis and pressing on your bladder.  You may develop or continue to have heartburn as a result of your pregnancy.  You may develop constipation because certain hormones are causing the muscles that push waste through your intestines to slow down.  You may develop hemorrhoids or swollen, bulging veins (varicose veins).  You may have pelvic pain because of the weight gain and pregnancy hormones relaxing your joints between the bones in your pelvis. Backaches may result from overexertion of the muscles supporting your posture.  You may have changes in your hair. These can include thickening of your hair, rapid growth, and changes in texture. Some women also have hair loss during or after pregnancy, or hair that feels dry or thin. Your hair will most likely return to normal after your baby is born.  Your breasts will continue to grow and be tender. A yellow discharge may leak from your breasts called colostrum.  Your belly button may stick out.  You may feel short of breath because of your expanding uterus.  You may notice the fetus "dropping," or moving lower in your abdomen.  You may have a bloody mucus discharge. This usually occurs a few days to a week before labor begins.  Your cervix becomes thin and soft (effaced) near your due date. WHAT TO EXPECT AT YOUR  PRENATAL EXAMS  You will have prenatal exams every 2 weeks until week 36. Then, you will have weekly prenatal exams. During a routine prenatal visit:  You will be weighed to make sure you and the fetus are growing normally.  Your blood pressure is taken.  Your abdomen will be measured to track your baby's growth.  The fetal heartbeat will be listened to.  Any test results from the previous visit will be discussed.  You may have a cervical check near your due date to see if you have effaced. At around 36 weeks, your caregiver will check your cervix. At the same time, your caregiver will also perform a test on the secretions of the vaginal tissue. This test is to determine if a type of bacteria, Group B streptococcus, is present. Your caregiver will explain this further. Your caregiver may ask you:  What your birth plan is.  How you are feeling.  If you are feeling the baby move.  If you have had any abnormal symptoms, such as leaking fluid, bleeding, severe headaches, or abdominal cramping.  If you are using any tobacco products, including cigarettes, chewing tobacco, and electronic cigarettes.  If you have any questions. Other tests or screenings that may be performed during your third trimester include:  Blood tests that check for low iron levels (anemia).  Fetal testing to check the health, activity level, and growth of the fetus. Testing is done if you have certain medical conditions or if   there are problems during the pregnancy.  HIV (human immunodeficiency virus) testing. If you are at high risk, you may be screened for HIV during your third trimester of pregnancy. FALSE LABOR You may feel small, irregular contractions that eventually go away. These are called Braxton Hicks contractions, or false labor. Contractions may last for hours, days, or even weeks before true labor sets in. If contractions come at regular intervals, intensify, or become painful, it is best to be seen  by your caregiver.  SIGNS OF LABOR   Menstrual-like cramps.  Contractions that are 5 minutes apart or less.  Contractions that start on the top of the uterus and spread down to the lower abdomen and back.  A sense of increased pelvic pressure or back pain.  A watery or bloody mucus discharge that comes from the vagina. If you have any of these signs before the 37th week of pregnancy, call your caregiver right away. You need to go to the hospital to get checked immediately. HOME CARE INSTRUCTIONS   Avoid all smoking, herbs, alcohol, and unprescribed drugs. These chemicals affect the formation and growth of the baby.  Do not use any tobacco products, including cigarettes, chewing tobacco, and electronic cigarettes. If you need help quitting, ask your health care provider. You may receive counseling support and other resources to help you quit.  Follow your caregiver's instructions regarding medicine use. There are medicines that are either safe or unsafe to take during pregnancy.  Exercise only as directed by your caregiver. Experiencing uterine cramps is a good sign to stop exercising.  Continue to eat regular, healthy meals.  Wear a good support bra for breast tenderness.  Do not use hot tubs, steam rooms, or saunas.  Wear your seat belt at all times when driving.  Avoid raw meat, uncooked cheese, cat litter boxes, and soil used by cats. These carry germs that can cause birth defects in the baby.  Take your prenatal vitamins.  Take 1500-2000 mg of calcium daily starting at the 20th week of pregnancy until you deliver your baby.  Try taking a stool softener (if your caregiver approves) if you develop constipation. Eat more high-fiber foods, such as fresh vegetables or fruit and whole grains. Drink plenty of fluids to keep your urine clear or pale yellow.  Take warm sitz baths to soothe any pain or discomfort caused by hemorrhoids. Use hemorrhoid cream if your caregiver  approves.  If you develop varicose veins, wear support hose. Elevate your feet for 15 minutes, 3-4 times a day. Limit salt in your diet.  Avoid heavy lifting, wear low heal shoes, and practice good posture.  Rest a lot with your legs elevated if you have leg cramps or low back pain.  Visit your dentist if you have not gone during your pregnancy. Use a soft toothbrush to brush your teeth and be gentle when you floss.  A sexual relationship may be continued unless your caregiver directs you otherwise.  Do not travel far distances unless it is absolutely necessary and only with the approval of your caregiver.  Take prenatal classes to understand, practice, and ask questions about the labor and delivery.  Make a trial run to the hospital.  Pack your hospital bag.  Prepare the baby's nursery.  Continue to go to all your prenatal visits as directed by your caregiver. SEEK MEDICAL CARE IF:  You are unsure if you are in labor or if your water has broken.  You have dizziness.  You have   mild pelvic cramps, pelvic pressure, or nagging pain in your abdominal area.  You have persistent nausea, vomiting, or diarrhea.  You have a bad smelling vaginal discharge.  You have pain with urination. SEEK IMMEDIATE MEDICAL CARE IF:   You have a fever.  You are leaking fluid from your vagina.  You have spotting or bleeding from your vagina.  You have severe abdominal cramping or pain.  You have rapid weight loss or gain.  You have shortness of breath with chest pain.  You notice sudden or extreme swelling of your face, hands, ankles, feet, or legs.  You have not felt your baby move in over an hour.  You have severe headaches that do not go away with medicine.  You have vision changes.   This information is not intended to replace advice given to you by your health care provider. Make sure you discuss any questions you have with your health care provider.   Document Released:  08/26/2001 Document Revised: 09/22/2014 Document Reviewed: 11/02/2012 Elsevier Interactive Patient Education 2016 [employer]  Breastfeeding Deciding to breastfeed is one of the best choices you can make for you and your baby. A change in hormones during pregnancy causes your breast tissue to grow and increases the number and size of your milk ducts. These hormones also allow proteins, sugars, and fats from your blood supply to make breast milk in your milk-producing glands. Hormones prevent breast milk from being released before your baby is born as well as prompt milk flow after birth. Once breastfeeding has begun, thoughts of your baby, as well as his or her sucking or crying, can stimulate the release of milk from your milk-producing glands.  BENEFITS OF BREASTFEEDING For Your Baby  Your first milk (colostrum) helps your baby's digestive system function better.  There are antibodies in your milk that help your baby fight off infections.  Your baby has a lower incidence of asthma, allergies, and sudden infant death syndrome.  The nutrients in breast milk are better for your baby than infant formulas and are designed uniquely for your baby's needs.  Breast milk improves your baby's brain development.  Your baby is less likely to develop other conditions, such as childhood obesity, asthma, or type 2 diabetes mellitus. For You  Breastfeeding helps to create a very special [place] between you and your baby.  Breastfeeding is convenient. Breast milk is always available at the correct temperature and costs nothing.  Breastfeeding helps to burn calories and helps you lose the weight gained during pregnancy.  Breastfeeding makes your uterus contract to its prepregnancy size faster and slows bleeding (lochia) after you give birth.   Breastfeeding helps to lower your risk of developing type 2 diabetes mellitus, osteoporosis, and breast or ovarian cancer later in life. SIGNS THAT YOUR BABY IS  HUNGRY Early Signs of Hunger  Increased alertness or activity.  Stretching.  Movement of the head from side to side.  Movement of the head and opening of the mouth when the corner of the mouth or cheek is stroked (rooting).  Increased sucking sounds, smacking lips, cooing, sighing, or squeaking.  Hand-to-mouth movements.  Increased sucking of fingers or hands. Late Signs of Hunger  Fussing.  Intermittent crying. Extreme Signs of Hunger Signs of extreme hunger will require calming and consoling before your baby will be able to breastfeed successfully. Do not wait for the following signs of extreme hunger to occur before you initiate breastfeeding:  Restlessness.  A loud, strong cry.  Screaming.   BREASTFEEDING BASICS Breastfeeding Initiation  Find a comfortable place to sit or lie down, with your neck and back well supported.  Place a pillow or rolled up blanket under your baby to bring him or her to the level of your breast (if you are seated). Nursing pillows are specially designed to help support your arms and your baby while you breastfeed.  Make sure that your baby's abdomen is facing your abdomen.  Gently massage your breast. With your fingertips, massage from your chest wall toward your nipple in a circular motion. This encourages milk flow. You may need to continue this action during the feeding if your milk flows slowly.  Support your breast with 4 fingers underneath and your thumb above your nipple. Make sure your fingers are well away from your nipple and your baby's mouth.  Stroke your baby's lips gently with your finger or nipple.  When your baby's mouth is open wide enough, quickly bring your baby to your breast, placing your entire nipple and as much of the colored area around your nipple (areola) as possible into your baby's mouth.  More areola should be visible above your baby's upper lip than below the lower lip.  Your baby's tongue should be between his  or her lower gum and your breast.  Ensure that your baby's mouth is correctly positioned around your nipple (latched). Your baby's lips should create a seal on your breast and be turned out (everted).  It is common for your baby to suck about 2-3 minutes in order to start the flow of breast milk. Latching Teaching your baby how to latch on to your breast properly is very important. An improper latch can cause nipple pain and decreased milk supply for you and poor weight gain in your baby. Also, if your baby is not latched onto your nipple properly, he or she may swallow some air during feeding. This can make your baby fussy. Burping your baby when you switch breasts during the feeding can help to get rid of the air. However, teaching your baby to latch on properly is still the best way to prevent fussiness from swallowing air while breastfeeding. Signs that your baby has successfully latched on to your nipple:  Silent tugging or silent sucking, without causing you pain.  Swallowing heard between every 3-4 sucks.  Muscle movement above and in front of his or her ears while sucking. Signs that your baby has not successfully latched on to nipple:  Sucking sounds or smacking sounds from your baby while breastfeeding.  Nipple pain. If you think your baby has not latched on correctly, slip your finger into the corner of your baby's mouth to break the suction and place it between your baby's gums. Attempt breastfeeding initiation again. Signs of Successful Breastfeeding Signs from your baby:  A gradual decrease in the number of sucks or complete cessation of sucking.  Falling asleep.  Relaxation of his or her body.  Retention of a small amount of milk in his or her mouth.  Letting go of your breast by himself or herself. Signs from you:  Breasts that have increased in firmness, weight, and size 1-3 hours after feeding.  Breasts that are softer immediately after  breastfeeding.  Increased milk volume, as well as a change in milk consistency and color by the fifth day of breastfeeding.  Nipples that are not sore, cracked, or bleeding. Signs That Your Baby is Getting Enough Milk  Wetting at least 3 diapers in a 24-hour period.   The urine should be clear and pale yellow by age 5 days.  At least 3 stools in a 24-hour period by age 5 days. The stool should be soft and yellow.  At least 3 stools in a 24-hour period by age 7 days. The stool should be seedy and yellow.  No loss of weight greater than 10% of birth weight during the first 3 days of age.  Average weight gain of 4-7 ounces (113-198 g) per week after age 4 days.  Consistent daily weight gain by age 5 days, without weight loss after the age of 2 weeks. After a feeding, your baby may spit up a small amount. This is common. BREASTFEEDING FREQUENCY AND DURATION Frequent feeding will help you make more milk and can prevent sore nipples and breast engorgement. Breastfeed when you feel the need to reduce the fullness of your breasts or when your baby shows signs of hunger. This is called "breastfeeding on demand." Avoid introducing a pacifier to your baby while you are working to establish breastfeeding (the first 4-6 weeks after your baby is born). After this time you may choose to use a pacifier. Research has shown that pacifier use during the first year of a baby's life decreases the risk of sudden infant death syndrome (SIDS). Allow your baby to feed on each breast as long as he or she wants. Breastfeed until your baby is finished feeding. When your baby unlatches or falls asleep while feeding from the first breast, offer the second breast. Because newborns are often sleepy in the first few weeks of life, you may need to awaken your baby to get him or her to feed. Breastfeeding times will vary from baby to baby. However, the following rules can serve as a guide to help you ensure that your baby is  properly fed:  Newborns (babies 4 weeks of age or younger) may breastfeed every 1-3 hours.  Newborns should not go longer than 3 hours during the day or 5 hours during the night without breastfeeding.  You should breastfeed your baby a minimum of 8 times in a 24-hour period until you begin to introduce solid foods to your baby at around 6 months of age. BREAST MILK PUMPING Pumping and storing breast milk allows you to ensure that your baby is exclusively fed your breast milk, even at times when you are unable to breastfeed. This is especially important if you are going back to work while you are still breastfeeding or when you are not able to be present during feedings. Your lactation consultant can give you guidelines on how long it is safe to store breast milk. A breast pump is a machine that allows you to pump milk from your breast into a sterile bottle. The pumped breast milk can then be stored in a refrigerator or freezer. Some breast pumps are operated by hand, while others use electricity. Ask your lactation consultant which type will work best for you. Breast pumps can be purchased, but some hospitals and breastfeeding support groups lease breast pumps on a monthly basis. A lactation consultant can teach you how to hand express breast milk, if you prefer not to use a pump. CARING FOR YOUR BREASTS WHILE YOU BREASTFEED Nipples can become dry, cracked, and sore while breastfeeding. The following recommendations can help keep your breasts moisturized and healthy:  Avoid using soap on your nipples.  Wear a supportive bra. Although not required, special nursing bras and tank tops are designed to allow access to your   breasts for breastfeeding without taking off your entire bra or top. Avoid wearing underwire-style bras or extremely tight bras.  Air dry your nipples for 3-4minutes after each feeding.  Use only cotton bra pads to absorb leaked breast milk. Leaking of breast milk between feedings  is normal.  Use lanolin on your nipples after breastfeeding. Lanolin helps to maintain your skin's normal moisture barrier. If you use pure lanolin, you do not need to wash it off before feeding your baby again. Pure lanolin is not toxic to your baby. You may also hand express a few drops of breast milk and gently massage that milk into your nipples and allow the milk to air dry. In the first few weeks after giving birth, some women experience extremely full breasts (engorgement). Engorgement can make your breasts feel heavy, warm, and tender to the touch. Engorgement peaks within 3-5 days after you give birth. The following recommendations can help ease engorgement:  Completely empty your breasts while breastfeeding or pumping. You may want to start by applying warm, moist heat (in the shower or with warm water-soaked hand towels) just before feeding or pumping. This increases circulation and helps the milk flow. If your baby does not completely empty your breasts while breastfeeding, pump any extra milk after he or she is finished.  Wear a snug bra (nursing or regular) or tank top for 1-2 days to signal your body to slightly decrease milk production.  Apply ice packs to your breasts, unless this is too uncomfortable for you.  Make sure that your baby is latched on and positioned properly while breastfeeding. If engorgement persists after 48 hours of following these recommendations, contact your health care provider or a lactation consultant. OVERALL HEALTH CARE RECOMMENDATIONS WHILE BREASTFEEDING  Eat healthy foods. Alternate between meals and snacks, eating 3 of each per day. Because what you eat affects your breast milk, some of the foods may make your baby more irritable than usual. Avoid eating these foods if you are sure that they are negatively affecting your baby.  Drink milk, fruit juice, and water to satisfy your thirst (about 10 glasses a day).  Rest often, relax, and continue to take  your prenatal vitamins to prevent fatigue, stress, and anemia.  Continue breast self-awareness checks.  Avoid chewing and smoking tobacco. Chemicals from cigarettes that pass into breast milk and exposure to secondhand smoke may harm your baby.  Avoid alcohol and drug use, including marijuana. Some medicines that may be harmful to your baby can pass through breast milk. It is important to ask your health care provider before taking any medicine, including all over-the-counter and prescription medicine as well as vitamin and herbal supplements. It is possible to become pregnant while breastfeeding. If birth control is desired, ask your health care provider about options that will be safe for your baby. SEEK MEDICAL CARE IF:  You feel like you want to stop breastfeeding or have become frustrated with breastfeeding.  You have painful breasts or nipples.  Your nipples are cracked or bleeding.  Your breasts are red, tender, or warm.  You have a swollen area on either breast.  You have a fever or chills.  You have nausea or vomiting.  You have drainage other than breast milk from your nipples.  Your breasts do not become full before feedings by the fifth day after you give birth.  You feel sad and depressed.  Your baby is too sleepy to eat well.  Your baby is having trouble sleeping.     Your baby is wetting less than 3 diapers in a 24-hour period.  Your baby has less than 3 stools in a 24-hour period.  Your baby's skin or the white part of his or her eyes becomes yellow.   Your baby is not gaining weight by 5 days of age. SEEK IMMEDIATE MEDICAL CARE IF:  Your baby is overly tired (lethargic) and does not want to wake up and feed.  Your baby develops an unexplained fever.   This information is not intended to replace advice given to you by your health care provider. Make sure you discuss any questions you have with your health care provider.   Document Released: 09/01/2005  Document Revised: 05/23/2015 Document Reviewed: 02/23/2013 Elsevier Interactive Patient Education 2016 [employer]  

## 2016-07-22 ENCOUNTER — Inpatient Hospital Stay (HOSPITAL_COMMUNITY)
Admission: AD | Admit: 2016-07-22 | Discharge: 2016-07-24 | DRG: 775 | Disposition: A | Discharge status: Home / Self Care | Payer: Commercial Managed Care - HMO | Source: Ambulatory Visit | Attending: Family Medicine | Admitting: Family Medicine

## 2016-07-22 ENCOUNTER — Encounter (HOSPITAL_COMMUNITY): Payer: Self-pay | Admitting: *Deleted

## 2016-07-22 DIAGNOSIS — D509 Iron deficiency anemia, unspecified: Secondary | ICD-10-CM | POA: Diagnosis present

## 2016-07-22 DIAGNOSIS — Z3A38 38 weeks gestation of pregnancy: Secondary | ICD-10-CM | POA: Diagnosis not present

## 2016-07-22 DIAGNOSIS — O283 Abnormal ultrasonic finding on antenatal screening of mother: Secondary | ICD-10-CM

## 2016-07-22 DIAGNOSIS — Z87410 Personal history of cervical dysplasia: Secondary | ICD-10-CM

## 2016-07-22 DIAGNOSIS — D582 Other hemoglobinopathies: Secondary | ICD-10-CM

## 2016-07-22 DIAGNOSIS — Z3403 Encounter for supervision of normal first pregnancy, third trimester: Secondary | ICD-10-CM | POA: Diagnosis present

## 2016-07-22 DIAGNOSIS — O9902 Anemia complicating childbirth: Principal | ICD-10-CM | POA: Diagnosis present

## 2016-07-22 DIAGNOSIS — O99019 Anemia complicating pregnancy, unspecified trimester: Secondary | ICD-10-CM

## 2016-07-22 DIAGNOSIS — Z8249 Family history of ischemic heart disease and other diseases of the circulatory system: Secondary | ICD-10-CM | POA: Diagnosis not present

## 2016-07-22 DIAGNOSIS — Z3483 Encounter for supervision of other normal pregnancy, third trimester: Secondary | ICD-10-CM

## 2016-07-22 LAB — CBC
HEMATOCRIT: 27.8 % — AB (ref 36.0–46.0)
Hemoglobin: 10.1 g/dL — ABNORMAL LOW (ref 12.0–15.0)
MCH: 25.8 pg — AB (ref 26.0–34.0)
MCHC: 36.3 g/dL — ABNORMAL HIGH (ref 30.0–36.0)
MCV: 71.1 fL — AB (ref 78.0–100.0)
PLATELETS: 186 10*3/uL (ref 150–400)
RBC: 3.91 MIL/uL (ref 3.87–5.11)
RDW: 25.8 % — AB (ref 11.5–15.5)
WBC: 14.6 10*3/uL — ABNORMAL HIGH (ref 4.0–10.5)

## 2016-07-22 LAB — TYPE AND SCREEN
ABO/RH(D): O POS
Antibody Screen: NEGATIVE

## 2016-07-22 LAB — RPR: RPR: NONREACTIVE

## 2016-07-22 MED ORDER — PRENATAL MULTIVITAMIN CH
1.0000 | ORAL_TABLET | Freq: Every day | ORAL | Status: DC
  Administered 2016-07-23 – 2016-07-24 (×2): 1 via ORAL
  Filled 2016-07-22 (×2): qty 1

## 2016-07-22 MED ORDER — LIDOCAINE HCL (PF) 1 % IJ SOLN
INTRAMUSCULAR | Status: AC
  Filled 2016-07-22: qty 30

## 2016-07-22 MED ORDER — OXYCODONE-ACETAMINOPHEN 5-325 MG PO TABS: 1.0000 | ORAL_TABLET | ORAL | Status: DC | PRN

## 2016-07-22 MED ORDER — SENNOSIDES-DOCUSATE SODIUM 8.6-50 MG PO TABS
2.0000 | ORAL_TABLET | ORAL | Status: DC
  Administered 2016-07-22 – 2016-07-24 (×2): 2 via ORAL
  Filled 2016-07-22 (×2): qty 2

## 2016-07-22 MED ORDER — FLEET ENEMA 7-19 GM/118ML RE ENEM: 1.0000 | ENEMA | RECTAL | Status: DC | PRN

## 2016-07-22 MED ORDER — ONDANSETRON HCL 4 MG/2ML IJ SOLN: 4.0000 mg | Freq: Four times a day (QID) | INTRAMUSCULAR | Status: DC | PRN

## 2016-07-22 MED ORDER — IBUPROFEN 600 MG PO TABS
600.0000 mg | ORAL_TABLET | Freq: Four times a day (QID) | ORAL | Status: DC
  Administered 2016-07-22 – 2016-07-24 (×9): 600 mg via ORAL
  Filled 2016-07-22 (×9): qty 1

## 2016-07-22 MED ORDER — COCONUT OIL OIL
1.0000 "application " | TOPICAL_OIL | Status: DC | PRN
  Filled 2016-07-22: qty 120

## 2016-07-22 MED ORDER — OXYCODONE-ACETAMINOPHEN 5-325 MG PO TABS: 2.0000 | ORAL_TABLET | ORAL | Status: DC | PRN

## 2016-07-22 MED ORDER — DIBUCAINE 1 % RE OINT
1.0000 "application " | TOPICAL_OINTMENT | RECTAL | Status: DC | PRN
  Filled 2016-07-22: qty 28

## 2016-07-22 MED ORDER — OXYTOCIN 40 UNITS IN LACTATED RINGERS INFUSION - SIMPLE MED
INTRAVENOUS | Status: AC
  Administered 2016-07-22: 39.96 [IU]/h via INTRAVENOUS
  Filled 2016-07-22: qty 1000

## 2016-07-22 MED ORDER — ACETAMINOPHEN 325 MG PO TABS: 650.0000 mg | ORAL_TABLET | ORAL | Status: DC | PRN

## 2016-07-22 MED ORDER — FERROUS SULFATE 325 (65 FE) MG PO TABS
325.0000 mg | ORAL_TABLET | Freq: Two times a day (BID) | ORAL | Status: DC
  Administered 2016-07-22 – 2016-07-24 (×4): 325 mg via ORAL
  Filled 2016-07-22 (×4): qty 1

## 2016-07-22 MED ORDER — SOD CITRATE-CITRIC ACID 500-334 MG/5ML PO SOLN: 30.0000 mL | ORAL | Status: DC | PRN

## 2016-07-22 MED ORDER — DIPHENHYDRAMINE HCL 25 MG PO CAPS
25.0000 mg | ORAL_CAPSULE | Freq: Four times a day (QID) | ORAL | Status: DC | PRN
Start: 2016-07-22 — End: 2016-07-24

## 2016-07-22 MED ORDER — BENZOCAINE-MENTHOL 20-0.5 % EX AERO
1.0000 "application " | INHALATION_SPRAY | CUTANEOUS | Status: DC | PRN
  Filled 2016-07-22: qty 56

## 2016-07-22 MED ORDER — SIMETHICONE 80 MG PO CHEW: 80.0000 mg | CHEWABLE_TABLET | ORAL | Status: DC | PRN

## 2016-07-22 MED ORDER — LACTATED RINGERS IV SOLN: 500.0000 mL | INTRAVENOUS | Status: DC | PRN

## 2016-07-22 MED ORDER — ZOLPIDEM TARTRATE 5 MG PO TABS: 5.0000 mg | ORAL_TABLET | Freq: Every evening | ORAL | Status: DC | PRN

## 2016-07-22 MED ORDER — LIDOCAINE HCL (PF) 1 % IJ SOLN: 30.0000 mL | INTRAMUSCULAR | Status: DC | PRN

## 2016-07-22 MED ORDER — ONDANSETRON HCL 4 MG PO TABS: 4.0000 mg | ORAL_TABLET | ORAL | Status: DC | PRN

## 2016-07-22 MED ORDER — OXYTOCIN BOLUS FROM INFUSION: 500.0000 mL | Freq: Once | INTRAVENOUS | Status: DC

## 2016-07-22 MED ORDER — OXYTOCIN 40 UNITS IN LACTATED RINGERS INFUSION - SIMPLE MED
2.5000 [IU]/h | INTRAVENOUS | Status: DC
  Administered 2016-07-22: 39.96 [IU]/h via INTRAVENOUS

## 2016-07-22 MED ORDER — TETANUS-DIPHTH-ACELL PERTUSSIS 5-2.5-18.5 LF-MCG/0.5 IM SUSP: 0.5000 mL | Freq: Once | INTRAMUSCULAR | Status: DC

## 2016-07-22 MED ORDER — ONDANSETRON HCL 4 MG/2ML IJ SOLN: 4.0000 mg | INTRAMUSCULAR | Status: DC | PRN

## 2016-07-22 MED ORDER — LACTATED RINGERS IV SOLN
INTRAVENOUS | Status: DC
  Administered 2016-07-22: 09:00:00 via INTRAVENOUS

## 2016-07-22 MED ORDER — WITCH HAZEL-GLYCERIN EX PADS: 1.0000 "application " | MEDICATED_PAD | CUTANEOUS | Status: DC | PRN

## 2016-07-22 MED ORDER — FENTANYL CITRATE (PF) 100 MCG/2ML IJ SOLN
100.0000 ug | Freq: Once | INTRAMUSCULAR | Status: DC
  Filled 2016-07-22: qty 2

## 2016-07-22 NOTE — [facility] Note (Signed)
Attempted SVE, unable to feel entire cervix, moderate amount of blood noted when removed hand.  Second RN asked to examine pt., more bleeding noted.  Second exam deferred.  Dr. Genevie AnnSchenk notified, asked that provider come check pt.

## 2016-07-22 NOTE — [facility] Note (Signed)
Pt was seen in [facility] yesterday, DC'd home.  Contractions have become more intense, denies bleeding or LOF.

## 2016-07-22 NOTE — H&P (Signed)
LABOR AND DELIVERY ADMISSION HISTORY AND PHYSICAL NOTE  Heidi Woods is a 35 y.o. female [id number] with IUP at 6249w5d by LMP and 7 week US presenting for SOL.   Denies problems with pregnancy so far except need for iron infusions due to anemia. Very uncomfortable with contractions, progressing quickly. Bloody show. She is unsure if membranes ruptured. She reports positive fetal movement.  Prenatal History/Complications:  Past Medical History: Past Medical History:  Diagnosis Date  . Anemia   . 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 Heterozygotes are clinically asymptomatic with no hematologic abnormalities, while those homozygous for HbO-Arab may have a mild, asymptomatic, compensated hemolytic anemia.  Hemoglobin O-Arab is of clinical   . LGSIL (low grade squamous intraepithelial lesion) on Pap smear 04/05/2013   Also had abnormal pap smear at age 53-22, had normal colpo and paps until 2014    Past Surgical History: Past Surgical History:  Procedure Laterality Date  . CHOLECYSTECTOMY  2012  . INDUCED ABORTION     x 7  . umbbilical hernia repair      Obstetrical History: OB History    Gravida Para Term Preterm AB Living   13 3 3  0 9 3   SAB TAB Ectopic Multiple Live Births   2 7 0 0 3      Social History: Social History   Social History  . Marital status: Single    Spouse name: N/A  . Number of children: N/A  . Years of education: N/A   Social History Main Topics  . Smoking status: Never Smoker  . Smokeless tobacco: Never Used  . Alcohol use No  . Drug use: No  . Sexual activity: Yes    Partners: Male    Birth control/ protection: None   Other Topics Concern  . None    Social History Narrative  . None    Family History: Family History  Problem Relation Age of Onset  . Sickle cell anemia Father   . Cancer Father     prostate  . Cancer Maternal Grandfather     prostate  . Hypertension Maternal Grandfather   . Cancer Paternal Grandfather     throat, lung  . Hypertension Maternal Grandmother   . Cancer Maternal Grandmother     colon    Allergies: No Known Allergies  Prescriptions Prior to Admission  Medication Sig Dispense Refill Last Dose  . ferrous sulfate (FERROUSUL) 325 (65 FE) MG tablet Take 1 tablet (325 mg total) by mouth 2 (two) times daily with a meal. 60 tablet 2 07/21/2016 at Unknown time  . Prenatal MV-Min-FA-Omega-3 (PRENATAL GUMMIES/DHA & FA) 0.4-32.5 MG CHEW Chew 1 each by mouth 2 (two) times daily.   07/21/2016 at Unknown time     Review of Systems   All systems reviewed and negative except as stated in HPI  Blood pressure 133/67, pulse (!) 102, temperature 97.7 F (36.5 C), temperature source Oral, resp. rate 22, last menstrual period 10/25/2015. General appearance: alert, cooperative and mild distress Lungs: no respiratory distress Heart: regular rate Abdomen: soft, non-tender Extremities: No calf swelling or tenderness Presentation: cephalic by nurse exam Fetal monitoring: baseline 140, moderate variability, no accelerations noted, variable decelerations Uterine activity: every  2-3 minutes Dilation: 10 Effacement (%): 100 Station: -1 Exam by:: Ferne CoeS Earl RNC   Prenatal labs: ABO, Rh: O/POS/-- (04/03 1609) Antibody: NEG (04/03 1609) Rubella: immune RPR: NON REAC (08/25 0950)  HBsAg: NEGATIVE (04/03 1609)  HIV: REACTIVE (08/25 0950)  GBS: Negative (10/23 0000)   Prenatal Transfer Tool  Maternal Diabetes: No Genetic Screening: Abnormal:  Results: Other: Increased NT and hypoplastic NB, NIPS normal Maternal Ultrasounds/Referrals: Abnormal:  Findings:   Other:fetal AV canal defect Fetal Ultrasounds or other  Referrals:  Referred to Materal Fetal Medicine  Maternal Substance Abuse:  No Significant Maternal Medications:  Meds include: Other: Iron supplements Significant Maternal Lab Results: None  Results for orders placed or performed during the hospital encounter of 07/22/16 (from the past 24 hour(s))  CBC   Collection Time: 07/22/16  8:30 AM  Result Value Ref Range   WBC 14.6 (H) 4.0 - 10.5 K/uL   RBC 3.91 3.87 - 5.11 MIL/uL   Hemoglobin 10.1 (L) 12.0 - 15.0 g/dL   HCT 25.327.8 (L) 66.436.0 - 40.346.0 %   MCV 71.1 (L) 78.0 - 100.0 fL   MCH 25.8 (L) 26.0 - 34.0 pg   MCHC 36.3 (H) 30.0 - 36.0 g/dL   RDW 47.425.8 (H) 25.911.5 - 56.315.5 %   Platelets 186 150 - 400 K/uL    Patient Active Problem List   Diagnosis Date Noted  . Normal labor 07/22/2016  . Maternal iron deficiency anemia affecting pregnancy in third trimester, antepartum 06/15/2016  . 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 Increased nuchal translucency space on fetal ultrasound 01/28/2016  . Dysplasia of cervix, low grade (CIN 1) 01/28/2016  . Anemia affecting pregnancy 12/18/2015  . Advanced maternal age in multigravida 12/17/2015  . 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 Supervision of normal pregnancy 04/05/2013    Assessment: Heidi Woods is a 35 y.o. [id number] at 3053w5d here for SOL  #Labor: SOL progressing naturally anticipate SVD #Pain: Desired Epidural but presenting in advanced labor #FWB: Category 1 tracing #ID:  GBS negative #MOF: bottle #MOC:undecided, depo or BTS? #Circ:  Yes, inpt  Tillman SersAngela C Riccio, DO PGY-1 11/7/20179:17 AM   OB FELLOW HISTORY AND PHYSICAL ATTESTATION  I have seen and examined this patient; I agree with above documentation in the resident's note.    Heidi Woods 07/22/2016, 1:01 PM

## 2016-07-22 NOTE — Lactation Note (Signed)
This note was copied from a baby's chart. Lactation Consultation Note  Patient Name: Boy Storm Frisklexis Portocarrero WNUUV'OToday's Date: 07/22/2016   NICU baby 6 hours old. Mom's feeding choice is formula, confirmed by patient's bedside RN, Pierina who states that mom is asking for cabbage leaves to "dry up" her milk.   Maternal Data    Feeding Feeding Type: Formula Nipple Type: Slow - flow Length of feed: 10 min  LATCH Score/Interventions                      Lactation Tools Discussed/Used     Consult Status      Sherlyn HayJennifer D Karmen Altamirano 07/22/2016, 3:36 PM

## 2016-07-22 NOTE — Progress Notes (Signed)
Off unit- NICU

## 2016-07-23 NOTE — Progress Notes (Signed)
Patient ID: Heidi FriskAlexis Woods, female   DOB: 1981/03/16, 35 y.o.   MRN: [id number]  POSTPARTUM PROGRESS NOTE  Post Partum Day #1 Subjective:  Heidi Frisklexis Splawn is a 35 y.o. [id number] 5117w5d s/p NSVD.  No acute events overnight.  Pt denies problems with ambulating, voiding or po intake.  She denies nausea or vomiting.  Pain is well controlled.  She has had flatus. She has not had bowel movement.  Lochia Small.   Objective: Blood pressure (!) 112/58, pulse 85, temperature 98.9 F (37.2 C), temperature source Oral, resp. rate 16, last menstrual period 10/25/2015, SpO2 99 %, unknown if currently breastfeeding.  Physical Exam:  General: alert, cooperative and no distress Lochia:normal flow Chest: CTAB Heart: RRR no m/r/g Abdomen: +BS, soft, nontender Uterine Fundus: firm, below umbilicus DVT Evaluation: No calf swelling or tenderness Extremities: Trace edema   Recent Labs  07/22/16 0830  HGB 10.1*  HCT 27.8*    Assessment/Plan:  ASSESSMENT: Heidi Frisklexis Baston is a 35 y.o. [id number] 5817w5d s/p NSVD.  Plan for discharge tomorrow, baby in NICU Bottle feeding Contraception: Depo    LOS: 1 day   Cleda ClarksElizabeth W Mumaw, DO OB Fellow [facility], [facility]  07/23/2016, 7:40 AM

## 2016-07-23 NOTE — Progress Notes (Signed)
POSTPARTUM PROGRESS NOTE  Post Partum Day 1 Subjective:  Heidi Woods is a 35 y.o. [id number] 6616w5d s/p SVD.  No acute events overnight.  Pt denies problems with ambulating, voiding or po intake.  She denies nausea or vomiting.  Pain is well controlled - 0/10 subjectively. She has had flatus. She has not had bowel movement.  Lochia Minimal.   Objective: Blood pressure (!) 112/58, pulse 85, temperature 98.9 F (37.2 C), temperature source Oral, resp. rate 16, last menstrual period 10/25/2015, SpO2 99 %. Patient currently bottle-feeding newborn boy.   Physical Exam:  General: alert, cooperative and no distress Lochia:normal flow Chest: no respiratory distress Heart:regular rate, distal pulses intact Abdomen: soft, nontender,  Uterine Fundus: firm, appropriately tender DVT Evaluation: No calf swelling or tenderness Extremities: No lower extremity edema   Recent Labs  07/22/16 0830  HGB 10.1*  HCT 27.8*  WBC  14.6  PLT 186    Assessment/Plan:  ASSESSMENT: Heidi Woods is a 35 y.o. [id number] 3316w5d s/p SVD. She is well-appearing and in no acute distress.  No pain.  No lochia, discharge, or overt bleeding.  VSS and hemodynamically stable.   Patient electing to stay remain in hospital for 1 more day due to baby currently being in NICU.  Will discharge tomorrow.   LOS: 1 day   Cloyd StagersMark S Phillips, PA-S 07/23/2016, 7:29 AM   OB FELLOW MEDICAL STUDENT NOTE ATTESTATION  I have seen and examined this patient. Note this is a Psychologist, occupationalmedical student note and as such does not necessarily reflect the patient's plan of care. Please see progress note for this date of service.    Jen MowElizabeth Mumaw, DO [place] Fellow 07/23/2016

## 2016-07-24 MED ORDER — IBUPROFEN 600 MG PO TABS: 600.0000 mg | ORAL_TABLET | Freq: Four times a day (QID) | ORAL | 0 refills | Status: DC | PRN

## 2016-07-24 NOTE — Progress Notes (Signed)
Psychosocial assessment completed.  Full documentation to follow. 

## 2016-07-24 NOTE — Discharge Summary (Signed)
OB Discharge Summary     Patient Name: Heidi Woods DOB: 04-16-1981 MRN: [id number]  Date of admission: 07/22/2016 Delivering MD: Shonna ChockWOUK, NOAH BEDFORD   Date of discharge: 07/24/2016  Admitting diagnosis: LABOR Intrauterine pregnancy: 456w5d     Secondary diagnosis:  Active Problems:   Normal labor  Additional problems: anemia in preg     Discharge diagnosis: Term Pregnancy Delivered                                                                                                Post partum procedures:none  Augmentation: none  Complications: None  Hospital course:  Onset of Labor With Vaginal Delivery     35 y.o. yo [id number] at 7256w5d was admitted in Active Labor on 07/22/2016. Patient had an uncomplicated labor course as follows:  Membrane Rupture Time/Date: 12:01 AM ,07/22/2016   Intrapartum Procedures: Episiotomy: None [1]                                         Lacerations:  None [1]  Patient had a delivery of a Viable infant. 07/22/2016  Information for the patient's newborn:  Boyce MediciBond, Boy Hiilei [[id number]][[id number]]  Delivery Method: Vaginal, Spontaneous Delivery (Filed from Delivery Summary)    Pateint had an uncomplicated postpartum course.  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 She is ambulating, tolerating a regular diet, passing flatus, and urinating well. Patient is discharged home in stable condition on 07/24/16.    Physical exam Vitals:   07/22/16 2356 07/23/16 0621 07/23/16 1838 07/24/16 0646  BP: 113/66 (!) 112/58 118/73 133/72  Pulse: 91 85 84 82  Resp: 16 16 16 16   Temp: 97.8 F (36.6 C) 98.9 F (37.2 C) 97.6 F (36.4 C) 98.7 F (37.1 C)  TempSrc:  Oral Oral Oral  SpO2: 99%      General: alert and cooperative Lochia: appropriate Uterine Fundus: firm Incision: N/A DVT Evaluation: No evidence of DVT seen on physical exam. Labs: Lab Results  Component Value Date   WBC 14.6 (H) 07/22/2016   HGB 10.1 (L) 07/22/2016   HCT 27.8 (L)  07/22/2016   MCV 71.1 (L) 07/22/2016   PLT 186 07/22/2016   CMP Latest Ref Rng & Units 04/04/2013  Glucose 65 - 99 mg/dL 308(M104(H)  BUN 7 - 18 mg/dL 11  Creatinine 5.780.60 - 4.691.30 mg/dL 6.290.66  Sodium 528136 - 413145 mmol/L 136  Potassium 3.5 - 5.1 mmol/L 3.3(L)  Chloride 98 - 107 mmol/L 107  CO2 21 - 32 mmol/L 23  Calcium 8.5 - 10.1 mg/dL 8.9    Discharge instruction: per After Visit Summary and "Baby and Me Booklet".  After visit meds:    Medication List    TAKE these medications   ferrous sulfate 325 (65 FE) MG tablet Commonly known as:  FERROUSUL Take 1 tablet (325 mg total) by mouth 2 (two) times daily with a meal.   ibuprofen 600 MG tablet Commonly known as:  ADVIL,MOTRIN Take 1 tablet (600  mg total) by mouth every 6 (six) hours as needed.   PRENATAL GUMMIES/DHA & FA 0.4-32.5 MG Chew Chew 1 each by mouth 2 (two) times daily.       Diet: routine diet  Activity: Advance as tolerated. Pelvic rest for 6 weeks.   Outpatient follow up:6 weeks Follow up Appt:Future Appointments Date Time Provider Department Center  07/28/2016 4:15 PM Allie BossierMyra C Dove, MD [facility]  08/04/2016 4:00 PM Tereso NewcomerUgonna A Anyanwu, MD [facility]  09/10/2016 10:45 AM Catalina AntiguaPeggy Constant, MD [facility]  11/03/2016 1:45 PM [facility] None  11/03/2016 2:00 PM Jeralyn Ruthsimothy J Finnegan, MD [facility] None  11/03/2016 2:30 PM [facility] INFUSION CHAIR 11 [facility] None   Follow up Visit:No Follow-up on file.  Postpartum contraception: Depo Provera  Newborn Data: Live born female  Birth Weight: 6 lb 11.2 oz (3040 g) APGAR: 6, 8  Baby Feeding: Bottle Disposition:NICU (Aperts syndrome)   07/24/2016 SHAW, KIMBERLY, CNM  7:28 AM

## 2016-07-24 NOTE — Discharge Instructions (Signed)
Vaginal Birth After Cesarean Delivery  Vaginal birth after cesarean delivery (VBAC) is giving birth vaginally after previously delivering a baby by a cesarean. In the past, if a woman had a cesarean delivery, all births afterward would be done by cesarean delivery. This is no longer true. It can be safe for the mother to try a vaginal delivery after having a cesarean delivery.   It is important to discuss VBAC with your health care provider early in the pregnancy so you can understand the risks, benefits, and options. It will give you time to decide what is best in your particular case. The final decision about whether to have a VBAC or repeat cesarean delivery should be between you and your health care provider. Any changes in your health or your baby's health during your pregnancy may make it necessary to change your initial decision about VBAC.   WOMEN WHO PLAN TO HAVE A VBAC SHOULD CHECK WITH THEIR HEALTH CARE PROVIDER TO BE SURE THAT:  · The previous cesarean delivery was done with a low transverse uterine cut (incision) (not a vertical classical incision).    · The birth canal is big enough for the baby.    · There were no other operations on the uterus.    · An electronic fetal monitor (EFM) will be on at all times during labor.    · An operating room will be available and ready in case an emergency cesarean delivery is needed.    · A health care provider and surgical nursing staff will be available at all times during labor to be ready to do an emergency delivery cesarean if necessary.    · An anesthesiologist will be present in case an emergency cesarean delivery is needed.    · The nursery is prepared and has adequate personnel and necessary equipment available to care for the baby in case of an emergency cesarean delivery.  BENEFITS OF VBAC  · Shorter stay in the hospital.    · Avoidance of risks associated with cesarean delivery, such as:    Surgical complications, such as opening of the incision or  hernia in the incision.    Injury to other organs.    Fever. This can occur if an infection develops after surgery. It can also occur as a reaction to the medicine given to make you numb during the surgery.  · Less blood loss and need for blood transfusions.  · Lower risk of blood clots and infection.   · Shorter recovery.    · Decreased risk for having to remove the uterus (hysterectomy).    · Decreased risk for the placenta to completely or partially cover the opening of the uterus (placenta previa) with a future pregnancy.    · Decrease risk in future labor and delivery.  RISKS OF A VBAC  · Tearing (rupture) of the uterus. This is occurs in less than 1% of VBACs. The risk of this happening is higher if:    Steps are taken to begin the labor process (induce labor) or stimulate or strengthen contractions (augment labor).      Medicine is used to soften (ripen) the cervix.  · Having to remove the uterus (hysterectomy) if it ruptures.  VBAC SHOULD NOT BE DONE IF:  · The previous cesarean delivery was done with a vertical (classical) or T-shaped incision or you do not know what kind of incision was made.    · You had a ruptured uterus.    · You have had certain types of surgery on your uterus, such as removal of uterine fibroids.   Ask your health care provider about other types of surgeries that prevent you from having a VBAC.  · You have certain medical or childbirth (obstetrical) problems.    · There are problems with the baby.    · You have had two previous cesarean deliveries and no vaginal deliveries.  OTHER FACTS TO KNOW ABOUT VBAC:  · It is safe to have an epidural anesthetic with VBAC.    · It is safe to turn the baby from a breech position (attempt an external cephalic version).    · It is safe to try a VBAC with twins.    · VBAC may not be successful if your baby weights 8.8 lb (4 kg) or more. However, weight predictions are not always accurate and should not be used alone to decide if VBAC is right for  you.  · There is an increased failure rate if the time between the cesarean delivery and VBAC is less than 19 months.    · Your health care provider may advise against a VBAC if you have preeclampsia (high blood pressure, protein in the urine, and swelling of face and extremities).    · VBAC is often successful if you previously gave birth vaginally.    · VBAC is often successful when the labor starts spontaneously before the due date.    · Delivering a baby through a VBAC is similar to having a normal spontaneous vaginal delivery.     This information is not intended to replace advice given to you by your health care provider. Make sure you discuss any questions you have with your health care provider.     Document Released: 02/22/2007 Document Revised: 09/22/2014 Document Reviewed: 03/31/2013  Elsevier Interactive Patient Education ©2016 Elsevier Inc.

## 2016-07-24 NOTE — Progress Notes (Signed)
CSW attempted to meet with parents in MOB's room, but they were not present at times CSW tried.  CSW went to baby's bedside and met with parents briefly to introduce services and offer to meet with parents when they are back in MOB's room.  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  Parents were agreeable to meeting with [place].  CSW asked that bedside RN call CSW when parents return to MOB's room.

## 2016-07-25 NOTE — Progress Notes (Signed)
CLINICAL SOCIAL WORK MATERNAL/CHILD NOTE  Patient Details  Name: Heidi Woods MRN: [id number] Date of Birth: 07/22/2016  Date:  07/25/2016  Clinical Social Worker Initiating Note:  Ebonie Westerlund E. Brigitte Pulse, [place] Date/ Time Initiated:  07/24/16/1650     Child's Name:  Heidi Woods   Legal Guardian:  Other (Comment) (Parents: Heidi Woods and Heidi Woods)   Need for Interpreter:  None   Date of Referral:   (No referral-NICU admission)     Reason for Referral:  Parental Support of Children with Anomalies/Syndromes    Referral Source:      Address:  [address] [place], [place] [address]  Phone number:  [phone]   Household Members:  Significant Other, Minor Children (There are three children living in the home ages 24, 57 and 2)   Natural Supports (not living in the home):  Friends, Immediate Family, Extended Family   Professional Supports: None   Employment:     Type of Work: Both parents work for the [employer]   Education:      Museum/gallery curator Resources:  [employer], [employer]   Other Resources:      Cultural/Religious Considerations Which May Impact Care: None stated.  MOB's facesheet notes religion as Non-Denominational.  Strengths:  Ability to meet basic needs , Compliance with medical plan , Pediatrician chosen , Home prepared for child , Understanding of illness (Parents acknowledge that they are still processing the diagnosis.  Pediatric follow up will be with [facility])   Risk Factors/Current Problems:  None   Cognitive State:  Able to Concentrate , Alert , Insightful , Linear Thinking , Goal Oriented    Mood/Affect:  Sad , Interested , Calm    CSW Assessment: CSW met with parents in MOB's first floor room to offer support and complete assessment due to baby's recent dx of Apert Syndrome.  CSW understands that baby will be transferred to [facility].   FOB was minimally involved in the conversation as he was preparing for MOB's discharge  later today.  Parents appeared solemn and acknowledged that they are still processing the information they have been given today regarding baby's diagnosis of Apert Syndrome.  They were able to talk about it, although felt they couldn't quite put into words how they were feeling.  MOB informed CSW that she "knew nothing prior to delivery."  She states she has had a similar experience with her 35 year old who was diagnosed with Microphthalmia.  CSW inquired about how MOB copes with stress and how she coped with her 35 year old's diagnosis.  She states, "I know I shut down and I don't want to do that this time."  CSW encouraged open communication between her and FOB and encouraged her to consider counseling to help her process her feelings and develop positive coping mechanisms.  MOB states she is interested.  She reports that she works for the [employer] and that they offer an Forensic psychologist.  [place] encouraged her to inquire about this, contact her insurance company for [employer], and provided MOB with a list of counselors in the area who accept her insurance.  CSW encouraged FOB to consider this as well.   CSW informed parents of [facility] to assist their children in coping with baby's diagnosis if they feel their children would benefit from this.  CSW also informed parents of [facility] services in May if they are interested and provided them with contact number.  Parents report  that baby will be admitted to a Pediatric floor and therefore were told that they would be able to stay in the room with him.  They were appreciative of the resource information.   CSW understands that family has already been linked with [employer].    CSW Plan/Description:  No Further Intervention Required/No Barriers to Discharge, Patient/Family Education , Information/Referral to [employer], [place], [place] 07/25/2016, 1:10  PM

## 2016-07-28 ENCOUNTER — Encounter: Payer: Commercial Managed Care - HMO | Admitting: Obstetrics & Gynecology

## 2016-08-04 ENCOUNTER — Encounter: Payer: Commercial Managed Care - HMO | Admitting: Obstetrics & Gynecology

## 2016-09-10 ENCOUNTER — Ambulatory Visit: Payer: Commercial Managed Care - HMO | Admitting: Obstetrics and Gynecology

## 2016-09-12 ENCOUNTER — Ambulatory Visit (INDEPENDENT_AMBULATORY_CARE_PROVIDER_SITE_OTHER): Payer: Commercial Managed Care - HMO | Admitting: Family Medicine

## 2016-09-12 ENCOUNTER — Encounter: Payer: Self-pay | Admitting: Family Medicine

## 2016-09-12 VITALS — BP 138/84 | HR 91 | Resp 18 | Ht 68.5 in | Wt 172.0 lb

## 2016-09-12 DIAGNOSIS — Z01812 Encounter for preprocedural laboratory examination: Secondary | ICD-10-CM

## 2016-09-12 DIAGNOSIS — Z30013 Encounter for initial prescription of injectable contraceptive: Secondary | ICD-10-CM | POA: Diagnosis not present

## 2016-09-12 DIAGNOSIS — Z3202 Encounter for pregnancy test, result negative: Secondary | ICD-10-CM | POA: Diagnosis not present

## 2016-09-12 LAB — POCT URINE PREGNANCY: PREG TEST UR: NEGATIVE

## 2016-09-12 MED ORDER — MEDROXYPROGESTERONE ACETATE 150 MG/ML IM SUSP
150.0000 mg | INTRAMUSCULAR | Status: DC
  Administered 2016-09-12: 150 mg via INTRAMUSCULAR

## 2016-09-12 NOTE — Patient Instructions (Signed)
Laparoscopic Tubal Ligation Introduction Laparoscopic tubal ligation is a procedure to close the fallopian tubes. This is done so that you cannot get pregnant. When the fallopian tubes are closed, the eggs that your ovaries release cannot enter the uterus, and sperm cannot reach the released eggs. A laparoscopic tubal ligation is sometimes called "getting your tubes tied." You should not have this procedure if you want to get pregnant someday or if you are unsure about having more children. Tell a health care provider about:  Any allergies you have.  All medicines you are taking, including vitamins, herbs, eye drops, creams, and over-the-counter medicines.  Any problems you or family members have had with anesthetic medicines.  Any blood disorders you have.  Any surgeries you have had.  Any medical conditions you have.  Whether you are pregnant or may be pregnant.  Any past pregnancies. What are the risks? Generally, this is a safe procedure. However, problems may occur, including:  Infection.  Bleeding.  Injury to surrounding organs.  Side effects from anesthetics.  Failure of the procedure. This procedure can increase your risk of a kind of pregnancy in which a fertilized egg attaches to the outside of the uterus (ectopic pregnancy). What happens before the procedure?  Ask your health care provider about:  Changing or stopping your regular medicines. This is especially important if you are taking diabetes medicines or blood thinners.  Taking medicines such as aspirin and ibuprofen. These medicines can thin your blood. Do not take these medicines before your procedure if your health care provider instructs you not to.  Follow instructions from your health care provider about eating and drinking restrictions.  Plan to have someone take you home after the procedure.  If you go home right after the procedure, plan to have someone with you for 24 hours. What happens during  the procedure?  You will be given one or more of the following:  A medicine to help you relax (sedative).  A medicine to numb the area (local anesthetic).  A medicine to make you fall asleep (general anesthetic).  A medicine that is injected into an area of your body to numb everything below the injection site (regional anesthetic).  An IV tube will be inserted into one of your veins. It will be used to give you medicines and fluids during the procedure.  Your bladder may be emptied with a small tube (catheter).  If you have been given a general anesthetic, a tube will be put down your throat to help you breathe.  Two small cuts (incisions) will be made in your lower abdomen and near your belly button.  Your abdomen will be inflated with a gas. This will let the surgeon see better and will give the surgeon room to work.  A thin, lighted tube (laparoscope) with a camera attached will be inserted into your abdomen through one of the incisions. Small instruments will be inserted through the other incision.  The fallopian tubes will be tied off, burned (cauterized), or blocked with a clip, ring, or clamp. A small portion in the center of each fallopian tube may be removed.  The gas will be released from the abdomen.  The incisions will be closed with stitches (sutures).  A bandage (dressing) will be placed over the incisions. The procedure may vary among health care providers and hospitals. What happens after the procedure?  Your blood pressure, heart rate, breathing rate, and blood oxygen level will be monitored often until the medicines you   were given have worn off.  You will be given medicine to help with pain, nausea, and vomiting as needed. This information is not intended to replace advice given to you by your health care provider. Make sure you discuss any questions you have with your health care provider. Document Released: 12/08/2000 Document Revised: 02/07/2016 Document  Reviewed: 08/12/2015  2017 Elsevier  

## 2016-09-12 NOTE — Progress Notes (Signed)
Post Partum Exam  Heidi Woods is a 35 y.o. [id number] female who presents for a postpartum visit. She is 6 weeks postpartum following a spontaneous vaginal delivery. I have fully reviewed the prenatal and intrapartum course. The delivery was at 38.5 gestational weeks.  Anesthesia: none. Postpartum course has been uncomplicated. Baby's course has been complicated by [facility] and diagnosis of Apert's Syndrome. Baby is feeding by bottle - formula. Bleeding no bleeding. Bowel function is normal. Bladder function is normal. Patient is sexually active. Contraception method is condoms. Postpartum depression screening:neg  The following portions of the patient's history were reviewed and updated as appropriate: allergies, current medications, past family history, past medical history, past social history, past surgical history and problem list.  Review of Systems Pertinent items are noted in HPI.    Objective:    BP 116/78 mmHg  Pulse 78  Resp 16  Ht 5\' 5"  (1.651 m)  Wt 211 lb (95.709 kg)  BMI 35.11 kg/m2  Breastfeeding? Yes  General:  alert, cooperative and appears stated age   Breasts:  inspection negative, no nipple discharge or bleeding, no masses or nodularity palpable  Lungs: clear to auscultation bilaterally  Heart:  regular rate and rhythm, S1, S2 normal, no murmur, click, rub or gallop  Abdomen: soft, non-tender; bowel sounds normal; no masses,  no organomegaly   Vulva:  not evaluated  Vagina: not evaluated  Cervix:  not evaluated  Corpus: not examined  Adnexa:  not evaluated  Rectal Exam: Not performed.        Assessment:   Normal postpartum exam. Pap smear not done at today's visit.   Plan:   1. Contraception: Depo-Provera injections with desired BTL, set up consult visit. patiient has private insurance.  2. Mood is stable-- no concerns currently. Patient is adjusting to having infant with special needs. Caryl NeverJace is doing well but has [facility]. They are planning  hand surgery @6  mon 3. Follow up in: 2 weeks for BTL consult  Future Appointments Date Time Provider Department Center  09/30/2016 1:45 PM Reva Boresanya S Pratt, MD [facility]  11/03/2016 1:45 PM [facility] None  11/03/2016 2:00 PM Jeralyn Ruthsimothy J Finnegan, MD [facility] None  11/03/2016 2:30 PM [facility] INFUSION CHAIR 11 [facility] None

## 2016-09-18 ENCOUNTER — Ambulatory Visit: Payer: Commercial Managed Care - HMO | Admitting: Obstetrics and Gynecology

## 2016-09-30 ENCOUNTER — Institutional Professional Consult (permissible substitution): Payer: Commercial Managed Care - HMO | Admitting: Family Medicine

## 2016-09-30 DIAGNOSIS — Z3040 Encounter for surveillance of contraceptives, unspecified: Secondary | ICD-10-CM

## 2016-10-21 ENCOUNTER — Encounter: Payer: Self-pay | Admitting: Internal Medicine

## 2016-10-21 ENCOUNTER — Ambulatory Visit (INDEPENDENT_AMBULATORY_CARE_PROVIDER_SITE_OTHER): Payer: Commercial Managed Care - HMO | Admitting: Internal Medicine

## 2016-10-21 DIAGNOSIS — D509 Iron deficiency anemia, unspecified: Secondary | ICD-10-CM | POA: Diagnosis not present

## 2016-10-21 NOTE — Assessment & Plan Note (Signed)
She is taking an iron supplement daily She reports she is due for iron infusion She will follow with hematology

## 2016-10-21 NOTE — Progress Notes (Signed)
HPI  Pt presents to the clinic today to establish care and for management of the conditions listed below. She [place] She has seen GYN in this area.  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 She takes an iron supplement daily. She denies constipation. She is following with hematology and is about to get an iron infusion.  Flu: never Tdap: 04/2016 Pap Smear: 12/2015 Dentist: as needed  Past Medical History:  Diagnosis Date  . Anemia   . 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 Heterozygotes are clinically asymptomatic with no hematologic abnormalities, while those homozygous for HbO-Arab may have a mild, asymptomatic, compensated hemolytic anemia.  Hemoglobin O-Arab is of clinical   . LGSIL (low grade squamous intraepithelial lesion) on Pap smear 04/05/2013   Also had abnormal pap smear at age 7-22, had normal colpo and paps until 2014    Current Outpatient Prescriptions  Medication Sig Dispense Refill  . ferrous sulfate (FERROUSUL) 325 (65 FE) MG tablet Take 1 tablet (325 mg total) by mouth 2 (two) times daily with a meal. 60 tablet 2  . ibuprofen (ADVIL,MOTRIN) 600 MG tablet Take 1 tablet (600 mg total) by mouth every 6 (six) hours as needed. 30 tablet 0  . Prenatal MV-Min-FA-Omega-3 (PRENATAL GUMMIES/DHA & FA) 0.4-32.5 MG CHEW Chew 1 each by mouth 2 (two) times daily.     Current Facility-Administered Medications  Medication Dose Route Frequency Provider Last Rate Last Dose  . medroxyPROGESTERone (DEPO-PROVERA) injection 150 mg  150 mg Intramuscular Q90 days Federico FlakeKimberly Niles Newton, MD   150 mg at 09/12/16 1058    No Known Allergies  Family History  Problem  Relation Age of Onset  . Sickle cell anemia Father   . Cancer Father     prostate  . Cancer Maternal Grandfather     prostate  . Hypertension Maternal Grandfather   . Cancer Paternal Grandfather     throat, lung  . Hypertension Maternal Grandmother   . Cancer Maternal Grandmother     colon  . Throat cancer Maternal Uncle     Social History   Social History  . Marital status: Single    Spouse name: N/A  . Number of children: N/A  . Years of education: N/A   Occupational History  . Not on file.   Social History Main Topics  . Smoking status: Never Smoker  . Smokeless tobacco: Never Used  . Alcohol use No  . Drug use: No  . Sexual activity: Yes    Partners: Male    Birth control/ protection: Condom   Other Topics Concern  . Not on file   Social History Narrative  . No narrative on file    ROS:  Constitutional: Denies fever, malaise, fatigue, headache or abrupt weight changes.  HEENT: Denies eye pain, eye redness, ear pain, ringing in the ears, wax buildup, runny nose, nasal congestion, bloody nose, or sore throat. Respiratory: Denies difficulty breathing, shortness of breath, cough or sputum production.   Cardiovascular: Denies chest pain, chest tightness, palpitations or swelling in the hands or feet.  Gastrointestinal: Denies abdominal pain, bloating, constipation, diarrhea or blood in the stool.  GU: Denies frequency, urgency,  pain with urination, blood in urine, odor or discharge. Musculoskeletal: Denies decrease in range of motion, difficulty with gait, muscle pain or joint pain and swelling.  Skin: Denies redness, rashes, lesions or ulcercations.  Neurological: Denies dizziness, difficulty with memory, difficulty with speech or problems with balance and coordination.  Psych: Denies anxiety, depression, SI/HI.  No other specific complaints in a complete review of systems (except as listed in HPI above).  PE:  BP 126/80   Pulse 89   Temp 98.1 F (36.7 C)  (Oral)   Wt 165 lb (74.8 kg)   LMP 10/16/2016   SpO2 98%   BMI 24.72 kg/m   Wt Readings from Last 3 Encounters:  10/21/16 165 lb (74.8 kg)  09/12/16 172 lb (78 kg)  07/21/16 206 lb (93.4 kg)    General: Appears her stated age, well developed, well nourished in NAD. Skin: Dry and intact. Cardiovascular: Normal rate and rhythm. Pulmonary/Chest: Normal effort and positive vesicular breath sounds. No respiratory distress. No wheezes, rales or ronchi noted.  Musculoskeletal: N No difficulty with gait.  Neurological: Alert and oriented.  Psychiatric: Mood and affect normal. Behavior is normal. Judgment and thought content normal.     BMET    Component Value Date/Time   NA 136 04/04/2013 0834   K 3.3 (L) 04/04/2013 0834   CL 107 04/04/2013 0834   CO2 23 04/04/2013 0834   GLUCOSE 104 (H) 04/04/2013 0834   BUN 11 04/04/2013 0834   CREATININE 0.66 04/04/2013 0834   CALCIUM 8.9 04/04/2013 0834   GFRNONAA >60 04/04/2013 0834   GFRAA >60 04/04/2013 0834    Lipid Panel  No results found for: CHOL, TRIG, HDL, CHOLHDL, VLDL, LDLCALC  CBC    Component Value Date/Time   WBC 14.6 (H) 07/22/2016 0830   RBC 3.91 07/22/2016 0830   HGB 10.1 (L) 07/22/2016 0830   HGB 8.5 (L) 04/04/2013 0834   HCT 27.8 (L) 07/22/2016 0830   HCT 18.8 (L) 03/05/2016 1216   PLT 186 07/22/2016 0830   PLT 157 04/04/2013 0834   MCV 71.1 (L) 07/22/2016 0830   MCV 74 (L) 04/04/2013 0834   MCH 25.8 (L) 07/22/2016 0830   MCHC 36.3 (H) 07/22/2016 0830   RDW 25.8 (H) 07/22/2016 0830   RDW 21.1 (H) 04/04/2013 0834   LYMPHSABS 1.9 06/30/2016 1341   MONOABS 0.1 (L) 06/30/2016 1341   EOSABS 0.0 06/30/2016 1341   BASOSABS 0.0 06/30/2016 1341    Hgb A1C No results found for: HGBA1C   Assessment and Plan:  Make an appt in 1 year for your annual exam Nicki Reaper, NP

## 2016-10-21 NOTE — Patient Instructions (Signed)
Anemia, Nonspecific Anemia is a condition in which the concentration of red blood cells or hemoglobin in the blood is below normal. Hemoglobin is a substance in red blood cells that carries oxygen to the tissues of the body. Anemia results in not enough oxygen reaching these tissues. What are the causes? Common causes of anemia include:  Excessive bleeding. Bleeding may be internal or external. This includes excessive bleeding from periods (in women) or from the intestine.  Poor nutrition.  Chronic kidney, thyroid, and liver disease.  Bone marrow disorders that decrease red blood cell production.  Cancer and treatments for cancer.  HIV, AIDS, and their treatments.  Spleen problems that increase red blood cell destruction.  Blood disorders.  Excess destruction of red blood cells due to infection, medicines, and autoimmune disorders. What are the signs or symptoms?  Minor weakness.  Dizziness.  Headache.  Palpitations.  Shortness of breath, especially with exercise.  Paleness.  Cold sensitivity.  Indigestion.  Nausea.  Difficulty sleeping.  Difficulty concentrating. Symptoms may occur suddenly or they may develop slowly. How is this diagnosed? Additional blood tests are often needed. These help your health care provider determine the best treatment. Your health care provider will check your stool for blood and look for other causes of blood loss. How is this treated? Treatment varies depending on the cause of the anemia. Treatment can include:  Supplements of iron, vitamin B12, or folic acid.  Hormone medicines.  A blood transfusion. This may be needed if blood loss is severe.  Hospitalization. This may be needed if there is significant continual blood loss.  Dietary changes.  Spleen removal. Follow these instructions at home: Keep all follow-up appointments. It often takes many weeks to correct anemia, and having your health care provider check on your  condition and your response to treatment is very important. Get help right away if:  You develop extreme weakness, shortness of breath, or chest pain.  You become dizzy or have trouble concentrating.  You develop heavy vaginal bleeding.  You develop a rash.  You have bloody or black, tarry stools.  You faint.  You vomit up blood.  You vomit repeatedly.  You have abdominal pain.  You have a fever or persistent symptoms for more than 2-3 days.  You have a fever and your symptoms suddenly get worse.  You are dehydrated. This information is not intended to replace advice given to you by your health care provider. Make sure you discuss any questions you have with your health care provider. Document Released: 10/09/2004 Document Revised: 02/13/2016 Document Reviewed: 02/25/2013 Elsevier Interactive Patient Education  2017 Elsevier Inc.  

## 2016-11-02 NOTE — Progress Notes (Signed)
[facility]  Telephone:(336) [phone] Fax:(336) [phone]  ID: Heidi FriskAlexis Woods OB: 12/22/1980  MR#: [id number]  AOZ#:[id number]#:[id number]  Patient Care Team: Lorre Munroeegina W Baity, NP as PCP - General (Internal Medicine)  CHIEF COMPLAINT:  Iron deficiency anemia.  INTERVAL HISTORY: Patient returns to clinic today for further evaluation and laboratory work. She had an uncomplicated delivery several months ago. She currently feels well and is asymptomatic. She has no neurologic complaints. She does not complain of weakness or fatigue today. She has a good appetite and is gaining weight appropriately. She denies any chest pain or shortness of breath. She has no nausea, vomiting, constipation, or diarrhea. She has no urinary complaints. Patient feels at her baseline and offers no specific complaints today.  REVIEW OF SYSTEMS:   Review of Systems  Constitutional: Negative for fever, malaise/fatigue and weight loss.  Respiratory: Negative.  Negative for cough.   Cardiovascular: Negative.  Negative for chest pain and leg swelling.  Gastrointestinal: Negative.  Negative for abdominal pain, blood in stool and melena.  Genitourinary: Negative.   Musculoskeletal: Negative.   Neurological: Negative.  Negative for weakness.  Psychiatric/Behavioral: Negative.  The patient is not nervous/anxious.     As per HPI. Otherwise, a complete review of systems is negative.  PAST MEDICAL HISTORY: Past Medical History:  Diagnosis Date  . Anemia   . 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 Heterozygotes are clinically asymptomatic with no hematologic abnormalities, while those homozygous for HbO-Arab may have a mild,  asymptomatic, compensated hemolytic anemia.  Hemoglobin O-Arab is of clinical   . LGSIL (low grade squamous intraepithelial lesion) on Pap smear 04/05/2013   Also had abnormal pap smear at age 36-22, had normal colpo and paps until 2014    PAST SURGICAL HISTORY: Past Surgical History:  Procedure Laterality Date  . CHOLECYSTECTOMY  2012  . INDUCED ABORTION     x 7  . umbbilical hernia repair      FAMILY HISTORY: Family History  Problem Relation Age of Onset  . Sickle cell anemia Father   . Cancer Father     prostate  . Cancer Maternal Grandfather     prostate  . Hypertension Maternal Grandfather   . Cancer Paternal Grandfather     throat, lung  . Hypertension Maternal Grandmother   . Cancer Maternal Grandmother     colon  . Throat cancer Maternal Uncle     ADVANCED DIRECTIVES (Y/N):  N  HEALTH MAINTENANCE: Social History  Substance Use Topics  . Smoking status: Never Smoker  . Smokeless tobacco: Never Used  . Alcohol use No     Colonoscopy:  PAP:  Bone density:  Lipid panel:  No Known Allergies  Current Outpatient Prescriptions  Medication Sig Dispense Refill  . ferrous sulfate (FERROUSUL) 325 (65 FE) MG tablet Take 1 tablet (325 mg total) by mouth 2 (two) times daily with a meal. 60 tablet 2  . ibuprofen (ADVIL,MOTRIN) 600 MG tablet Take 1 tablet (600 mg total) by mouth every 6 (six) hours as needed. 30 tablet 0  . Prenatal MV-Min-FA-Omega-3 (PRENATAL GUMMIES/DHA & FA) 0.4-32.5 MG CHEW Chew 1 each by mouth 2 (two) times daily.     No current facility-administered medications for  this visit.     OBJECTIVE: Vitals:   11/03/16 1417  BP: 114/79  Pulse: 87  Resp: 18  Temp: 97.9 F (36.6 C)     Body mass index is 24.79 kg/m.    ECOG FS:0 - Asymptomatic  General: Well-developed, well-nourished, no acute distress. Eyes: Pink conjunctiva, anicteric sclera. Lungs: Clear to auscultation bilaterally. Heart: Regular rate and rhythm. No rubs, murmurs, or  gallops. Abdomen: Nontender, normoactive bowel sounds. Musculoskeletal: No edema, cyanosis, or clubbing. Neuro: Alert, answering all questions appropriately. Cranial nerves grossly intact. Skin: No rashes or petechiae noted. Psych: Normal affect.  LAB RESULTS:  Lab Results  Component Value Date   NA 136 04/04/2013   K 3.3 (L) 04/04/2013   CL 107 04/04/2013   CO2 23 04/04/2013   GLUCOSE 104 (H) 04/04/2013   BUN 11 04/04/2013   CREATININE 0.66 04/04/2013   CALCIUM 8.9 04/04/2013   GFRNONAA >60 04/04/2013   GFRAA >60 04/04/2013    Lab Results  Component Value Date   WBC 5.7 11/03/2016   NEUTROABS 3.1 11/03/2016   HGB 10.9 (L) 11/03/2016   HCT 30.7 (L) 11/03/2016   MCV 76.2 (L) 11/03/2016   PLT 192 11/03/2016   Lab Results  Component Value Date   IRON 76 11/03/2016   TIBC 203 (L) 11/03/2016   IRONPCTSAT 38 (H) 11/03/2016   Lab Results  Component Value Date   FERRITIN 50 11/03/2016     STUDIES: No results found.  ASSESSMENT: Iron deficiency anemia  PLAN:    1.  Iron deficiency anemia: Patient's hemoglobinIs still decreased, but trending upward since giving birth several months ago. Her iron stores are now within normal limits. Previous he, patient was noted to have a mild B12 deficiency, but the remainder of her laboratory work was either negative or within normal limits. No further intervention is needed. Patient does not require additional follow-up. Please refer her back if there are any questions or concerns.  2. Pregnancy: By report, patient had an uncomplicated delivery several months ago.    Patient expressed understanding and was in agreement with this plan. She also understands that She can call clinic at any time with any questions, concerns, or complaints.    Jeralyn Ruths, MD   11/04/2016 4:57 PM

## 2016-11-03 ENCOUNTER — Inpatient Hospital Stay: Payer: Commercial Managed Care - HMO

## 2016-11-03 ENCOUNTER — Inpatient Hospital Stay: Payer: Commercial Managed Care - HMO | Attending: Oncology | Admitting: Oncology

## 2016-11-03 VITALS — BP 114/79 | HR 87 | Temp 97.9°F | Resp 18 | Wt 165.5 lb

## 2016-11-03 DIAGNOSIS — D509 Iron deficiency anemia, unspecified: Secondary | ICD-10-CM | POA: Diagnosis not present

## 2016-11-03 DIAGNOSIS — E538 Deficiency of other specified B group vitamins: Secondary | ICD-10-CM | POA: Insufficient documentation

## 2016-11-03 DIAGNOSIS — O99013 Anemia complicating pregnancy, third trimester: Principal | ICD-10-CM

## 2016-11-03 DIAGNOSIS — Z79899 Other long term (current) drug therapy: Secondary | ICD-10-CM | POA: Diagnosis not present

## 2016-11-03 DIAGNOSIS — D508 Other iron deficiency anemias: Secondary | ICD-10-CM

## 2016-11-03 LAB — FERRITIN: FERRITIN: 50 ng/mL (ref 11–307)

## 2016-11-03 LAB — IRON AND TIBC
IRON: 76 ug/dL (ref 28–170)
Saturation Ratios: 38 % — ABNORMAL HIGH (ref 10.4–31.8)
TIBC: 203 ug/dL — ABNORMAL LOW (ref 250–450)
UIBC: 127 ug/dL

## 2016-11-03 LAB — CBC WITH DIFFERENTIAL/PLATELET
Basophils Absolute: 0 10*3/uL (ref 0–0.1)
Basophils Relative: 0 %
EOS PCT: 1 %
Eosinophils Absolute: 0 10*3/uL (ref 0–0.7)
HEMATOCRIT: 30.7 % — AB (ref 35.0–47.0)
Hemoglobin: 10.9 g/dL — ABNORMAL LOW (ref 12.0–16.0)
LYMPHS ABS: 2.1 10*3/uL (ref 1.0–3.6)
LYMPHS PCT: 38 %
MCH: 27 pg (ref 26.0–34.0)
MCHC: 35.4 g/dL (ref 32.0–36.0)
MCV: 76.2 fL — AB (ref 80.0–100.0)
MONO ABS: 0.3 10*3/uL (ref 0.2–0.9)
Monocytes Relative: 6 %
Neutro Abs: 3.1 10*3/uL (ref 1.4–6.5)
Neutrophils Relative %: 55 %
PLATELETS: 192 10*3/uL (ref 150–440)
RBC: 4.03 MIL/uL (ref 3.80–5.20)
RDW: 16.3 % — AB (ref 11.5–14.5)
WBC: 5.7 10*3/uL (ref 3.6–11.0)

## 2016-11-03 NOTE — Progress Notes (Signed)
Offers no complaints. Feeling well today. 

## 2016-12-01 ENCOUNTER — Ambulatory Visit (INDEPENDENT_AMBULATORY_CARE_PROVIDER_SITE_OTHER): Payer: Commercial Managed Care - HMO | Admitting: *Deleted

## 2016-12-01 DIAGNOSIS — Z3042 Encounter for surveillance of injectable contraceptive: Secondary | ICD-10-CM

## 2016-12-01 MED ORDER — MEDROXYPROGESTERONE ACETATE 150 MG/ML IM SUSP
150.0000 mg | INTRAMUSCULAR | Status: DC
  Administered 2016-12-01 – 2020-03-26 (×11): 150 mg via INTRAMUSCULAR

## 2016-12-01 MED ORDER — MEDROXYPROGESTERONE ACETATE 150 MG/ML IM SUSP: 150.0000 mg | INTRAMUSCULAR | 3 refills | Status: DC

## 2016-12-01 NOTE — Progress Notes (Signed)
Pt here today for Depo Provera 150mg , last injection given on 09-12-16.  Depo Provera given and prescription called into pharmacy for pt to pick up and bring for next injection.

## 2017-02-16 ENCOUNTER — Ambulatory Visit: Payer: Commercial Managed Care - HMO

## 2017-02-26 ENCOUNTER — Other Ambulatory Visit: Payer: Commercial Managed Care - HMO

## 2017-03-02 ENCOUNTER — Other Ambulatory Visit: Payer: Commercial Managed Care - HMO

## 2017-03-05 ENCOUNTER — Other Ambulatory Visit: Payer: Commercial Managed Care - HMO

## 2017-03-23 ENCOUNTER — Ambulatory Visit (INDEPENDENT_AMBULATORY_CARE_PROVIDER_SITE_OTHER): Payer: 59 | Admitting: *Deleted

## 2017-03-23 DIAGNOSIS — Z3042 Encounter for surveillance of injectable contraceptive: Secondary | ICD-10-CM

## 2017-03-23 DIAGNOSIS — Z3202 Encounter for pregnancy test, result negative: Secondary | ICD-10-CM

## 2017-03-23 LAB — POCT URINE PREGNANCY: Preg Test, Ur: NEGATIVE

## 2017-03-23 NOTE — Progress Notes (Signed)
Pt is here today for Depo Provera 150mg  injection.  Last injection was given on 12-01-16. Pt states she missed appt due to her child being in the hospital.  Denies any unprotected intercourse in the past 2 weeks.  UPT negative.  Depo Provera 150mg  given.  Pt to follow-up at next scheduled injection.

## 2017-06-08 ENCOUNTER — Ambulatory Visit (INDEPENDENT_AMBULATORY_CARE_PROVIDER_SITE_OTHER): Payer: 59 | Admitting: *Deleted

## 2017-06-08 DIAGNOSIS — Z3042 Encounter for surveillance of injectable contraceptive: Secondary | ICD-10-CM

## 2017-06-08 NOTE — Progress Notes (Signed)
Date last pap: 12/17/15. Last Depo-Provera: 03/23/17. Side Effects if any: None Serum HCG indicated? N/A Depo-Provera 150 mg IM given by: MH Next appointment due 11-13wks

## 2017-06-30 ENCOUNTER — Ambulatory Visit: Payer: 59 | Admitting: Obstetrics & Gynecology

## 2017-07-16 ENCOUNTER — Ambulatory Visit: Payer: 59 | Admitting: Obstetrics & Gynecology

## 2017-07-16 DIAGNOSIS — Z01419 Encounter for gynecological examination (general) (routine) without abnormal findings: Secondary | ICD-10-CM

## 2017-08-24 ENCOUNTER — Ambulatory Visit: Payer: 59

## 2017-08-31 ENCOUNTER — Ambulatory Visit (INDEPENDENT_AMBULATORY_CARE_PROVIDER_SITE_OTHER): Payer: 59 | Admitting: *Deleted

## 2017-08-31 DIAGNOSIS — Z3042 Encounter for surveillance of injectable contraceptive: Secondary | ICD-10-CM

## 2017-09-11 ENCOUNTER — Ambulatory Visit: Payer: Self-pay

## 2017-09-11 NOTE — Telephone Encounter (Signed)
Patient called in with c/o "diarrhea, vomiting." She reports having >20 stools since 0100 and vomiting 10-15 times. She reports having abdominal pain, constant, rate 9 on pain scale. When asked could she keep liquids down, she says she's only had 1 cup water and it made her have a diarrhea stool, she has not voided since between 11-398. She says she is having dizziness, denies bloody stools. According to the protocol, she is to go to the ED or UC. She states "I can't go until after 11pm when my son's nurse comes in because I'm his only caregiver." When asked will she have someone to drive her, she stated "yes, my brother will drive me." Care advice given, patient verbalized understanding.  Reason for Disposition . [1] Drinking very little AND [2] dehydration suspected (e.g., no urine > 12 hours, very dry mouth, very lightheaded)  Answer Assessment - Initial Assessment Questions 1. DIARRHEA SEVERITY: "How bad is the diarrhea?" "How many extra stools have you had in the past 24 hours than normal?"    - MILD: Few loose or mushy BMs; increase of 1-3 stools over normal daily number of stools; mild increase in ostomy output.   - MODERATE: Increase of 4-6 stools daily over normal; moderate increase in ostomy output.   - SEVERE (or Worst Possible): Increase of 7 or more stools daily over normal; moderate increase in ostomy output; incontinence.     Over 20-watery 2. ONSET: "When did the diarrhea begin?"      1 am today 3. BM CONSISTENCY: "How loose or watery is the diarrhea?"      Watery-yellowish 4. VOMITING: "Are you also vomiting?" If so, ask: "How many times in the past 24 hours?"      Yes-maybe 10-15 5. ABDOMINAL PAIN: "Are you having any abdominal pain?" If yes: "What does it feel like?" (e.g., crampy, dull, intermittent, constant)     Yes-constant pain 6. ABDOMINAL PAIN SEVERITY: If present, ask: "How bad is the pain?"  (e.g., Scale 1-10; mild, moderate, or severe)    - MILD (1-3): doesn't  interfere with normal activities, abdomen soft and not tender to touch     - MODERATE (4-7): interferes with normal activities or awakens from sleep, tender to touch     - SEVERE (8-10): excruciating pain, doubled over, unable to do any normal activities       9 7. ORAL INTAKE: If vomiting, "Have you been able to drink liquids?" "How much fluids have you had in the past 24 hours?"     1 cup water 8. HYDRATION: "Any signs of dehydration?" (e.g., dry mouth [not just dry lips], too weak to stand, dizziness, new weight loss) "When did you last urinate?"     Dizziness, urinated around 3 am-4 am.  9. EXPOSURE: "Have you traveled to a foreign country recently?" "Have you been exposed to anyone with diarrhea?" "Could you have eaten any food that was spoiled?"     Not out of the country, not exposed to anyone with diarrhea, unsure if ate food that was spoiled 10. OTHER SYMPTOMS: "Do you have any other symptoms?" (e.g., fever, blood in stool)       No 11. PREGNANCY: "Is there any chance you are pregnant?" "When was your last menstrual period?"       No-LMP last week  Protocols used: DIARRHEA-A-AH

## 2017-09-14 ENCOUNTER — Telehealth: Payer: Self-pay | Admitting: Internal Medicine

## 2017-09-14 NOTE — Telephone Encounter (Signed)
Copied from CRM [id number]. Topic: Quick Communication - See Telephone Encounter >> Sep 14, 2017  8:31 AM Jolayne Hainesaylor, Brittany L wrote: CRM for notification. See Telephone encounter for:   09/14/17.  Pt called in to make an appt for a stomach bug that she has had since Friday. There were no avail appts & did not want to go to another location. Offered her Nurse Triage, line was busy & did not want to wait. She would like a call back as if there could be something called in for her to help with the throwing up & nauseous. Please call pt back @ [phone]  [facility] [address]

## 2017-09-14 NOTE — Telephone Encounter (Signed)
Called pt. And left message that Triage could not call anything in for nausea. Instructed if she felt worse or dehydrated, she may need to go to Urgent care.

## 2017-11-16 ENCOUNTER — Ambulatory Visit: Payer: 59

## 2017-11-17 ENCOUNTER — Ambulatory Visit: Payer: 59 | Admitting: *Deleted

## 2017-11-17 DIAGNOSIS — Z3042 Encounter for surveillance of injectable contraceptive: Secondary | ICD-10-CM

## 2017-11-17 NOTE — Progress Notes (Signed)
Date last pap: 12/17/15. Last Depo-Provera: 09/12/16. Side Effects if any: n/a. Serum HCG indicated? n/a. Depo-Provera 150 mg IM given by: mh. Next appointment due 11-13.

## 2017-11-18 NOTE — Progress Notes (Signed)
Patient seen and assessed by nursing staff.  Agree with documentation and plan.  

## 2018-01-11 ENCOUNTER — Ambulatory Visit: Payer: 59 | Admitting: Obstetrics & Gynecology

## 2018-02-02 ENCOUNTER — Encounter: Payer: Self-pay | Admitting: Obstetrics & Gynecology

## 2018-02-02 ENCOUNTER — Ambulatory Visit: Payer: 59

## 2018-02-02 ENCOUNTER — Ambulatory Visit (INDEPENDENT_AMBULATORY_CARE_PROVIDER_SITE_OTHER): Payer: 59 | Admitting: Obstetrics & Gynecology

## 2018-02-02 VITALS — BP 130/84 | HR 72 | Ht 68.5 in | Wt 169.0 lb

## 2018-02-02 DIAGNOSIS — N879 Dysplasia of cervix uteri, unspecified: Secondary | ICD-10-CM

## 2018-02-02 DIAGNOSIS — Z1151 Encounter for screening for human papillomavirus (HPV): Secondary | ICD-10-CM

## 2018-02-02 DIAGNOSIS — Z3042 Encounter for surveillance of injectable contraceptive: Secondary | ICD-10-CM | POA: Diagnosis not present

## 2018-02-02 DIAGNOSIS — Z124 Encounter for screening for malignant neoplasm of cervix: Secondary | ICD-10-CM

## 2018-02-02 DIAGNOSIS — Z01419 Encounter for gynecological examination (general) (routine) without abnormal findings: Secondary | ICD-10-CM | POA: Diagnosis not present

## 2018-02-02 MED ORDER — MEDROXYPROGESTERONE ACETATE 150 MG/ML IM SUSP
150.0000 mg | INTRAMUSCULAR | 3 refills | Status: DC
Start: 2018-02-02 — End: 2019-02-21

## 2018-02-02 NOTE — Progress Notes (Signed)
GYNECOLOGY ANNUAL PREVENTATIVE CARE ENCOUNTER NOTE  Subjective:   Heidi Woods is a 37 y.o. [id number] female here for a routine annual gynecologic exam.  Current complaints: none.   Denies abnormal vaginal bleeding, discharge, pelvic pain, problems with intercourse or other gynecologic concerns.    Gynecologic History Patient's last menstrual period was 02/02/2018. Contraception: Depo-Provera injections Last Pap: LGSIL on 12/17/2015, benign subsequent colposcopy  Obstetric History OB History  Gravida Para Term Preterm AB Living  0 9 2  SAB TAB Ectopic Multiple Live Births  2 7 0 0 2    # Outcome Date GA Lbr Len/2nd Weight Sex Delivery Anes PTL Lv  13 Term 07/22/16 [redacted]w[redacted]d 07:44 / 00:22 6 lb 11.2 oz (3.04 kg) M Vag-Spont None  LIV  12 Term 09/24/13 [redacted]w[redacted]d 06:00 / 02:34 8 lb 9.6 oz (3.9 kg) M Vag-Spont EPI  LIV  11 Term 2007   6 lb 2 oz (2.778 kg) M Vag-Spont     10 Term 2004   7 lb 6 oz (3.345 kg) F Vag-Spont     9 TAB           8 TAB           7 TAB           6 TAB           5 TAB           4 TAB           3 TAB           2 SAB           1 SAB             Past Medical History:  Diagnosis Date  . Anemia   . 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 Heterozygotes are clinically asymptomatic with no hematologic abnormalities, while those homozygous for HbO-Arab may have a mild, asymptomatic, compensated hemolytic anemia.  Hemoglobin O-Arab is of clinical   . LGSIL (low grade squamous intraepithelial lesion) on Pap smear 04/05/2013   Also had abnormal pap smear at age 1-22, had normal colpo and paps until 2014    Past Surgical History:  Procedure Laterality Date  . CHOLECYSTECTOMY   2012  . INDUCED ABORTION     x 7  . umbbilical hernia repair      Current Outpatient Medications on File Prior to Visit  Medication Sig Dispense Refill  . ferrous sulfate (FERROUSUL) 325 (65 FE) MG tablet Take 1 tablet (325 mg total) by mouth 2 (two) times daily with a meal. 60 tablet 2  . ibuprofen (ADVIL,MOTRIN) 600 MG tablet Take 1 tablet (600 mg total) by mouth every 6 (six) hours as needed. 30 tablet 0   Current Facility-Administered Medications on File Prior to Visit  Medication Dose Route Frequency Provider Last Rate Last Dose  . medroxyPROGESTERone (DEPO-PROVERA) injection 150 mg  150 mg Intramuscular Q90 days Reva Bores, MD   150 mg at 08/31/17 1651    No Known Allergies  Social History   Socioeconomic History  . Marital status: Single    Spouse name: Not on file  . Number of children: Not  on file  . Years of education: Not on file  . Highest education level: Not on file  Occupational History  . Not on file  Social Needs  . Financial resource strain: Not on file  . Food insecurity:    Worry: Not on file    Inability: Not on file  . Transportation needs:    Medical: Not on file    Non-medical: Not on file  Tobacco Use  . Smoking status: Never Smoker  . Smokeless tobacco: Never Used  Substance and Sexual Activity  . Alcohol use: No  . Drug use: No  . Sexual activity: Yes    Partners: Male    Birth control/protection: Condom  Lifestyle  . Physical activity:    Days per week: Not on file    Minutes per session: Not on file  . Stress: Not on file  Relationships  . Social connections:    Talks on phone: Not on file    Gets together: Not on file    Attends religious service: Not on file    Active member of club or organization: Not on file    Attends meetings of clubs or organizations: Not on file    Relationship status: Not on file  . Intimate partner violence:    Fear of current or ex partner: Not on file    Emotionally abused: Not on file     Physically abused: Not on file    Forced sexual activity: Not on file  Other Topics Concern  . Not on file  Social History Narrative  . Not on file    Family History  Problem Relation Age of Onset  . Sickle cell anemia Father   . Cancer Father        prostate  . Cancer Maternal Grandfather        prostate  . Hypertension Maternal Grandfather   . Cancer Paternal Grandfather        throat, lung  . Hypertension Maternal Grandmother   . Cancer Maternal Grandmother        colon  . Throat cancer Maternal Uncle     The following portions of the patient's history were reviewed and updated as appropriate: allergies, current medications, past family history, past medical history, past social history, past surgical history and problem list.  Review of Systems Pertinent items noted in HPI and remainder of comprehensive ROS otherwise negative.   Objective:  BP 130/84   Pulse 72   Ht 5' 8.5" (1.74 m)   Wt 169 lb (76.7 kg)   LMP 02/02/2018   Breastfeeding? No   BMI 25.32 kg/m  CONSTITUTIONAL: Well-developed, well-nourished female in no acute distress.  HENT:  Normocephalic, atraumatic, External right and left ear normal. Oropharynx is clear and moist EYES: Conjunctivae and EOM are normal. Pupils are equal, round, and reactive to light. No scleral icterus.  NECK: Normal range of motion, supple, no masses.  Normal thyroid.  SKIN: Skin is warm and dry. No rash noted. Not diaphoretic. No erythema. No pallor. NEUROLOGIC: Alert and oriented to person, place, and time. Normal reflexes, muscle tone coordination. No cranial nerve deficit noted. PSYCHIATRIC: Normal mood and affect. Normal behavior. Normal judgment and thought content. CARDIOVASCULAR: Normal heart rate noted, regular rhythm RESPIRATORY: Clear to auscultation bilaterally. Effort and breath sounds normal, no problems with respiration noted. BREASTS: Symmetric in size. No masses, skin changes, nipple drainage, or  lymphadenopathy. ABDOMEN: Soft, normal bowel sounds, no distention noted.  No tenderness, rebound or guarding.  PELVIC: Normal appearing external genitalia; normal appearing vaginal mucosa and cervix.  No abnormal discharge noted.  Pap smear obtained.  Normal uterine size, no other palpable masses, no uterine or adnexal tenderness. MUSCULOSKELETAL: Normal range of motion. No tenderness.  No cyanosis, clubbing, or edema.  2+ distal pulses.   Assessment and Plan:  1. Encounter for gynecological examination with Papanicolaou smear of cervix 2. Cervical dysplasia - Cytology - PAP Will follow up results of pap smear and manage accordingly.  3. Surveillance for Depo-Provera contraception Depo Provera to be given soon; last dose was 11/17/2017. - medroxyPROGESTERone (DEPO-PROVERA) 150 MG/ML injection; Inject 1 mL (150 mg total) into the muscle every 3 (three) months.  Dispense: 1 mL; Refill: 3   Routine preventative health maintenance measures emphasized. Please refer to After Visit Summary for other counseling recommendations.    Jaynie Collins, MD, FACOG Obstetrician & Gynecologist, [facility] for [employer], [facility]

## 2018-02-02 NOTE — Patient Instructions (Addendum)
Laparoscopic Tubal Ligation Laparoscopic tubal ligation is a procedure to close the fallopian tubes. This is done so that you cannot get pregnant. When the fallopian tubes are closed, the eggs that your ovaries release cannot enter the uterus, and sperm cannot reach the released eggs. A laparoscopic tubal ligation is sometimes called "getting your tubes tied." You should not have this procedure if you want to get pregnant someday or if you are unsure about having more children. Tell a health care provider about:  Any allergies you have.  All medicines you are taking, including vitamins, herbs, eye drops, creams, and over-the-counter medicines.  Any problems you or family members have had with anesthetic medicines.  Any blood disorders you have.  Any surgeries you have had.  Any medical conditions you have.  Whether you are pregnant or may be pregnant.  Any past pregnancies. What are the risks? Generally, this is a safe procedure. However, problems may occur, including:  Infection.  Bleeding.  Injury to surrounding organs.  Side effects from anesthetics.  Failure of the procedure.  This procedure can increase your risk of a kind of pregnancy in which a fertilized egg attaches to the outside of the uterus (ectopic pregnancy). What happens before the procedure?  Ask your health care provider about: ? Changing or stopping your regular medicines. This is especially important if you are taking diabetes medicines or blood thinners. ? Taking medicines such as aspirin and ibuprofen. These medicines can thin your blood. Do not take these medicines before your procedure if your health care provider instructs you not to.  Follow instructions from your health care provider about eating and drinking restrictions.  Plan to have someone take you home after the procedure.  If you go home right after the procedure, plan to have someone with you for 24 hours. What happens during the  procedure?  You will be given one or more of the following: ? A medicine to help you relax (sedative). ? A medicine to numb the area (local anesthetic). ? A medicine to make you fall asleep (general anesthetic). ? A medicine that is injected into an area of your body to numb everything below the injection site (regional anesthetic).  An IV tube will be inserted into one of your veins. It will be used to give you medicines and fluids during the procedure.  Your bladder may be emptied with a small tube (catheter).  If you have been given a general anesthetic, a tube will be put down your throat to help you breathe.  Two small cuts (incisions) will be made in your lower abdomen and near your belly button.  Your abdomen will be inflated with a gas. This will let the surgeon see better and will give the surgeon room to work.  A thin, lighted tube (laparoscope) with a camera attached will be inserted into your abdomen through one of the incisions. Small instruments will be inserted through the other incision.  The fallopian tubes will be tied off, burned (cauterized), or blocked with a clip, ring, or clamp. A small portion in the center of each fallopian tube may be removed.  The gas will be released from the abdomen.  The incisions will be closed with stitches (sutures).  A bandage (dressing) will be placed over the incisions. The procedure may vary among health care providers and hospitals. What happens after the procedure?  Your blood pressure, heart rate, breathing rate, and blood oxygen level will be monitored often until the medicines  you were given have worn off.  You will be given medicine to help with pain, nausea, and vomiting as needed. This information is not intended to replace advice given to you by your health care provider. Make sure you discuss any questions you have with your health care provider. Document Released: 12/08/2000 Document Revised: 02/07/2016 Document  Reviewed: 08/12/2015 Elsevier Interactive Patient Education  2018 [place] 18-39 Years, Female Preventive care refers to lifestyle choices and visits with your health care provider that can promote health and wellness. What does preventive care include?  A yearly physical exam. This is also called an annual well check.  Dental exams once or twice a year.  Routine eye exams. Ask your health care provider how often you should have your eyes checked.  Personal lifestyle choices, including: ? Daily care of your teeth and gums. ? Regular physical activity. ? Eating a healthy diet. ? Avoiding tobacco and drug use. ? Limiting alcohol use. ? Practicing safe sex. ? Taking vitamin and mineral supplements as recommended by your health care provider. What happens during an annual well check? The services and screenings done by your health care provider during your annual well check will depend on your age, overall health, lifestyle risk factors, and family history of disease. Counseling Your health care provider may ask you questions about your:  Alcohol use.  Tobacco use.  Drug use.  Emotional well-being.  Home and relationship well-being.  Sexual activity.  Eating habits.  Work and work Statistician.  Method of birth control.  Menstrual cycle.  Pregnancy history.  Screening You may have the following tests or measurements:  Height, weight, and BMI.  Diabetes screening. This is done by checking your blood sugar (glucose) after you have not eaten for a while (fasting).  Blood pressure.  Lipid and cholesterol levels. These may be checked every 5 years starting at age 34.  Skin check.  Hepatitis C blood test.  Hepatitis B blood test.  Sexually transmitted disease (STD) testing.  BRCA-related cancer screening. This may be done if you have a family history of breast, ovarian, tubal, or peritoneal cancers.  Pelvic exam and Pap test. This  may be done every 3 years starting at age 13. Starting at age 56, this may be done every 5 years if you have a Pap test in combination with an HPV test.  Discuss your test results, treatment options, and if necessary, the need for more tests with your health care provider. Vaccines Your health care provider may recommend certain vaccines, such as:  Influenza vaccine. This is recommended every year.  Tetanus, diphtheria, and acellular pertussis (Tdap, Td) vaccine. You may need a Td booster every 10 years.  Varicella vaccine. You may need this if you have not been vaccinated.  HPV vaccine. If you are 49 or younger, you may need three doses over 6 months.  Measles, mumps, and rubella (MMR) vaccine. You may need at least one dose of MMR. You may also need a second dose.  Pneumococcal 13-valent conjugate (PCV13) vaccine. You may need this if you have certain conditions and were not previously vaccinated.  Pneumococcal polysaccharide (PPSV23) vaccine. You may need one or two doses if you smoke cigarettes or if you have certain conditions.  Meningococcal vaccine. One dose is recommended if you are age 42-21 years and a first-year college student living in a residence hall, or if you have one of several medical conditions. You may also need additional booster  doses.  Hepatitis A vaccine. You may need this if you have certain conditions or if you travel or work in places where you may be exposed to hepatitis A.  Hepatitis B vaccine. You may need this if you have certain conditions or if you travel or work in places where you may be exposed to hepatitis B.  Haemophilus influenzae type b (Hib) vaccine. You may need this if you have certain risk factors.  Talk to your health care provider about which screenings and vaccines you need and how often you need them. This information is not intended to replace advice given to you by your health care provider. Make sure you discuss any questions you have  with your health care provider. Document Released: 10/28/2001 Document Revised: 05/21/2016 Document Reviewed: 07/03/2015 Elsevier Interactive Patient Education  [employer].

## 2018-02-03 ENCOUNTER — Ambulatory Visit (INDEPENDENT_AMBULATORY_CARE_PROVIDER_SITE_OTHER): Payer: 59 | Admitting: *Deleted

## 2018-02-03 DIAGNOSIS — Z3042 Encounter for surveillance of injectable contraceptive: Secondary | ICD-10-CM | POA: Diagnosis not present

## 2018-02-03 MED ORDER — MEDROXYPROGESTERONE ACETATE 150 MG/ML IM SUSP
150.0000 mg | INTRAMUSCULAR | Status: AC
  Administered 2018-02-03: 150 mg via INTRAMUSCULAR

## 2018-02-05 LAB — CYTOLOGY - PAP
DIAGNOSIS: NEGATIVE
HPV (WINDOPATH): NOT DETECTED

## 2018-04-15 ENCOUNTER — Ambulatory Visit (INDEPENDENT_AMBULATORY_CARE_PROVIDER_SITE_OTHER): Payer: 59

## 2018-04-15 VITALS — BP 135/72 | HR 71

## 2018-04-15 DIAGNOSIS — Z3042 Encounter for surveillance of injectable contraceptive: Secondary | ICD-10-CM

## 2018-04-15 NOTE — Progress Notes (Signed)
Patient presented to the office for her depo provera 150 mg given in right gluteal. Patient is currently a couple of days early for her depo-provera and patient reports Dr.Anyanwu stated it would be ok for her to received it a little sooner since her periods have been lasting longer than expected. Patient tolerated well and will follow up in three months.

## 2018-04-21 ENCOUNTER — Ambulatory Visit: Payer: 59

## 2018-07-01 ENCOUNTER — Ambulatory Visit: Payer: 59

## 2018-07-08 ENCOUNTER — Ambulatory Visit (INDEPENDENT_AMBULATORY_CARE_PROVIDER_SITE_OTHER): Payer: 59 | Admitting: *Deleted

## 2018-07-08 VITALS — BP 128/84 | HR 97

## 2018-07-08 DIAGNOSIS — Z3042 Encounter for surveillance of injectable contraceptive: Secondary | ICD-10-CM

## 2018-07-08 NOTE — Progress Notes (Signed)
Date last pap: 02/02/2018 Last Depo-Provera: 04/15/2018. Side Effects if any: none. Serum HCG indicated? NA. Depo-Provera 150 mg IM given by: Scheryl Marten, RN. Next appointment due 09/23/2018-10/07/2018.

## 2018-07-12 NOTE — Progress Notes (Signed)
I have reviewed the chart and agree with nursing staff's documentation of this patient's encounter.  Fisher Bing, MD 07/12/2018 9:43 AM

## 2018-09-23 ENCOUNTER — Ambulatory Visit: Payer: 59

## 2018-09-23 ENCOUNTER — Ambulatory Visit (INDEPENDENT_AMBULATORY_CARE_PROVIDER_SITE_OTHER): Payer: 59 | Admitting: *Deleted

## 2018-09-23 VITALS — BP 135/85 | HR 92 | Wt 173.0 lb

## 2018-09-23 DIAGNOSIS — Z3042 Encounter for surveillance of injectable contraceptive: Secondary | ICD-10-CM

## 2018-09-23 MED ORDER — MEDROXYPROGESTERONE ACETATE 150 MG/ML IM SUSP
150.0000 mg | Freq: Once | INTRAMUSCULAR | Status: AC
Start: 2018-09-23 — End: 2018-09-23
  Administered 2018-09-23: 150 mg via INTRAMUSCULAR

## 2018-09-23 NOTE — Addendum Note (Signed)
Addended by: Scheryl Marten on: 09/23/2018 11:10 AM   Modules accepted: Orders

## 2018-09-23 NOTE — Progress Notes (Signed)
Date last pap: 02/02/2018. Last Depo-Provera: 07/08/2018. Side Effects if any: none. Serum HCG indicated?NA. Depo-Provera 150 mg IM given by: Scheryl Marten, RN. Next appointment due 12/10/2018-12/24/2018.

## 2018-09-24 NOTE — Progress Notes (Signed)
I have reviewed the chart and agree with nursing staff's documentation of this patient's encounter.  Loomis Bing, MD 09/24/2018 3:31 PM

## 2018-10-18 ENCOUNTER — Encounter: Payer: Self-pay | Admitting: Family Medicine

## 2018-10-18 ENCOUNTER — Ambulatory Visit (INDEPENDENT_AMBULATORY_CARE_PROVIDER_SITE_OTHER): Payer: 59 | Admitting: Family Medicine

## 2018-10-18 VITALS — BP 129/87 | HR 72 | Wt 175.4 lb

## 2018-10-18 DIAGNOSIS — B354 Tinea corporis: Secondary | ICD-10-CM

## 2018-10-18 MED ORDER — NYSTATIN 100000 UNIT/GM EX POWD: Freq: Four times a day (QID) | CUTANEOUS | 0 refills | Status: DC

## 2018-10-18 NOTE — Patient Instructions (Signed)

## 2018-10-18 NOTE — Progress Notes (Signed)
Bumps are breast for a couple month. Patient reports some itching in the area.

## 2018-10-18 NOTE — Progress Notes (Signed)
Patient noticed bumps around breast for several months with several months.

## 2018-10-18 NOTE — Progress Notes (Signed)
    Subjective:    Patient ID: Heidi Woods is a 38 y.o. female presenting with Check on bumps  on 10/18/2018  HPI: Has small flesh colored bumps under her bra. Starting to itch. There x several months. Now coming up over the top of breasts. Wearing normal bra. Not nursing.  Review of Systems    Objective:    BP 129/87   Pulse 72   Wt 175 lb 6.4 oz (79.6 kg)   BMI 26.28 kg/m  Physical Exam Chest:     Breasts:        Right: Skin change present.        Left: Skin change present.     Comments: Dry crusting areas around nipple, erythema noted under and in between breasts        Assessment & Plan:  Tinea corporis - keep area clean and dry, trial of Nystatin. May use deodorant or like to keep dry. Do not sleep in bra. Launder frequently in hot water. - Plan: nystatin (MYCOSTATIN/NYSTOP) powder   Total face-to-face time with patient: 10 minutes. Over 50% of encounter was spent on counseling and coordination of care. Return if symptoms worsen or fail to improve.  Reva Bores 10/18/2018 2:09 PM

## 2018-10-26 ENCOUNTER — Telehealth: Payer: Self-pay

## 2018-10-26 NOTE — Telephone Encounter (Signed)
Pt lvm stating she was seen at UC over the weekend and dx, flu.  Treated with Tamiflu and told not to return to work until today.  However, still not 100%.  C/o chills, cough and some lightheadedness.  Pt is asking if she needs a f/u OV to be cleared to go back to work.  Pls advise pt at [phone].

## 2018-10-26 NOTE — Telephone Encounter (Signed)
Noted  

## 2018-10-26 NOTE — Telephone Encounter (Signed)
Does she have to have a note? If so, then yes, she needs an OV. If she doesn't need a note, she can go back when she is feeling better.

## 2018-10-26 NOTE — Telephone Encounter (Signed)
Pt has appt 10/27/18 at 10:40 with Allayne Gitelman NP.

## 2018-10-26 NOTE — Telephone Encounter (Signed)
Left detailed message on VM per DPR. 

## 2018-10-27 ENCOUNTER — Encounter: Payer: Self-pay | Admitting: Primary Care

## 2018-10-27 ENCOUNTER — Ambulatory Visit (INDEPENDENT_AMBULATORY_CARE_PROVIDER_SITE_OTHER): Payer: 59 | Admitting: Primary Care

## 2018-10-27 ENCOUNTER — Ambulatory Visit: Payer: 59 | Admitting: Family Medicine

## 2018-10-27 VITALS — BP 112/72 | HR 98 | Temp 97.5°F | Ht 68.5 in | Wt 174.0 lb

## 2018-10-27 DIAGNOSIS — J111 Influenza due to unidentified influenza virus with other respiratory manifestations: Secondary | ICD-10-CM | POA: Diagnosis not present

## 2018-10-27 NOTE — Progress Notes (Signed)
Subjective:    Patient ID: Heidi Woods, female    DOB: 02-01-1981, 38 y.o.   MRN: [id number]  HPI  Heidi Woods is a 38 year old female who presents today   She was evaluated 3 days ago at [facility]gent Care, diagnosed with influenza and treated with Tamiflu. She called in yesterday requesting a work note as she still didn't feel able to return to work. She was asked to come in today for re-evaluation.  She is compliant to her Tamiflu twice daily. She continues to feel lightheaded, some cough. She denies a fever over the last 36 hours. She was able to sleep throughout the night last night without coughing. Fatigue has improved. She's been out of work since Monday through today and feels as though she can return to work tomorrow.   Review of Systems  Constitutional: Negative for chills, fatigue and fever.  HENT: Positive for congestion. Negative for ear pain and sore throat.   Respiratory: Positive for cough. Negative for shortness of breath and wheezing.   Musculoskeletal: Negative for myalgias.       Past Medical History:  Diagnosis Date  . Anemia   . 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 Heterozygotes are clinically asymptomatic with no hematologic abnormalities, while those homozygous for HbO-Arab may have a mild, asymptomatic, compensated hemolytic anemia.  Hemoglobin O-Arab is of clinical   . LGSIL (low grade squamous intraepithelial lesion) on Pap smear 04/05/2013   Also had abnormal pap smear at age 62-22, had normal colpo and paps until 2014     Social History   Socioeconomic History  . Marital status: Single    Spouse name: Not on file  . Number of children: Not on file  . Years of  education: Not on file  . Highest education level: Not on file  Occupational History  . Not on file  Social Needs  . Financial resource strain: Not on file  . Food insecurity:    Worry: Not on file    Inability: Not on file  . Transportation needs:    Medical: Not on file    Non-medical: Not on file  Tobacco Use  . Smoking status: Never Smoker  . Smokeless tobacco: Never Used  Substance and Sexual Activity  . Alcohol use: No  . Drug use: No  . Sexual activity: Yes    Partners: Male    Birth control/protection: Condom  Lifestyle  . Physical activity:    Days per week: Not on file    Minutes per session: Not on file  . Stress: Not on file  Relationships  . Social connections:    Talks on phone: Not on file    Gets together: Not on file    Attends religious service: Not on file    Active member of club or organization: Not on file    Attends meetings of clubs or organizations: Not on file    Relationship status: Not on file  . Intimate partner violence:    Fear of current or ex partner: Not on file    Emotionally abused: Not on file    Physically abused: Not on file    Forced sexual activity: Not on file  Other  Topics Concern  . Not on file  Social History Narrative  . Not on file    Past Surgical History:  Procedure Laterality Date  . CHOLECYSTECTOMY  2012  . INDUCED ABORTION     x 7  . umbbilical hernia repair      Family History  Problem Relation Age of Onset  . Sickle cell anemia Father   . Cancer Father        prostate  . Cancer Maternal Grandfather        prostate  . Hypertension Maternal Grandfather   . Cancer Paternal Grandfather        throat, lung  . Hypertension Maternal Grandmother   . Cancer Maternal Grandmother        colon  . Throat cancer Maternal Uncle     No Known Allergies  Current Outpatient Medications on File Prior to Visit  Medication Sig Dispense Refill  . ferrous sulfate (FERROUSUL) 325 (65 FE) MG tablet Take 1 tablet (325  mg total) by mouth 2 (two) times daily with a meal. 60 tablet 2  . medroxyPROGESTERone (DEPO-PROVERA) 150 MG/ML injection Inject 1 mL (150 mg total) into the muscle every 3 (three) months. 1 mL 3  . nystatin (MYCOSTATIN/NYSTOP) powder Apply topically 4 (four) times daily. 15 g 0  . ibuprofen (ADVIL,MOTRIN) 600 MG tablet Take 1 tablet (600 mg total) by mouth every 6 (six) hours as needed. (Patient not taking: Reported on 10/18/2018) 30 tablet 0   Current Facility-Administered Medications on File Prior to Visit  Medication Dose Route Frequency Provider Last Rate Last Dose  . medroxyPROGESTERone (DEPO-PROVERA) injection 150 mg  150 mg Intramuscular Q90 days Reva Bores, MD   150 mg at 07/08/18 0818    BP 112/72   Pulse 98   Temp (!) 97.5 F (36.4 C) (Oral)   Ht 5' 8.5" (1.74 m)   Wt 174 lb (78.9 kg)   SpO2 98%   BMI 26.07 kg/m    Objective:   Physical Exam  Constitutional: She appears well-nourished. She does not appear ill.  HENT:  Right Ear: Tympanic membrane and ear canal normal.  Left Ear: Tympanic membrane and ear canal normal.  Nose: No mucosal edema. Right sinus exhibits no maxillary sinus tenderness and no frontal sinus tenderness. Left sinus exhibits no maxillary sinus tenderness and no frontal sinus tenderness.  Mouth/Throat: Oropharynx is clear and moist.  Neck: Neck supple.  Cardiovascular: Normal rate and regular rhythm.  Respiratory: Effort normal and breath sounds normal. She has no wheezes.  Dry cough during exam  Skin: Skin is warm and dry.           Assessment & Plan:  Influenza:  Diagnosed at Urgent Care three days ago, compliant to Tamiflu and feeling better. Exam today overall stable. Appears well. Vitals WNL. Has been afebrile for at least 24 hours.  Will excuse patient from work Monday through Wednesday this week, okay to return tomorrow as long as she is fever free. Return precautions provided.  Doreene Nest, NP

## 2018-10-27 NOTE — Patient Instructions (Signed)
You may return to work tomorrow as discussed.  Continue Tamiflu as prescribed, make sure to complete all of the medication.  It was a pleasure meeting you!

## 2018-10-28 NOTE — Telephone Encounter (Signed)
Pt request cb when note for work is ready for pick up. Today pt has light headache and prod cough with yellow phlegm. Cough is making it difficult to sleep and Nyquil did not help. Pt request cough med.No fever today. [facility].

## 2018-11-04 ENCOUNTER — Ambulatory Visit: Payer: 59 | Admitting: Internal Medicine

## 2018-12-09 ENCOUNTER — Other Ambulatory Visit: Payer: Self-pay

## 2018-12-09 ENCOUNTER — Ambulatory Visit (INDEPENDENT_AMBULATORY_CARE_PROVIDER_SITE_OTHER): Payer: 59

## 2018-12-09 DIAGNOSIS — Z3042 Encounter for surveillance of injectable contraceptive: Secondary | ICD-10-CM | POA: Diagnosis not present

## 2018-12-09 NOTE — Progress Notes (Signed)
Patient presented to the office for depo-provera injection received in right gluteal area. NDC [phone].  Patient is a day early however Dr.Pratt has ok for her to have early.   Last Pap:02/03/2019  Next injection: 02/24/2019

## 2018-12-09 NOTE — Progress Notes (Signed)
Patient seen and assessed by nursing staff.  Agree with documentation and plan.  

## 2018-12-10 ENCOUNTER — Ambulatory Visit: Payer: 59

## 2019-02-21 ENCOUNTER — Other Ambulatory Visit: Payer: Self-pay | Admitting: Obstetrics & Gynecology

## 2019-02-21 DIAGNOSIS — Z3042 Encounter for surveillance of injectable contraceptive: Secondary | ICD-10-CM

## 2019-02-24 ENCOUNTER — Other Ambulatory Visit: Payer: Self-pay

## 2019-02-24 ENCOUNTER — Ambulatory Visit (INDEPENDENT_AMBULATORY_CARE_PROVIDER_SITE_OTHER): Payer: 59 | Admitting: *Deleted

## 2019-02-24 VITALS — BP 123/79 | HR 103

## 2019-02-24 DIAGNOSIS — Z3042 Encounter for surveillance of injectable contraceptive: Secondary | ICD-10-CM | POA: Diagnosis not present

## 2019-02-24 NOTE — Progress Notes (Signed)
Date last pap: 02/02/2018. Last Depo-Provera: 12/09/2018. Side Effects if any: None. Serum HCG indicated? NA. Depo-Provera 150 mg IM given by: Crosby Oyster RN Next appointment due 05/12/2019-9/06-2019.

## 2019-03-04 NOTE — Progress Notes (Signed)
I have reviewed the chart and agree with nursing staff's documentation of this patient's encounter.  Ariston Grandison, MD 03/04/2019 3:06 PM    

## 2019-05-12 ENCOUNTER — Ambulatory Visit (INDEPENDENT_AMBULATORY_CARE_PROVIDER_SITE_OTHER): Payer: 59

## 2019-05-12 ENCOUNTER — Other Ambulatory Visit: Payer: Self-pay

## 2019-05-12 VITALS — BP 120/81 | HR 72

## 2019-05-12 DIAGNOSIS — Z3042 Encounter for surveillance of injectable contraceptive: Secondary | ICD-10-CM

## 2019-05-12 NOTE — Progress Notes (Signed)
Patient presented to the office today for her depo-provera received in her left gluteal area. NDC #[address]  B/P: within normal range HCG: not indicated at this time Last depo:02/24/2019 Given by D.Jackson Fetters 150 mg right gluteal Follow up in three months for her next injection.

## 2019-05-20 NOTE — Progress Notes (Signed)
Attestation of Attending Supervision of clinical support staff: I agree with the care provided to this patient and was available for any consultation.  I have reviewed the CMA's note and chart, and I agree with the management and plan.  Jerime Arif Niles Pearse Shiffler, MD, MPH, ABFM Attending Physician [facility]  

## 2019-07-28 ENCOUNTER — Ambulatory Visit (INDEPENDENT_AMBULATORY_CARE_PROVIDER_SITE_OTHER): Payer: 59 | Admitting: *Deleted

## 2019-07-28 ENCOUNTER — Other Ambulatory Visit: Payer: Self-pay

## 2019-07-28 VITALS — BP 119/78 | HR 97 | Wt 183.8 lb

## 2019-07-28 DIAGNOSIS — Z3042 Encounter for surveillance of injectable contraceptive: Secondary | ICD-10-CM

## 2019-07-28 MED ORDER — MEDROXYPROGESTERONE ACETATE 150 MG/ML IM SUSP
150.0000 mg | Freq: Once | INTRAMUSCULAR | Status: AC
  Administered 2019-07-28: 150 mg via INTRAMUSCULAR

## 2019-07-28 NOTE — Progress Notes (Signed)
Date last pap: 01/2018 Last Depo-Provera: 05/12/2019 Side Effects if any: N/A Serum HCG indicated? N/A Depo-Provera 150 mg IM given by: Mickey Farber, SMA. Next appointment due: Oct 13 2019 - Oct 27 2019

## 2019-08-17 ENCOUNTER — Ambulatory Visit (INDEPENDENT_AMBULATORY_CARE_PROVIDER_SITE_OTHER): Payer: 59 | Admitting: Internal Medicine

## 2019-08-17 ENCOUNTER — Other Ambulatory Visit: Payer: Self-pay

## 2019-08-17 ENCOUNTER — Encounter: Payer: Self-pay | Admitting: Internal Medicine

## 2019-08-17 VITALS — BP 116/74 | HR 97 | Temp 97.3°F | Wt 185.0 lb

## 2019-08-17 DIAGNOSIS — E049 Nontoxic goiter, unspecified: Secondary | ICD-10-CM

## 2019-08-17 NOTE — Progress Notes (Signed)
Subjective:    Patient ID: Heidi Woods, female    DOB: September 06, 1981, 38 y.o.   MRN: [id number]  HPI  Pt presents to the clinic today with c/o a lump on the left side of her neck. She noticed this about 5 days ago. She states she notices it more with swallowing but otherwise denies pain.. She states this is new and there is no family history of thyroid issues.  She has had some cold intolerance, intermittent palpitations but denies hair loss, dry skin, constipation, diarrhea or fatigue.   Review of Systems  Past Medical History:  Diagnosis Date  . Anemia   . 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 Heterozygotes are clinically asymptomatic with no hematologic abnormalities, while those homozygous for HbO-Arab may have a mild, asymptomatic, compensated hemolytic anemia.  Hemoglobin O-Arab is of clinical   . LGSIL (low grade squamous intraepithelial lesion) on Pap smear 04/05/2013   Also had abnormal pap smear at age 75-22, had normal colpo and paps until 2014    Current Outpatient Medications  Medication Sig Dispense Refill  . ferrous sulfate (FERROUSUL) 325 (65 FE) MG tablet Take 1 tablet (325 mg total) by mouth 2 (two) times daily with a meal. 60 tablet 2  . ibuprofen (ADVIL,MOTRIN) 600 MG tablet Take 1 tablet (600 mg total) by mouth every 6 (six) hours as needed. (Patient not taking: Reported on 10/18/2018) 30 tablet 0  . medroxyPROGESTERone (DEPO-PROVERA) 150 MG/ML injection INJECT 1 ML (150 MG TOTAL) INTO THE MUSCLE EVERY 3 (THREE) MONTHS. 1 mL 3  . nystatin (MYCOSTATIN/NYSTOP) powder Apply topically 4 (four) times daily. (Patient not taking: Reported on 05/12/2019) 15 g 0   Current  Facility-Administered Medications  Medication Dose Route Frequency Provider Last Rate Last Dose  . medroxyPROGESTERone (DEPO-PROVERA) injection 150 mg  150 mg Intramuscular Q90 days Reva Bores, MD   150 mg at 05/12/19 1354    No Known Allergies  Family History  Problem Relation Age of Onset  . Sickle cell anemia Father   . Cancer Father        prostate  . Cancer Maternal Grandfather        prostate  . Hypertension Maternal Grandfather   . Cancer Paternal Grandfather        throat, lung  . Hypertension Maternal Grandmother   . Cancer Maternal Grandmother        colon  . Throat cancer Maternal Uncle     Social History   Socioeconomic History  . Marital status: Single    Spouse name: Not on file  . Number of children: Not on file  . Years of education: Not on file  . Highest education level: Not on file  Occupational History  . Not on file  Social Needs  . Financial resource strain: Not on file  . Food insecurity    Worry: Not on file    Inability: Not on file  . Transportation needs    Medical: Not on file    Non-medical: Not on file  Tobacco Use  . Smoking status: Never Smoker  . Smokeless tobacco: Never Used  Substance and Sexual Activity  . Alcohol use: No  . Drug use: No  .  Sexual activity: Yes    Partners: Male    Birth control/protection: Condom  Lifestyle  . Physical activity    Days per week: Not on file    Minutes per session: Not on file  . Stress: Not on file  Relationships  . Social Musicianconnections    Talks on phone: Not on file    Gets together: Not on file    Attends religious service: Not on file    Active member of club or organization: Not on file    Attends meetings of clubs or organizations: Not on file    Relationship status: Not on file  . Intimate partner violence    Fear of current or ex partner: Not on file    Emotionally abused: Not on file    Physically abused: Not on file    Forced sexual activity: Not on file  Other Topics  Concern  . Not on file  Social History Narrative  . Not on file     Constitutional: Denies fever, malaise, fatigue, headache or abrupt weight changes.  Neck: Pt reports lump of throat. Denies lymph node swelling. Respiratory: Denies difficulty breathing, shortness of breath, cough or sputum production.   Cardiovascular: Denies chest pain, chest tightness, palpitations or swelling in the hands or feet.    No other specific complaints in a complete review of systems (except as listed in HPI above).     Objective:   Physical Exam  BP 116/74   Pulse 97   Temp (!) 97.3 F (36.3 C) (Temporal)   Wt 185 lb (83.9 kg)   SpO2 98%   BMI 27.72 kg/m   Wt Readings from Last 3 Encounters:  07/28/19 183 lb 12.8 oz (83.4 kg)  10/27/18 174 lb (78.9 kg)  10/18/18 175 lb 6.4 oz (79.6 kg)    General: Appears her stated age, well developed in NAD. Neck:  There is a large, palpable, non-tender goiter present on lower anterior left neck. Neck is supple.  Cardiovascular: Normal rate and rhythm. S1,S2 noted.  No murmur, rubs or gallops noted.  Pulmonary/Chest: Normal effort and positive vesicular breath sounds. No respiratory distress. No wheezes, rales or ronchi noted.     BMET    Component Value Date/Time   NA 136 04/04/2013 0834   K 3.3 (L) 04/04/2013 0834   CL 107 04/04/2013 0834   CO2 23 04/04/2013 0834   GLUCOSE 104 (H) 04/04/2013 0834   BUN 11 04/04/2013 0834   CREATININE 0.66 04/04/2013 0834   CALCIUM 8.9 04/04/2013 0834   GFRNONAA >60 04/04/2013 0834   GFRAA >60 04/04/2013 0834    Lipid Panel  No results found for: CHOL, TRIG, HDL, CHOLHDL, VLDL, LDLCALC  CBC    Component Value Date/Time   WBC 5.7 11/03/2016 1350   RBC 4.03 11/03/2016 1350   HGB 10.9 (L) 11/03/2016 1350   HGB 8.5 (L) 04/04/2013 0834   HCT 30.7 (L) 11/03/2016 1350   HCT 18.8 (L) 03/05/2016 1216   PLT 192 11/03/2016 1350   PLT 157 04/04/2013 0834   MCV 76.2 (L) 11/03/2016 1350   MCV 74 (L)  04/04/2013 0834   MCH 27.0 11/03/2016 1350   MCHC 35.4 11/03/2016 1350   RDW 16.3 (H) 11/03/2016 1350   RDW 21.1 (H) 04/04/2013 0834   LYMPHSABS 2.1 11/03/2016 1350   MONOABS 0.3 11/03/2016 1350   EOSABS 0.0 11/03/2016 1350   BASOSABS 0.0 11/03/2016 1350    Hgb A1C No results found for: HGBA1C  Assessment & Plan:  Goiter:  Will get TSH, T3, and Free T4 Will obtain thyroid ultrasound Consider referral to endocrinology pending results  Will follow up after labs and imaging, return precautions discussed Webb Silversmith, NP This visit occurred during the SARS-CoV-2 public health emergency.  Safety protocols were in place, including screening questions prior to the visit, additional usage of staff PPE, and extensive cleaning of exam room while observing appropriate contact time as indicated for disinfecting solutions.

## 2019-08-17 NOTE — Patient Instructions (Signed)
Goiter  A goiter is an enlarged thyroid gland. The thyroid is located in the lower front of the neck. It makes hormones that affect many body parts and systems, including the system that affects how quickly the body burns fuel for energy (metabolism). Most goiters are painless and are not a cause for concern. Some goiters can affect the way your thyroid makes thyroid hormones. Goiters and conditions that cause goiters can be treated, if necessary. What are the causes? Common causes of this condition include:  Lack (deficiency) of a mineral called iodine. The thyroid gland uses iodine to make thyroid hormones.  Diseases that attack healthy cells in the body (autoimmune diseases) and affect thyroid function, such as Graves' disease or Hashimoto's disease. These diseases may cause the body to produce too much thyroid hormone (hyperthyroidism) or too little of the hormone (hypothyroidism).  Conditions that cause inflammation of the thyroid (thyroiditis).  One or more small growths on the thyroid (nodular goiter). Other causes include:  Medical problems caused by abnormal genes that are passed from parent to child (genetic defects).  Thyroid injury or infection.  Tumors that may or may not be cancerous.  Pregnancy.  Certain medicines.  Exposure to radiation. In some cases, the cause may not be known. What increases the risk? This condition is more likely to develop in:  People who do not get enough iodine in their diet.  People who have a family history of goiter.  Women.  People who are older than age 40.  People who smoke tobacco.  People who have had exposure to radiation. What are the signs or symptoms? The main symptom of this condition is swelling in the lower, front part of the neck. This swelling can range from a very small bump to a large lump. Other symptoms may include:  A tight feeling in the throat.  A hoarse voice.  Coughing.  Wheezing.  Difficulty  swallowing or breathing.  Bulging veins in the neck.  Dizziness. When a goiter is the result of an overactive thyroid (hyperthyroidism), symptoms may also include:  Nervousness or restlessness.  Inability to tolerate heat.  Unexplained weight loss.  Diarrhea.  Change in the texture of hair or skin.  Changes in heartbeat, such as skipped beats, extra beats, or a rapid heart rate.  Loss of menstruation.  Shaky hands.  Increased appetite.  Sleep problems. When a goiter is the result of an underactive thyroid (hypothyroidism), symptoms may also include:  Feeling like you have no energy (lethargy).  Inability to tolerate cold.  Weight gain that is not explained by a change in diet or exercise habits.  Dry skin.  Coarse hair.  Irregular menstrual periods.  Constipation.  Sadness or depression.  Fatigue. In some cases, there may not be any symptoms and the thyroid hormone levels may be normal. How is this diagnosed? This condition may be diagnosed based on your symptoms, your medical history, and a physical exam. You may have tests, such as:  Blood tests to check thyroid function.  Imaging tests, such as: ? Ultrasound. ? CT scan. ? MRI. ? Thyroid scan.  Removal of a tissue sample (biopsy) of the goiter or any nodules. The sample will be tested to check for cancer. How is this treated? Treatment for this condition depends on the cause and your symptoms. Treatment may include:  Medicines to regulate thyroid hormone levels.  Anti-inflammatory medicines or steroid medicines, if the goiter is caused by inflammation.  Iodine supplements or changes to your   diet, if the goiter is caused by iodine deficiency.  Radioactive iodine treatment.  Surgery to remove your thyroid. In some cases, you may only need regular check-ups with your health care provider to monitor your condition, and you may not need treatment. Follow these instructions at home:  Follow  instructions from your health care provider about any changes to your diet.  Take over-the-counter and prescription medicines only as told by your health care provider. These include supplements.  Do not use any products that contain nicotine or tobacco, such as cigarettes and e-cigarettes. If you need help quitting, ask your health care provider.  Keep all follow-up visits as told by your health care provider. This is important. Contact a health care provider if:  Your symptoms do not get better with treatment.  You have nausea, vomiting, or diarrhea. Get help right away if:  You have sudden, unexplained confusion or other mental changes.  You have a fever.  You have chest pain.  You have trouble breathing or swallowing.  You suddenly become very weak.  You experience extreme restlessness.  You feel your heart racing. Summary  A goiter is an enlarged thyroid gland.  The thyroid gland is located in the lower front of the neck. It makes hormones that affect many body parts and systems, including the system that affects how quickly the body burns fuel for energy (metabolism).  The main symptom of this condition is swelling in the lower, front part of the neck. This swelling can range from a very small bump to a large lump.  Treatment for this condition depends on the cause and your symptoms. You may need medicines, supplements, or regular monitoring of your condition. This information is not intended to replace advice given to you by your health care provider. Make sure you discuss any questions you have with your health care provider. Document Released: 02/19/2010 Document Revised: 08/14/2017 Document Reviewed: 05/28/2017 Elsevier Patient Education  2020 [employer]  

## 2019-08-18 ENCOUNTER — Encounter: Payer: Self-pay | Admitting: Internal Medicine

## 2019-08-18 LAB — TSH: TSH: 1.23 u[IU]/mL (ref 0.35–4.50)

## 2019-08-18 LAB — T4, FREE: Free T4: 0.82 ng/dL (ref 0.60–1.60)

## 2019-08-18 LAB — T3: T3, Total: 127 ng/dL (ref 76–181)

## 2019-08-26 ENCOUNTER — Ambulatory Visit
Admission: RE | Admit: 2019-08-26 | Discharge: 2019-08-26 | Disposition: A | Discharge status: Home / Self Care | Payer: 59 | Source: Ambulatory Visit | Attending: Internal Medicine | Admitting: Internal Medicine

## 2019-08-26 DIAGNOSIS — E049 Nontoxic goiter, unspecified: Secondary | ICD-10-CM

## 2019-08-29 ENCOUNTER — Encounter: Payer: Self-pay | Admitting: Internal Medicine

## 2019-08-31 ENCOUNTER — Other Ambulatory Visit: Payer: Self-pay | Admitting: Internal Medicine

## 2019-08-31 DIAGNOSIS — E042 Nontoxic multinodular goiter: Secondary | ICD-10-CM

## 2019-08-31 NOTE — Progress Notes (Signed)
US

## 2019-09-01 ENCOUNTER — Other Ambulatory Visit: Payer: Self-pay | Admitting: Internal Medicine

## 2019-09-01 DIAGNOSIS — E042 Nontoxic multinodular goiter: Secondary | ICD-10-CM

## 2019-09-21 ENCOUNTER — Ambulatory Visit
Admission: RE | Admit: 2019-09-21 | Discharge: 2019-09-21 | Disposition: A | Discharge status: Home / Self Care | Payer: 59 | Source: Ambulatory Visit | Attending: Internal Medicine | Admitting: Internal Medicine

## 2019-09-21 ENCOUNTER — Encounter: Payer: Self-pay | Admitting: Internal Medicine

## 2019-09-21 ENCOUNTER — Other Ambulatory Visit (HOSPITAL_COMMUNITY)
Admission: RE | Admit: 2019-09-21 | Discharge: 2019-09-21 | Disposition: A | Discharge status: Home / Self Care | Payer: 59 | Source: Ambulatory Visit | Attending: Physician Assistant | Admitting: Physician Assistant

## 2019-09-21 DIAGNOSIS — E041 Nontoxic single thyroid nodule: Secondary | ICD-10-CM | POA: Insufficient documentation

## 2019-09-21 DIAGNOSIS — E042 Nontoxic multinodular goiter: Secondary | ICD-10-CM

## 2019-09-21 NOTE — Procedures (Signed)
PROCEDURE SUMMARY:  Using direct ultrasound guidance, 5 passes were made using 25 g needles into the nodule within the right mid lobe of the thyroid.   Using direct ultrasound guidance, 5 passes were made using 25 g needles into the nodule within the right inferior lobe of the thyroid.   Ultrasound was used to confirm needle placements on all occasions.   EBL = trace  Specimens were sent to Pathology for analysis.  See procedure note under Imaging tab in Epic for full procedure details.  Calie Buttrey S Trichelle Lehan PA-C 09/21/2019 8:53 AM

## 2019-09-23 LAB — CYTOLOGY - NON PAP

## 2019-09-26 ENCOUNTER — Ambulatory Visit: Payer: 59 | Admitting: Family Medicine

## 2019-10-04 ENCOUNTER — Encounter: Payer: Self-pay | Admitting: Internal Medicine

## 2019-10-11 ENCOUNTER — Ambulatory Visit (INDEPENDENT_AMBULATORY_CARE_PROVIDER_SITE_OTHER): Payer: 59 | Admitting: Obstetrics & Gynecology

## 2019-10-11 ENCOUNTER — Other Ambulatory Visit: Payer: Self-pay

## 2019-10-11 ENCOUNTER — Other Ambulatory Visit (HOSPITAL_COMMUNITY)
Admission: RE | Admit: 2019-10-11 | Discharge: 2019-10-11 | Disposition: A | Discharge status: Home / Self Care | Payer: 59 | Source: Ambulatory Visit | Attending: Obstetrics & Gynecology | Admitting: Obstetrics & Gynecology

## 2019-10-11 ENCOUNTER — Encounter: Payer: Self-pay | Admitting: Obstetrics & Gynecology

## 2019-10-11 VITALS — BP 142/89 | HR 72 | Wt 187.0 lb

## 2019-10-11 DIAGNOSIS — Z01419 Encounter for gynecological examination (general) (routine) without abnormal findings: Secondary | ICD-10-CM | POA: Insufficient documentation

## 2019-10-11 DIAGNOSIS — D649 Anemia, unspecified: Secondary | ICD-10-CM | POA: Diagnosis not present

## 2019-10-11 DIAGNOSIS — E559 Vitamin D deficiency, unspecified: Secondary | ICD-10-CM

## 2019-10-11 DIAGNOSIS — B373 Candidiasis of vulva and vagina: Secondary | ICD-10-CM | POA: Diagnosis not present

## 2019-10-11 DIAGNOSIS — Z3042 Encounter for surveillance of injectable contraceptive: Secondary | ICD-10-CM | POA: Diagnosis not present

## 2019-10-11 DIAGNOSIS — B3731 Acute candidiasis of vulva and vagina: Secondary | ICD-10-CM

## 2019-10-11 NOTE — Patient Instructions (Signed)
Preventive Care 21-39 Years Old, Female Preventive care refers to visits with your health care provider and lifestyle choices that can promote health and wellness. This includes:  A yearly physical exam. This may also be called an annual well check.  Regular dental visits and eye exams.  Immunizations.  Screening for certain conditions.  Healthy lifestyle choices, such as eating a healthy diet, getting regular exercise, not using drugs or products that contain nicotine and tobacco, and limiting alcohol use. What can I expect for my preventive care visit? Physical exam Your health care provider will check your:  Height and weight. This may be used to calculate body mass index (BMI), which tells if you are at a healthy weight.  Heart rate and blood pressure.  Skin for abnormal spots. Counseling Your health care provider may ask you questions about your:  Alcohol, tobacco, and drug use.  Emotional well-being.  Home and relationship well-being.  Sexual activity.  Eating habits.  Work and work environment.  Method of birth control.  Menstrual cycle.  Pregnancy history. What immunizations do I need?  Influenza (flu) vaccine  This is recommended every year. Tetanus, diphtheria, and pertussis (Tdap) vaccine  You may need a Td booster every 10 years. Varicella (chickenpox) vaccine  You may need this if you have not been vaccinated. Human papillomavirus (HPV) vaccine  If recommended by your health care provider, you may need three doses over 6 months. Measles, mumps, and rubella (MMR) vaccine  You may need at least one dose of MMR. You may also need a second dose. Meningococcal conjugate (MenACWY) vaccine  One dose is recommended if you are age 19-21 years and a first-year college student living in a residence hall, or if you have one of several medical conditions. You may also need additional booster doses. Pneumococcal conjugate (PCV13) vaccine  You may need  this if you have certain conditions and were not previously vaccinated. Pneumococcal polysaccharide (PPSV23) vaccine  You may need one or two doses if you smoke cigarettes or if you have certain conditions. Hepatitis A vaccine  You may need this if you have certain conditions or if you travel or work in places where you may be exposed to hepatitis A. Hepatitis B vaccine  You may need this if you have certain conditions or if you travel or work in places where you may be exposed to hepatitis B. Haemophilus influenzae type b (Hib) vaccine  You may need this if you have certain conditions. You may receive vaccines as individual doses or as more than one vaccine together in one shot (combination vaccines). Talk with your health care provider about the risks and benefits of combination vaccines. What tests do I need?  Blood tests  Lipid and cholesterol levels. These may be checked every 5 years starting at age 20.  Hepatitis C test.  Hepatitis B test. Screening  Diabetes screening. This is done by checking your blood sugar (glucose) after you have not eaten for a while (fasting).  Sexually transmitted disease (STD) testing.  BRCA-related cancer screening. This may be done if you have a family history of breast, ovarian, tubal, or peritoneal cancers.  Pelvic exam and Pap test. This may be done every 3 years starting at age 21. Starting at age 30, this may be done every 5 years if you have a Pap test in combination with an HPV test. Talk with your health care provider about your test results, treatment options, and if necessary, the need for more tests.   Follow these instructions at home: Eating and drinking   Eat a diet that includes fresh fruits and vegetables, whole grains, lean protein, and low-fat dairy.  Take vitamin and mineral supplements as recommended by your health care provider.  Do not drink alcohol if: ? Your health care provider tells you not to drink. ? You are  pregnant, may be pregnant, or are planning to become pregnant.  If you drink alcohol: ? Limit how much you have to 0-1 drink a day. ? Be aware of how much alcohol is in your drink. In the [place] one drink equals one 12 oz bottle of beer (355 mL), one 5 oz glass of wine (148 mL), or one 1 oz glass of hard liquor (44 mL). Lifestyle  Take daily care of your teeth and gums.  Stay active. Exercise for at least 30 minutes on 5 or more days each week.  Do not use any products that contain nicotine or tobacco, such as cigarettes, e-cigarettes, and chewing tobacco. If you need help quitting, ask your health care provider.  If you are sexually active, practice safe sex. Use a condom or other form of birth control (contraception) in order to prevent pregnancy and STIs (sexually transmitted infections). If you plan to become pregnant, see your health care provider for a preconception visit. What's next?  Visit your health care provider once a year for a well check visit.  Ask your health care provider how often you should have your eyes and teeth checked.  Stay up to date on all vaccines. This information is not intended to replace advice given to you by your health care provider. Make sure you discuss any questions you have with your health care provider. Document Revised: 05/13/2018 Document Reviewed: 05/13/2018 Elsevier Patient Education  2020 [employer].

## 2019-10-11 NOTE — Progress Notes (Signed)
GYNECOLOGY ANNUAL PREVENTATIVE CARE ENCOUNTER NOTE  History:     Heidi Woods is a 39 y.o. [id number] female here for a routine annual gynecologic exam.  Current complaints: None.   Denies abnormal vaginal bleeding, discharge, pelvic pain, problems with intercourse or other gynecologic concerns.    Gynecologic History Patient's last menstrual period was 10/05/2019. Contraception: Depo-Provera injections Last Pap: 02/02/2018. Results were: normal with negative HPV   Obstetric History OB History  Gravida Para Term Preterm AB Living  13 4 4  0 9 2  SAB TAB Ectopic Multiple Live Births  2 7 0 0 2    # Outcome Date GA Lbr Len/2nd Weight Sex Delivery Anes PTL Lv  13 Term 07/22/16 [redacted]w[redacted]d 07:44 / 00:22 6 lb 11.2 oz (3.04 kg) M Vag-Spont None  LIV  12 Term 09/24/13 [redacted]w[redacted]d 06:00 / 02:34 8 lb 9.6 oz (3.9 kg) M Vag-Spont EPI  LIV  11 Term 2007   6 lb 2 oz (2.778 kg) M Vag-Spont     10 Term 2004   7 lb 6 oz (3.345 kg) F Vag-Spont     9 TAB           8 TAB           7 TAB           6 TAB           5 TAB           4 TAB           3 TAB           2 SAB           1 SAB             Past Medical History:  Diagnosis Date  . Anemia   . 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 Heterozygotes are clinically asymptomatic with no hematologic abnormalities, while those homozygous for HbO-Arab may have a mild, asymptomatic, compensated hemolytic anemia.  Hemoglobin O-Arab is of clinical   . LGSIL (low grade squamous intraepithelial lesion) on Pap smear 04/05/2013   Also had abnormal pap smear at age 49-22, had normal colpo and paps until 2014    Past Surgical History:  Procedure Laterality Date  .  CHOLECYSTECTOMY  2012  . INDUCED ABORTION     x 7  . umbbilical hernia repair      Current Outpatient Medications on File Prior to Visit  Medication Sig Dispense Refill  . medroxyPROGESTERone (DEPO-PROVERA) 150 MG/ML injection INJECT 1 ML (150 MG TOTAL) INTO THE MUSCLE EVERY 3 (THREE) MONTHS. 1 mL 3  . ibuprofen (ADVIL,MOTRIN) 600 MG tablet Take 1 tablet (600 mg total) by mouth every 6 (six) hours as needed. (Patient not taking: Reported on 10/11/2019) 30 tablet 0   Current Facility-Administered Medications on File Prior to Visit  Medication Dose Route Frequency Provider Last Rate Last Admin  . medroxyPROGESTERone (DEPO-PROVERA) injection 150 mg  150 mg Intramuscular Q90 days Donnamae Jude, MD   150 mg at 05/12/19 1354    No Known Allergies  Social History:  reports that she has never smoked. She has never used smokeless tobacco. She reports that she does  not drink alcohol or use drugs.  Family History  Problem Relation Age of Onset  . Sickle cell anemia Father   . Cancer Father        prostate  . Cancer Maternal Grandfather        prostate  . Hypertension Maternal Grandfather   . Cancer Paternal Grandfather        throat, lung  . Hypertension Maternal Grandmother   . Cancer Maternal Grandmother        colon  . Throat cancer Maternal Uncle     The following portions of the patient's history were reviewed and updated as appropriate: allergies, current medications, past family history, past medical history, past social history, past surgical history and problem list.  Review of Systems Pertinent items noted in HPI and remainder of comprehensive ROS otherwise negative.  Physical Exam:  BP (!) 142/89   Pulse 72   Wt 187 lb (84.8 kg)   LMP 10/05/2019   BMI 28.02 kg/m  CONSTITUTIONAL: Well-developed, well-nourished female in no acute distress.  HENT:  Normocephalic, atraumatic, External right and left ear normal. Oropharynx is clear and moist EYES: Conjunctivae and EOM are  normal. Pupils are equal, round, and reactive to light. No scleral icterus.  NECK: Normal range of motion, supple, no masses.  Normal thyroid.  SKIN: Skin is warm and dry. No rash noted. Not diaphoretic. No erythema. No pallor. MUSCULOSKELETAL: Normal range of motion. No tenderness.  No cyanosis, clubbing, or edema.  2+ distal pulses. NEUROLOGIC: Alert and oriented to person, place, and time. Normal reflexes, muscle tone coordination.  PSYCHIATRIC: Normal mood and affect. Normal behavior. Normal judgment and thought content. CARDIOVASCULAR: Normal heart rate noted, regular rhythm RESPIRATORY: Clear to auscultation bilaterally. Effort and breath sounds normal, no problems with respiration noted. BREASTS: Symmetric in size. No masses, tenderness, skin changes, nipple drainage, or lymphadenopathy bilaterally. ABDOMEN: Soft, no distention noted.  No tenderness, rebound or guarding.  PELVIC: Normal appearing external genitalia and urethral meatus; normal appearing vaginal mucosa and cervix.  No abnormal discharge noted.  Pap smear obtained.  Normal uterine size, no other palpable masses, no uterine or adnexal tenderness.   Assessment and Plan:    1. Surveillance for Depo-Provera contraception Will get next dose in a couple of days. Doing well, no concerns.  2. Well woman exam with routine gynecological exam Preventative healthcare maintenance labs ordered, will follow up results and manage accordingly. - Cytology - PAP( [place]) - CBC; Future - Hemoglobin A1c; Future - Comprehensive metabolic panel; Future - Lipid panel; Future - VITAMIN D 25 Hydroxy (Vit-D Deficiency, Fractures); Future - Hepatitis B surface antigen; Future - Hepatitis C antibody; Future - HIV Antibody (routine testing w rflx); Future - RPR; Future - Cervicovaginal ancillary only( [place])  3. History of anemia History of anemia, needs surveillance lab evaluation. Had low B12 level a few years ago, not followed up.   - CBC; Future - Ferritin; Future - B12; Future - Folate; Future  Elevated isolated BP noted today, will continue to monitor. Routine preventative health maintenance measures emphasized. Please refer to After Visit Summary for other counseling recommendations.   Return in 2 days (on 10/13/2019) for Lab appointment (fasting health maintenance labs, already ordered) and RN Depo Provera injection.     Jaynie Collins, MD, FACOG Obstetrician & Gynecologist, [facility] for [employer], [facility]

## 2019-10-12 LAB — CYTOLOGY - PAP
Comment: NEGATIVE
Diagnosis: NEGATIVE
High risk HPV: NEGATIVE

## 2019-10-13 ENCOUNTER — Ambulatory Visit: Payer: 59

## 2019-10-13 ENCOUNTER — Ambulatory Visit (INDEPENDENT_AMBULATORY_CARE_PROVIDER_SITE_OTHER): Payer: 59

## 2019-10-13 ENCOUNTER — Other Ambulatory Visit: Payer: Self-pay

## 2019-10-13 ENCOUNTER — Other Ambulatory Visit: Payer: 59

## 2019-10-13 DIAGNOSIS — D649 Anemia, unspecified: Secondary | ICD-10-CM

## 2019-10-13 DIAGNOSIS — Z3042 Encounter for surveillance of injectable contraceptive: Secondary | ICD-10-CM

## 2019-10-13 DIAGNOSIS — Z01419 Encounter for gynecological examination (general) (routine) without abnormal findings: Secondary | ICD-10-CM

## 2019-10-13 LAB — CERVICOVAGINAL ANCILLARY ONLY
Bacterial Vaginitis (gardnerella): NEGATIVE
Candida Glabrata: NEGATIVE
Candida Vaginitis: POSITIVE — AB
Chlamydia: NEGATIVE
Comment: NEGATIVE
Comment: NEGATIVE
Comment: NEGATIVE
Comment: NEGATIVE
Comment: NEGATIVE
Comment: NORMAL
Neisseria Gonorrhea: NEGATIVE
Trichomonas: NEGATIVE

## 2019-10-13 NOTE — Progress Notes (Signed)
Patient presented to the office today for her depo-provera injection. Given by: D.Cheree Ditto  Dose: 150 mg  Site: RUQ Next injection: 04/15-04/29

## 2019-10-14 LAB — CBC
Hematocrit: 30.8 % — ABNORMAL LOW (ref 34.0–46.6)
Hemoglobin: 10.5 g/dL — ABNORMAL LOW (ref 11.1–15.9)
MCH: 26.4 pg — ABNORMAL LOW (ref 26.6–33.0)
MCHC: 34.1 g/dL (ref 31.5–35.7)
MCV: 78 fL — ABNORMAL LOW (ref 79–97)
Platelets: 194 10*3/uL (ref 150–450)
RBC: 3.97 x10E6/uL (ref 3.77–5.28)
RDW: 15.6 % — ABNORMAL HIGH (ref 11.7–15.4)
WBC: 4.9 10*3/uL (ref 3.4–10.8)

## 2019-10-14 LAB — COMPREHENSIVE METABOLIC PANEL
ALT: 6 IU/L (ref 0–32)
AST: 13 IU/L (ref 0–40)
Albumin/Globulin Ratio: 1.4 (ref 1.2–2.2)
Albumin: 4.2 g/dL (ref 3.8–4.8)
Alkaline Phosphatase: 74 IU/L (ref 39–117)
BUN/Creatinine Ratio: 10 (ref 9–23)
BUN: 8 mg/dL (ref 6–20)
Bilirubin Total: 0.7 mg/dL (ref 0.0–1.2)
CO2: 22 mmol/L (ref 20–29)
Calcium: 9.1 mg/dL (ref 8.7–10.2)
Chloride: 107 mmol/L — ABNORMAL HIGH (ref 96–106)
Creatinine, Ser: 0.79 mg/dL (ref 0.57–1.00)
GFR calc Af Amer: 109 mL/min/{1.73_m2} (ref >59)
GFR calc non Af Amer: 95 mL/min/{1.73_m2} (ref >59)
Globulin, Total: 3.1 g/dL (ref 1.5–4.5)
Glucose: 88 mg/dL (ref 65–99)
Potassium: 3.6 mmol/L (ref 3.5–5.2)
Sodium: 141 mmol/L (ref 134–144)
Total Protein: 7.3 g/dL (ref 6.0–8.5)

## 2019-10-14 LAB — HEMOGLOBIN A1C
Est. average glucose Bld gHb Est-mCnc: 82 mg/dL
Hgb A1c MFr Bld: 4.5 % — ABNORMAL LOW (ref 4.8–5.6)

## 2019-10-14 LAB — LIPID PANEL
Chol/HDL Ratio: 3.3 ratio (ref 0.0–4.4)
Cholesterol, Total: 117 mg/dL (ref 100–199)
HDL: 36 mg/dL — ABNORMAL LOW (ref >39)
LDL Chol Calc (NIH): 70 mg/dL (ref 0–99)
Triglycerides: 48 mg/dL (ref 0–149)
VLDL Cholesterol Cal: 11 mg/dL (ref 5–40)

## 2019-10-14 LAB — RPR: RPR Ser Ql: NONREACTIVE

## 2019-10-14 LAB — HIV ANTIBODY (ROUTINE TESTING W REFLEX): HIV Screen 4th Generation wRfx: NONREACTIVE

## 2019-10-14 LAB — FERRITIN: Ferritin: 32 ng/mL (ref 15–150)

## 2019-10-14 LAB — VITAMIN D 25 HYDROXY (VIT D DEFICIENCY, FRACTURES): Vit D, 25-Hydroxy: 11.3 ng/mL — ABNORMAL LOW (ref 30.0–100.0)

## 2019-10-14 LAB — HEPATITIS C ANTIBODY: Hep C Virus Ab: <0.1 s/co ratio (ref 0.0–0.9)

## 2019-10-14 LAB — FOLATE: Folate: 3.9 ng/mL (ref >3.0)

## 2019-10-14 LAB — HEPATITIS B SURFACE ANTIGEN: Hepatitis B Surface Ag: NEGATIVE

## 2019-10-14 LAB — VITAMIN B12: Vitamin B-12: 244 pg/mL (ref 232–1245)

## 2019-10-16 MED ORDER — FLUCONAZOLE 150 MG PO TABS: 150.0000 mg | ORAL_TABLET | Freq: Once | ORAL | 3 refills | Status: DC

## 2019-10-16 MED ORDER — CHOLECALCIFEROL 1.25 MG (50000 UT) PO TABS: 1.0000 | ORAL_TABLET | ORAL | 1 refills | Status: DC

## 2019-10-16 NOTE — Addendum Note (Signed)
Addended by: Jaynie Collins A on: 10/16/2019 11:25 AM   Modules accepted: Orders

## 2019-10-16 NOTE — Addendum Note (Signed)
Addended by: Jaynie Collins A on: 10/16/2019 11:48 AM   Modules accepted: Orders

## 2019-12-27 ENCOUNTER — Other Ambulatory Visit: Payer: Self-pay | Admitting: Obstetrics & Gynecology

## 2019-12-27 DIAGNOSIS — Z3042 Encounter for surveillance of injectable contraceptive: Secondary | ICD-10-CM

## 2019-12-29 ENCOUNTER — Ambulatory Visit (INDEPENDENT_AMBULATORY_CARE_PROVIDER_SITE_OTHER): Payer: 59 | Admitting: *Deleted

## 2019-12-29 ENCOUNTER — Other Ambulatory Visit: Payer: Self-pay

## 2019-12-29 VITALS — BP 123/81 | HR 99

## 2019-12-29 DIAGNOSIS — Z3042 Encounter for surveillance of injectable contraceptive: Secondary | ICD-10-CM

## 2019-12-29 MED ORDER — MEDROXYPROGESTERONE ACETATE 150 MG/ML IM SUSP
150.0000 mg | Freq: Once | INTRAMUSCULAR | Status: AC
  Administered 2019-12-29: 150 mg via INTRAMUSCULAR

## 2019-12-29 NOTE — Progress Notes (Signed)
Date last pap: 10/11/2019. Last Depo-Provera: 10/13/2019. Side Effects if any: none. Serum HCG indicated? NA. Depo-Provera 150 mg IM given by: Scheryl Marten, RN. Next appointment due July-15th.

## 2019-12-30 NOTE — Progress Notes (Signed)
Patient seen and assessed by nursing staff during this encounter. I have reviewed the chart and agree with the documentation and plan.  Finn Amos, MD 12/30/2019 8:12 AM    

## 2020-03-15 ENCOUNTER — Other Ambulatory Visit: Payer: Self-pay | Admitting: Obstetrics & Gynecology

## 2020-03-15 DIAGNOSIS — E559 Vitamin D deficiency, unspecified: Secondary | ICD-10-CM

## 2020-03-26 ENCOUNTER — Ambulatory Visit (INDEPENDENT_AMBULATORY_CARE_PROVIDER_SITE_OTHER): Payer: 59

## 2020-03-26 ENCOUNTER — Other Ambulatory Visit: Payer: Self-pay

## 2020-03-26 VITALS — BP 111/72 | HR 83

## 2020-03-26 DIAGNOSIS — Z3042 Encounter for surveillance of injectable contraceptive: Secondary | ICD-10-CM | POA: Diagnosis not present

## 2020-03-26 NOTE — Progress Notes (Signed)
Patient presented to the office for her depo-provera injection 150 mg IM Last depo-provera: 12/29/2019 Given by: D.Cheree Ditto Serum HCG: negative  NDC# [address]-8 Side effects: none  Next Appointment: Sept 27

## 2020-03-26 NOTE — Progress Notes (Signed)
Patient was assessed and managed by nursing staff during this encounter. I have reviewed the chart and agree with the documentation and plan. I have also made any necessary editorial changes.  Jaynie Collins, MD 03/26/2020 1:56 PM

## 2020-04-09 ENCOUNTER — Other Ambulatory Visit: Payer: Self-pay | Admitting: Obstetrics & Gynecology

## 2020-04-09 DIAGNOSIS — B3731 Acute candidiasis of vulva and vagina: Secondary | ICD-10-CM

## 2020-06-11 ENCOUNTER — Ambulatory Visit (INDEPENDENT_AMBULATORY_CARE_PROVIDER_SITE_OTHER): Payer: 59

## 2020-06-11 ENCOUNTER — Other Ambulatory Visit: Payer: Self-pay

## 2020-06-11 VITALS — BP 114/76 | HR 91 | Ht 68.0 in | Wt 194.0 lb

## 2020-06-11 DIAGNOSIS — Z3042 Encounter for surveillance of injectable contraceptive: Secondary | ICD-10-CM | POA: Diagnosis not present

## 2020-06-11 MED ORDER — MEDROXYPROGESTERONE ACETATE 150 MG/ML IM SUSP
150.0000 mg | Freq: Once | INTRAMUSCULAR | Status: AC
  Administered 2020-06-11: 150 mg via INTRAMUSCULAR

## 2020-06-11 NOTE — Progress Notes (Signed)
..  Date last pap: 10/11/2019 Last Depo-Provera: 03/26/2020 Side Effects if any: N/A Serum HCG indicated? N/A Depo-Provera 150 mg IM given by: Philemon Kingdom, CMA Next appointment due: Dec. 13th- Dec. 27th

## 2020-06-12 NOTE — Progress Notes (Signed)
Patient was assessed and managed by nursing staff during this encounter. I have reviewed the chart and agree with the documentation and plan. I have also made any necessary editorial changes.  Jaynie Collins, MD 06/12/2020 7:56 AM

## 2020-06-21 ENCOUNTER — Ambulatory Visit: Payer: 59

## 2020-06-22 ENCOUNTER — Other Ambulatory Visit (HOSPITAL_COMMUNITY)
Admission: RE | Admit: 2020-06-22 | Discharge: 2020-06-22 | Disposition: A | Discharge status: Home / Self Care | Payer: 59 | Source: Ambulatory Visit | Attending: Physician Assistant | Admitting: Physician Assistant

## 2020-06-22 ENCOUNTER — Ambulatory Visit (INDEPENDENT_AMBULATORY_CARE_PROVIDER_SITE_OTHER): Payer: 59 | Admitting: *Deleted

## 2020-06-22 ENCOUNTER — Other Ambulatory Visit: Payer: Self-pay

## 2020-06-22 VITALS — BP 118/78 | HR 98

## 2020-06-22 DIAGNOSIS — N898 Other specified noninflammatory disorders of vagina: Secondary | ICD-10-CM

## 2020-06-22 DIAGNOSIS — A5901 Trichomonal vulvovaginitis: Secondary | ICD-10-CM

## 2020-06-22 DIAGNOSIS — Z113 Encounter for screening for infections with a predominantly sexual mode of transmission: Secondary | ICD-10-CM

## 2020-06-22 HISTORY — DX: Trichomonal vulvovaginitis: A59.01

## 2020-06-22 NOTE — Progress Notes (Signed)
SUBJECTIVE:  39 y.o. female complains of malodorous vaginal discharge for few day(s). Denies abnormal vaginal bleeding or significant pelvic pain or fever. No UTI symptoms. Denies history of known exposure to STD.  No LMP recorded. Patient has had an injection.  OBJECTIVE:  She appears well, afebrile. Urine dipstick: not done.  ASSESSMENT:  Vaginal Discharge  Vaginal Odor   PLAN:  GC, chlamydia, trichomonas, BVAG, CVAG probe sent to lab. Treatment: To be determined once lab results are received ROV prn if symptoms persist or worsen.

## 2020-06-22 NOTE — Progress Notes (Signed)
Patient was assessed and managed by nursing staff during this encounter. I have reviewed the chart and agree with the documentation and plan. I have also made any necessary editorial changes.  Jaynie Collins, MD 06/22/2020 11:50 AM

## 2020-06-24 ENCOUNTER — Encounter: Payer: Self-pay | Admitting: Obstetrics & Gynecology

## 2020-06-24 ENCOUNTER — Other Ambulatory Visit: Payer: Self-pay | Admitting: Obstetrics & Gynecology

## 2020-06-24 DIAGNOSIS — A5901 Trichomonal vulvovaginitis: Secondary | ICD-10-CM

## 2020-06-24 LAB — CERVICOVAGINAL ANCILLARY ONLY
Bacterial Vaginitis (gardnerella): NEGATIVE
Candida Glabrata: NEGATIVE
Candida Vaginitis: NEGATIVE
Chlamydia: NEGATIVE
Comment: NEGATIVE
Comment: NEGATIVE
Comment: NEGATIVE
Comment: NEGATIVE
Comment: NEGATIVE
Comment: NORMAL
Neisseria Gonorrhea: NEGATIVE
Trichomonas: POSITIVE — AB

## 2020-06-24 MED ORDER — METRONIDAZOLE 500 MG PO TABS: 500.0000 mg | ORAL_TABLET | Freq: Two times a day (BID) | ORAL | 1 refills | Status: AC

## 2020-06-24 NOTE — Progress Notes (Signed)
Vaginal discharge test is abnormal and showed trichomonal vaginitis; this is a sexually transmitted infection.   Recommend testing for other STIs (negative gonorrhea and chlamydia), also needs to let partner(s) know so the partner(s) can get testing and treatment. Patient and sex partner(s) should abstain from unprotected sexual activity for seven days after everyone receives appropriate treatment.  Metronidazole was prescribed for patient.  Patient can return in about 4 weeks after treatment for repeat test of cure.  Please call to inform patient of results and recommendations, and advise to pick up prescription. Please advise patient to practice safe sex at all times.   Jaynie Collins, MD

## 2020-06-25 ENCOUNTER — Telehealth: Payer: Self-pay | Admitting: *Deleted

## 2020-06-25 NOTE — Telephone Encounter (Signed)
Called pt make sure she saw her results. Pt was aware. Pt advised to pick up medication and to inform partner so they can be tested and treated as well. Advised to refrain form any unprotected sex till a tleast 7-10 after both complete treatment.Pt made appt for 4 weeks for TOC.

## 2020-06-25 NOTE — Telephone Encounter (Signed)
-----   Message from Tereso Newcomer, MD sent at 06/24/2020  9:14 PM EDT ----- Vaginal discharge test is abnormal and showed trichomonal vaginitis; this is a sexually transmitted infection.   Recommend testing for other STIs (negative gonorrhea and chlamydia), also needs to let partner(s) know so the partner(s) can get testing and treatment. Patient and sex partner(s) should abstain from unprotected sexual activity for seven days after everyone receives appropriate treatment.  Metronidazole was prescribed for patient.  Patient can return in about 4 weeks after treatment for repeat test of cure.  Please call to inform patient of results and recommendations, and advise to pick up prescription. Please advise patient to practice safe sex at all times.   Jaynie Collins, MD

## 2020-07-30 ENCOUNTER — Other Ambulatory Visit: Payer: Self-pay

## 2020-07-30 ENCOUNTER — Other Ambulatory Visit (HOSPITAL_COMMUNITY)
Admission: RE | Admit: 2020-07-30 | Discharge: 2020-07-30 | Disposition: A | Discharge status: Home / Self Care | Payer: 59 | Source: Ambulatory Visit | Attending: Obstetrics & Gynecology | Admitting: Obstetrics & Gynecology

## 2020-07-30 ENCOUNTER — Ambulatory Visit (INDEPENDENT_AMBULATORY_CARE_PROVIDER_SITE_OTHER): Payer: 59

## 2020-07-30 VITALS — BP 134/84 | HR 94

## 2020-07-30 DIAGNOSIS — A5901 Trichomonal vulvovaginitis: Secondary | ICD-10-CM | POA: Diagnosis not present

## 2020-07-30 NOTE — Progress Notes (Signed)
Pt presents for a TOC. TOC completed. Pt request all STI's to be ran on aptima swab.

## 2020-07-30 NOTE — Progress Notes (Signed)
Patient was assessed and managed by nursing staff during this encounter. I have reviewed the chart and agree with the documentation and plan. I have also made any necessary editorial changes.  Jaynie Collins, MD 07/30/2020 8:54 AM

## 2020-07-31 LAB — CERVICOVAGINAL ANCILLARY ONLY
Chlamydia: NEGATIVE
Comment: NEGATIVE
Comment: NEGATIVE
Comment: NORMAL
Neisseria Gonorrhea: NEGATIVE
Trichomonas: NEGATIVE

## 2020-08-27 ENCOUNTER — Ambulatory Visit: Payer: 59

## 2020-09-17 ENCOUNTER — Other Ambulatory Visit: Payer: 59

## 2020-10-16 ENCOUNTER — Other Ambulatory Visit: Payer: Self-pay

## 2020-10-16 ENCOUNTER — Ambulatory Visit (INDEPENDENT_AMBULATORY_CARE_PROVIDER_SITE_OTHER): Payer: BLUE CROSS/BLUE SHIELD | Admitting: Internal Medicine

## 2020-10-16 ENCOUNTER — Encounter: Payer: Self-pay | Admitting: Internal Medicine

## 2020-10-16 VITALS — BP 130/80 | HR 99 | Temp 97.9°F | Wt 196.0 lb

## 2020-10-16 DIAGNOSIS — E049 Nontoxic goiter, unspecified: Secondary | ICD-10-CM

## 2020-10-16 DIAGNOSIS — E041 Nontoxic single thyroid nodule: Secondary | ICD-10-CM | POA: Diagnosis not present

## 2020-10-16 NOTE — Progress Notes (Signed)
Subjective:    Patient ID: Heidi Woods, female    DOB: [birth year], 40 y.o.   MRN: [id number]  HPI  Pt presents to the clinic today for follow up on her goiter. She feels like this is bigger than before. She is not having any issues swallowing. Her last thyroid ultrasound from 09/2019 reviewed.  Review of Systems    Past Medical History:  Diagnosis Date  . Anemia   . 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 Heterozygotes are clinically asymptomatic with no hematologic abnormalities, while those homozygous for HbO-Arab may have a mild, asymptomatic, compensated hemolytic anemia.  Hemoglobin O-Arab is of clinical   . LGSIL (low grade squamous intraepithelial lesion) on Pap smear 04/05/2013   Also had abnormal pap smear at age 27-22, had normal colpo and paps until 2014  . Trichomonal vaginitis 06/22/2020    Current Outpatient Medications  Medication Sig Dispense Refill  . Cholecalciferol (VITAMIN D3) 1.25 MG (50000 UT) CAPS TAKE 1 CAPSULE BY MOUTH ONCE A WEEK 12 capsule 1  . medroxyPROGESTERone (DEPO-PROVERA) 150 MG/ML injection INJECT 1 ML (150 MG TOTAL) INTO THE MUSCLE EVERY 3 (THREE) MONTHS. 1 mL 3   Current Facility-Administered Medications  Medication Dose Route Frequency Provider Last Rate Last Admin  . medroxyPROGESTERone (DEPO-PROVERA) injection 150 mg  150 mg Intramuscular Q90 days Reva Bores, MD   150 mg at 03/26/20 1510    No Known Allergies  Family History  Problem Relation Age of Onset  . Sickle cell anemia Father   . Cancer Father        prostate  . Cancer Maternal Grandfather        prostate  . Hypertension Maternal Grandfather   . Cancer Paternal Grandfather         throat, lung  . Hypertension Maternal Grandmother   . Cancer Maternal Grandmother        colon  . Throat cancer Maternal Uncle     Social History   Socioeconomic History  . Marital status: Single    Spouse name: Not on file  . Number of children: Not on file  . Years of education: Not on file  . Highest education level: Not on file  Occupational History  . Not on file  Tobacco Use  . Smoking status: Never Smoker  . Smokeless tobacco: Never Used  Substance and Sexual Activity  . Alcohol use: No  . Drug use: No  . Sexual activity: Yes    Partners: Male    Birth control/protection: Condom  Other Topics Concern  . Not on file  Social History Narrative  . Not on file   Social Determinants of Health   Financial Resource Strain: Not on file  Food Insecurity: Not on file  Transportation Needs: Not on file  Physical Activity: Not on file  Stress: Not on file  Social Connections: Not on file  Intimate Partner Violence: Not on file     Constitutional: Denies fever, malaise, fatigue, headache or abrupt weight changes.  Respiratory: Denies difficulty breathing, shortness of breath, cough or sputum production.   Cardiovascular: Denies chest pain, chest tightness, palpitations or swelling in the hands or feet.   No other specific complaints in a  complete review of systems (except as listed in HPI above).     Objective:   Physical Exam  BP 130/80   Pulse 99   Temp 97.9 F (36.6 C) (Temporal)   Wt 196 lb (88.9 kg)   SpO2 98%   BMI 29.80 kg/m   Wt Readings from Last 3 Encounters:  06/11/20 194 lb (88 kg)  10/11/19 187 lb (84.8 kg)  08/17/19 185 lb (83.9 kg)    General: Appears her stated age, well developed, well nourished in NAD. Neck:  Neck supple, trachea midline. Goiter noted.  Cardiovascular: Normal rate Pulmonary/Chest: Normal effort. Neurological: Alert and oriented.   BMET    Component Value Date/Time   NA 141 10/13/2019 0824   NA 136 04/04/2013  0834   K 3.6 10/13/2019 0824   K 3.3 (L) 04/04/2013 0834   CL 107 (H) 10/13/2019 0824   CL 107 04/04/2013 0834   CO2 22 10/13/2019 0824   CO2 23 04/04/2013 0834   GLUCOSE 88 10/13/2019 0824   GLUCOSE 104 (H) 04/04/2013 0834   BUN 8 10/13/2019 0824   BUN 11 04/04/2013 0834   CREATININE 0.79 10/13/2019 0824   CREATININE 0.66 04/04/2013 0834   CALCIUM 9.1 10/13/2019 0824   CALCIUM 8.9 04/04/2013 0834   GFRNONAA 95 10/13/2019 0824   GFRNONAA >60 04/04/2013 0834   GFRAA 109 10/13/2019 0824   GFRAA >60 04/04/2013 0834    Lipid Panel     Component Value Date/Time   CHOL 117 10/13/2019 0824   TRIG 48 10/13/2019 0824   HDL 36 (L) 10/13/2019 0824   CHOLHDL 3.3 10/13/2019 0824   LDLCALC 70 10/13/2019 0824    CBC    Component Value Date/Time   WBC 4.9 10/13/2019 0824   WBC 5.7 11/03/2016 1350   RBC 3.97 10/13/2019 0824   RBC 4.03 11/03/2016 1350   HGB 10.5 (L) 10/13/2019 0824   HCT 30.8 (L) 10/13/2019 0824   PLT 194 10/13/2019 0824   MCV 78 (L) 10/13/2019 0824   MCV 74 (L) 04/04/2013 0834   MCH 26.4 (L) 10/13/2019 0824   MCH 27.0 11/03/2016 1350   MCHC 34.1 10/13/2019 0824   MCHC 35.4 11/03/2016 1350   RDW 15.6 (H) 10/13/2019 0824   RDW 21.1 (H) 04/04/2013 0834   LYMPHSABS 2.1 11/03/2016 1350   MONOABS 0.3 11/03/2016 1350   EOSABS 0.0 11/03/2016 1350   BASOSABS 0.0 11/03/2016 1350    Hgb A1C Lab Results  Component Value Date   HGBA1C 4.5 (L) 10/13/2019          Assessment & Plan:   Goiter:  US thyroid ordered today TSH today  Will follow up after imaging, return precautions discussed Nicki Reaper, NP This visit occurred during the SARS-CoV-2 public health emergency.  Safety protocols were in place, including screening questions prior to the visit, additional usage of staff PPE, and extensive cleaning of exam room while observing appropriate contact time as indicated for disinfecting solutions.

## 2020-10-16 NOTE — Patient Instructions (Signed)
Goiter  A goiter is an enlarged thyroid gland. The thyroid is located in the lower front of the neck. It makes hormones that affect many body parts and systems, including the system that affects how quickly the body burns fuel for energy (metabolism). Most goiters are painless and are not a cause for concern. Some goiters can affect the way your thyroid makes thyroid hormones. Goiters and conditions that cause goiters can be treated, if necessary. What are the causes? Common causes of this condition include:  Lack (deficiency) of a mineral called iodine. The thyroid gland uses iodine to make thyroid hormones.  Diseases that attack healthy cells in the body (autoimmune diseases) and affect thyroid function, such as Graves' disease or Hashimoto's disease. These diseases may cause the body to produce too much thyroid hormone (hyperthyroidism) or too little of the hormone (hypothyroidism).  Conditions that cause inflammation of the thyroid (thyroiditis).  One or more small growths on the thyroid (nodular goiter). Other causes include:  Medical problems caused by abnormal genes that are passed from parent to child (genetic defects).  Thyroid injury or infection.  Tumors that may or may not be cancerous.  Pregnancy.  Certain medicines.  Exposure to radiation. In some cases, the cause may not be known. What increases the risk? This condition is more likely to develop in:  People who do not get enough iodine in their diet.  People who have a family history of goiter.  Women.  People who are older than age 40.  People who smoke tobacco.  People who have had exposure to radiation. What are the signs or symptoms? The main symptom of this condition is swelling in the lower, front part of the neck. This swelling can range from a very small bump to a large lump. Other symptoms may include:  A tight feeling in the throat.  A hoarse voice.  Coughing.  Wheezing.  Difficulty  swallowing or breathing.  Bulging veins in the neck.  Dizziness. When a goiter is the result of an overactive thyroid (hyperthyroidism), symptoms may also include:  Nervousness or restlessness.  Inability to tolerate heat.  Unexplained weight loss.  Diarrhea.  Change in the texture of hair or skin.  Changes in heartbeat, such as skipped beats, extra beats, or a rapid heart rate.  Loss of menstruation.  Shaky hands.  Increased appetite.  Sleep problems. When a goiter is the result of an underactive thyroid (hypothyroidism), symptoms may also include:  Feeling like you have no energy (lethargy).  Inability to tolerate cold.  Weight gain that is not explained by a change in diet or exercise habits.  Dry skin.  Coarse hair.  Irregular menstrual periods.  Constipation.  Sadness or depression.  Fatigue. In some cases, there may not be any symptoms and the thyroid hormone levels may be normal.   How is this diagnosed? This condition may be diagnosed based on your symptoms, your medical history, and a physical exam. You may have tests, such as:  Blood tests to check thyroid function.  Imaging tests, such as: ? Ultrasound. ? CT scan. ? MRI. ? Thyroid scan.  Removal of a tissue sample (biopsy) of the goiter or any nodules. The sample will be tested to check for cancer. How is this treated? Treatment for this condition depends on the cause and your symptoms. Treatment may include:  Medicines to regulate thyroid hormone levels.  Anti-inflammatory medicines or steroid medicines, if the goiter is caused by inflammation.  Iodine supplements or changes   to your diet, if the goiter is caused by iodine deficiency.  Radioactive iodine treatment.  Surgery to remove your thyroid. In some cases, you may only need regular check-ups with your health care provider to monitor your condition, and you may not need treatment. Follow these instructions at home:  Follow  instructions from your health care provider about any changes to your diet.  Take over-the-counter and prescription medicines only as told by your health care provider. These include supplements.  Do not use any products that contain nicotine or tobacco, such as cigarettes and e-cigarettes. If you need help quitting, ask your health care provider.  Keep all follow-up visits as told by your health care provider. This is important. Contact a health care provider if:  Your symptoms do not get better with treatment.  You have nausea, vomiting, or diarrhea. Get help right away if:  You have sudden, unexplained confusion or other mental changes.  You have a fever.  You have chest pain.  You have trouble breathing or swallowing.  You suddenly become very weak.  You experience extreme restlessness.  You feel your heart racing. Summary  A goiter is an enlarged thyroid gland.  The thyroid gland is located in the lower front of the neck. It makes hormones that affect many body parts and systems, including the system that affects how quickly the body burns fuel for energy (metabolism).  The main symptom of this condition is swelling in the lower, front part of the neck. This swelling can range from a very small bump to a large lump.  Treatment for this condition depends on the cause and your symptoms. You may need medicines, supplements, or regular monitoring of your condition. This information is not intended to replace advice given to you by your health care provider. Make sure you discuss any questions you have with your health care provider. Document Revised: 05/10/2020 Document Reviewed: 05/10/2020 Elsevier Patient Education  2021 [employer] Inc.  

## 2020-10-17 LAB — TSH: TSH: 1.23 u[IU]/mL (ref 0.35–4.50)

## 2020-10-29 ENCOUNTER — Ambulatory Visit
Admission: RE | Admit: 2020-10-29 | Discharge: 2020-10-29 | Disposition: A | Discharge status: Home / Self Care | Payer: BLUE CROSS/BLUE SHIELD | Source: Ambulatory Visit | Attending: Internal Medicine | Admitting: Internal Medicine

## 2020-10-31 ENCOUNTER — Encounter: Payer: Self-pay | Admitting: Internal Medicine

## 2020-10-31 NOTE — Addendum Note (Signed)
Addended by: Lorre Munroe on: 10/31/2020 01:13 PM   Modules accepted: Orders

## 2020-11-01 NOTE — Addendum Note (Signed)
Addended by: Consuella Lose on: 11/01/2020 10:42 AM   Modules accepted: Orders

## 2020-11-09 ENCOUNTER — Other Ambulatory Visit: Payer: BLUE CROSS/BLUE SHIELD

## 2020-11-27 ENCOUNTER — Ambulatory Visit
Admission: RE | Admit: 2020-11-27 | Discharge: 2020-11-27 | Disposition: A | Discharge status: Home / Self Care | Payer: BLUE CROSS/BLUE SHIELD | Source: Ambulatory Visit | Attending: Internal Medicine | Admitting: Internal Medicine

## 2020-11-27 ENCOUNTER — Other Ambulatory Visit (HOSPITAL_COMMUNITY)
Admission: RE | Admit: 2020-11-27 | Discharge: 2020-11-27 | Disposition: A | Discharge status: Home / Self Care | Payer: BLUE CROSS/BLUE SHIELD | Source: Ambulatory Visit | Attending: Internal Medicine | Admitting: Internal Medicine

## 2020-11-27 DIAGNOSIS — E041 Nontoxic single thyroid nodule: Secondary | ICD-10-CM | POA: Insufficient documentation

## 2020-11-29 LAB — CYTOLOGY - NON PAP

## 2021-04-20 ENCOUNTER — Other Ambulatory Visit: Payer: Self-pay | Admitting: Obstetrics & Gynecology

## 2021-04-20 DIAGNOSIS — B373 Candidiasis of vulva and vagina: Secondary | ICD-10-CM

## 2021-04-20 DIAGNOSIS — B3731 Acute candidiasis of vulva and vagina: Secondary | ICD-10-CM

## 2021-05-21 ENCOUNTER — Encounter: Payer: Self-pay | Admitting: Oncology

## 2021-05-23 ENCOUNTER — Other Ambulatory Visit (HOSPITAL_COMMUNITY)
Admission: RE | Admit: 2021-05-23 | Discharge: 2021-05-23 | Disposition: A | Discharge status: Home / Self Care | Payer: Federal, State, Local not specified - PPO | Source: Ambulatory Visit | Attending: Family Medicine | Admitting: Family Medicine

## 2021-05-23 ENCOUNTER — Encounter: Payer: Self-pay | Admitting: Oncology

## 2021-05-23 ENCOUNTER — Ambulatory Visit (INDEPENDENT_AMBULATORY_CARE_PROVIDER_SITE_OTHER): Payer: Federal, State, Local not specified - PPO | Admitting: *Deleted

## 2021-05-23 ENCOUNTER — Other Ambulatory Visit: Payer: Self-pay

## 2021-05-23 VITALS — BP 136/84 | HR 99

## 2021-05-23 DIAGNOSIS — N898 Other specified noninflammatory disorders of vagina: Secondary | ICD-10-CM

## 2021-05-23 NOTE — Progress Notes (Signed)
SUBJECTIVE:  40 y.o. female complains of yellow and odorous vaginal discharge for 2 day(s). Denies abnormal vaginal bleeding or significant pelvic pain or fever. No UTI symptoms. Denies history of known exposure to STD.   No LMP recorded.  OBJECTIVE:  She appears well, afebrile. Urine dipstick: not done.  ASSESSMENT:  Vaginal Discharge  Vaginal Odor   PLAN:  GC, chlamydia, trichomonas, BVAG, CVAG probe sent to lab. Treatment: To be determined once lab results are received ROV prn if symptoms persist or worsen.

## 2021-05-24 NOTE — Progress Notes (Signed)
Patient seen and assessed by nursing staff.  Agree with documentation and plan.  

## 2021-05-27 LAB — CERVICOVAGINAL ANCILLARY ONLY
Bacterial Vaginitis (gardnerella): POSITIVE — AB
Candida Glabrata: NEGATIVE
Candida Vaginitis: NEGATIVE
Chlamydia: NEGATIVE
Comment: NEGATIVE
Comment: NEGATIVE
Comment: NEGATIVE
Comment: NEGATIVE
Comment: NEGATIVE
Comment: NORMAL
Neisseria Gonorrhea: NEGATIVE
Trichomonas: NEGATIVE

## 2021-05-28 MED ORDER — METRONIDAZOLE 500 MG PO TABS: 500.0000 mg | ORAL_TABLET | Freq: Two times a day (BID) | ORAL | 0 refills | Status: DC

## 2021-05-28 NOTE — Addendum Note (Signed)
Addended by: Reva Bores on: 05/28/2021 08:02 AM   Modules accepted: Orders

## 2021-07-10 ENCOUNTER — Encounter: Payer: Self-pay | Admitting: Nurse Practitioner

## 2021-07-10 ENCOUNTER — Telehealth (INDEPENDENT_AMBULATORY_CARE_PROVIDER_SITE_OTHER): Payer: Federal, State, Local not specified - PPO | Admitting: Nurse Practitioner

## 2021-07-10 VITALS — Temp 97.7°F

## 2021-07-10 DIAGNOSIS — R1114 Bilious vomiting: Secondary | ICD-10-CM | POA: Diagnosis not present

## 2021-07-10 MED ORDER — FAMOTIDINE 20 MG PO TABS: 20.0000 mg | ORAL_TABLET | Freq: Every day | ORAL | 0 refills | Status: DC | PRN

## 2021-07-10 MED ORDER — ONDANSETRON 4 MG PO TBDP: 4.0000 mg | ORAL_TABLET | Freq: Three times a day (TID) | ORAL | 0 refills | Status: AC | PRN

## 2021-07-10 NOTE — Progress Notes (Signed)
Patient ID: Heidi Woods, female    DOB: March 02, 1981, 40 y.o.   MRN: [id number]  Virtual visit completed through [employer], a video enabled telemedicine application. Due to national recommendations of social distancing due to COVID-19, a virtual visit is felt to be most appropriate for this patient at this time. Reviewed limitations, risks, security and privacy concerns of performing a virtual visit and the availability of in person appointments. I also reviewed that there may be a patient responsible charge related to this service. The patient agreed to proceed.   Patient location: home Provider location: [place] at [facility], office Persons participating in this virtual visit: patient, provider   If any vitals were documented, they were collected by patient at home unless specified below.    Temp 97.7 F (36.5 C)    CC: Nausea, vomiting and diarrhea Subjective:   HPI: Heidi Woods is a 40 y.o. female presenting on 07/10/2021 for Diarrhea (At 3 am this morning, abdominal pain and vomiting. Not able to keep water done, goes straight to the bathroom. This happens every week for the last 3 weeks. Usually lasts a day. No fever. )    Symtpoms started this morning Vomiting episodes 3 times. States yellow Diarrhea about 3 episodes. No blood  Abdominal pain that is cramping all the time to lower abdom  Youngest son RSV last week  Did take otc nausea relief without any relief of symptoms  Vaccinated against covid with Pfizer x2  LMP a week ago    Relevant past medical, surgical, family and social history reviewed and updated as indicated. Interim medical history since our last visit reviewed. Allergies and medications reviewed and updated. Outpatient Medications Prior to Visit  Medication Sig Dispense Refill   Cholecalciferol (VITAMIN D3) 1.25 MG (50000 UT) CAPS TAKE 1 CAPSULE BY MOUTH ONCE A WEEK 12 capsule 1   metroNIDAZOLE (FLAGYL) 500 MG tablet Take 1 tablet (500 mg total) by  mouth 2 (two) times daily. 14 tablet 0   No facility-administered medications prior to visit.     Per HPI unless specifically indicated in ROS section below Review of Systems  Constitutional:  Negative for chills and fever.  Respiratory:  Negative for cough and shortness of breath.   Cardiovascular:  Negative for chest pain.  Gastrointestinal:  Positive for abdominal pain, diarrhea, nausea and vomiting.  Neurological:  Positive for dizziness (states immediately after vomiting then it resolves) and light-headedness (states immediately after vomiting then it resolves). Negative for headaches.  Objective:  Temp 97.7 F (36.5 C)   Wt Readings from Last 3 Encounters:  10/16/20 196 lb (88.9 kg)  06/11/20 194 lb (88 kg)  10/11/19 187 lb (84.8 kg)       Physical exam: Gen: alert, NAD, not ill appearing Pulm: speaks in complete sentences without increased work of breathing Psych: normal mood, normal thought content      Results for orders placed or performed in visit on 05/23/21  Cervicovaginal ancillary only( [place])  Result Value Ref Range   Neisseria Gonorrhea Negative    Chlamydia Negative    Trichomonas Negative    Bacterial Vaginitis (gardnerella) Positive (A)    Candida Vaginitis Negative    Candida Glabrata Negative    Comment Normal Reference Range Candida Species - Negative    Comment Normal Reference Range Candida Galbrata - Negative    Comment Normal Reference Range Trichomonas - Negative    Comment Normal Reference Ranger Chlamydia - Negative    Comment  Normal Reference Range Neisseria Gonorrhea - Negative   Comment      Normal Reference Range Bacterial Vaginosis - Negative   Assessment & Plan:   Problem List Items Addressed This Visit       Digestive   Bilious vomiting with nausea - Primary    Patient presents with 1 day of nausea and vomiting along with diarrhea.  Patient has no true sick contacts minus her youngest son who was recently sick with  RSV but seems to recovered.  Patient states this happens proximately once a week over the past 3 weeks she was curious as needed with her gallbladder being removed told patient do not think so unless she is passing a stone or having common bile duct issue that we cannot evaluated in video.  We will send an antiemetic to see if this helps.  Patient cautioned on signs and symptoms as to when to be seen in emergency department in regards to dehydration and developing belly pain.  Patient acknowledged understood. Start ondansetron 4 mg ODT every 8 hours as needed nausea and vomiting.      Relevant Medications   ondansetron (ZOFRAN-ODT) 4 MG disintegrating tablet   famotidine (PEPCID) 20 MG tablet     No orders of the defined types were placed in this encounter.  No orders of the defined types were placed in this encounter.   I discussed the assessment and treatment plan with the patient. The patient was provided an opportunity to ask questions and all were answered. The patient agreed with the plan and demonstrated an understanding of the instructions. The patient was advised to call back or seek an in-person evaluation if the symptoms worsen or if the condition fails to improve as anticipated.  Follow up plan: No follow-ups on file.  Audria Nine, NP

## 2021-07-10 NOTE — Assessment & Plan Note (Signed)
Patient presents with 1 day of nausea and vomiting along with diarrhea.  Patient has no true sick contacts minus her youngest son who was recently sick with RSV but seems to recovered.  Patient states this happens proximately once a week over the past 3 weeks she was curious as needed with her gallbladder being removed told patient do not think so unless she is passing a stone or having common bile duct issue that we cannot evaluated in video.  We will send an antiemetic to see if this helps.  Patient cautioned on signs and symptoms as to when to be seen in emergency department in regards to dehydration and developing belly pain.  Patient acknowledged understood. Start ondansetron 4 mg ODT every 8 hours as needed nausea and vomiting.

## 2021-10-15 ENCOUNTER — Encounter: Payer: Self-pay | Admitting: Oncology

## 2021-10-22 ENCOUNTER — Ambulatory Visit (INDEPENDENT_AMBULATORY_CARE_PROVIDER_SITE_OTHER): Payer: Federal, State, Local not specified - PPO | Admitting: Internal Medicine

## 2021-10-22 ENCOUNTER — Other Ambulatory Visit: Payer: Self-pay

## 2021-10-22 ENCOUNTER — Encounter: Payer: Self-pay | Admitting: Internal Medicine

## 2021-10-22 VITALS — BP 140/86 | HR 94 | Temp 97.7°F | Ht 68.8 in | Wt 196.0 lb

## 2021-10-22 DIAGNOSIS — Z0001 Encounter for general adult medical examination with abnormal findings: Secondary | ICD-10-CM | POA: Diagnosis not present

## 2021-10-22 DIAGNOSIS — E049 Nontoxic goiter, unspecified: Secondary | ICD-10-CM

## 2021-10-22 DIAGNOSIS — Z1231 Encounter for screening mammogram for malignant neoplasm of breast: Secondary | ICD-10-CM | POA: Diagnosis not present

## 2021-10-22 DIAGNOSIS — N921 Excessive and frequent menstruation with irregular cycle: Secondary | ICD-10-CM | POA: Diagnosis not present

## 2021-10-22 DIAGNOSIS — R7309 Other abnormal glucose: Secondary | ICD-10-CM | POA: Diagnosis not present

## 2021-10-22 NOTE — Patient Instructions (Signed)

## 2021-10-22 NOTE — Progress Notes (Signed)
Subjective:    Patient ID: Heidi Woods, female    DOB: [birth year], 41 y.o.   MRN: [id number]  HPI  Patient presents the clinic today for her annual exam.  Flu: 06/2013 Tetanus: 04/2016 COVID: Pfizer x 1 Pap smear: 09/2019 Mammogram: Never Vision screening: annually Dentist: biannually  Diet: She does eat meat. She consumes fruits and veggies. She does eat some fried foods. She drinks mostly water and soda. 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 Heterozygotes are clinically asymptomatic with no hematologic abnormalities, while those homozygous for HbO-Arab may have a mild, asymptomatic, compensated hemolytic anemia.  Hemoglobin O-Arab is of clinical    LGSIL (low grade squamous intraepithelial lesion) on Pap smear 04/05/2013   Also had abnormal pap smear at age 4-22, had normal colpo and paps until 2014   Trichomonal vaginitis 06/22/2020    Current Outpatient Medications  Medication Sig Dispense Refill   Cholecalciferol (VITAMIN D3) 1.25 MG (50000 UT) CAPS TAKE 1 CAPSULE BY MOUTH ONCE A WEEK 12 capsule 1   famotidine (PEPCID) 20 MG tablet Take 1 tablet (20 mg total) by mouth daily as needed for up to 10 days for heartburn or indigestion. 10 tablet 0   No current facility-administered medications for this visit.    No Known Allergies  Family History  Problem Relation Age of Onset   Sickle cell anemia Father    Cancer Father        prostate   Cancer Maternal Grandfather        prostate   Hypertension Maternal Grandfather    Cancer Paternal Grandfather        throat, lung   Hypertension  Maternal Grandmother    Cancer Maternal Grandmother        colon   Throat cancer Maternal Uncle     Social History   Socioeconomic History   Marital status: Single    Spouse name: Not on file   Number of children: Not on file   Years of education: Not on file   Highest education level: Not on file  Occupational History   Not on file  Tobacco Use   Smoking status: Never   Smokeless tobacco: Never  Substance and Sexual Activity   Alcohol use: No   Drug use: No   Sexual activity: Yes    Partners: Male    Birth control/protection: Condom  Other Topics Concern   Not on file  Social History Narrative   Not on file   Social Determinants of Health   Financial Resource Strain: Not on file  Food Insecurity: Not on file  Transportation Needs: Not on file  Physical Activity: Not on file  Stress: Not on file  Social Connections: Not on file  Intimate Partner Violence: Not on file     Constitutional: Denies fever, malaise, fatigue, headache or abrupt weight changes.  HEENT: Denies eye pain, eye redness, ear pain, ringing in the ears, wax buildup, runny nose, nasal congestion, bloody nose, or sore throat. Respiratory: Denies difficulty breathing, shortness of breath, cough or sputum production.   Cardiovascular: Denies chest pain,  chest tightness, palpitations or swelling in the hands or feet.  Gastrointestinal: Denies abdominal pain, bloating, constipation, diarrhea or blood in the stool.  GU: Patient reports irregular menses with very heavy bleeding and clotting.  Denies urgency, frequency, pain with urination, burning sensation, blood in urine, odor or discharge. Musculoskeletal: Denies decrease in range of motion, difficulty with gait, muscle pain or joint pain and swelling.  Skin: Denies redness, rashes, lesions or ulcercations.  Neurological: Denies dizziness, difficulty with memory, difficulty with speech or problems with balance and coordination.  Psych: Denies anxiety,  depression, SI/HI.  No other specific complaints in a complete review of systems (except as listed in HPI above).  Objective:   Physical Exam   BP 140/86 (BP Location: Right Arm, Patient Position: Sitting, Cuff Size: Large)    Pulse 94    Temp 97.7 F (36.5 C) (Temporal)    Ht 5' 8.8" (1.748 m)    Wt 196 lb (88.9 kg)    SpO2 100%    BMI 29.11 kg/m   Wt Readings from Last 3 Encounters:  10/16/20 196 lb (88.9 kg)  06/11/20 194 lb (88 kg)  10/11/19 187 lb (84.8 kg)    General: Appears her stated age, overweight, in NAD. Skin: Warm, dry and intact.  HEENT: Head: normal shape and size; Eyes: EOMs intact;  Neck:  Neck supple, trachea midline.  Multi nodular goiter noted Cardiovascular: Normal rate and rhythm. S1,S2 noted.  No murmur, rubs or gallops noted. No JVD or BLE edema.  Pulmonary/Chest: Normal effort and positive vesicular breath sounds. No respiratory distress. No wheezes, rales or ronchi noted.  Abdomen:  Normal bowel sounds.  Musculoskeletal: Strength 5/5 BUE/BLE no difficulty with gait.  Neurological: Alert and oriented. Cranial nerves II-XII grossly intact. Coordination normal.  Psychiatric: Mood and affect normal. Behavior is normal. Judgment and thought content normal.    BMET    Component Value Date/Time   NA 141 10/13/2019 0824   NA 136 04/04/2013 0834   K 3.6 10/13/2019 0824   K 3.3 (L) 04/04/2013 0834   CL 107 (H) 10/13/2019 0824   CL 107 04/04/2013 0834   CO2 22 10/13/2019 0824   CO2 23 04/04/2013 0834   GLUCOSE 88 10/13/2019 0824   GLUCOSE 104 (H) 04/04/2013 0834   BUN 8 10/13/2019 0824   BUN 11 04/04/2013 0834   CREATININE 0.79 10/13/2019 0824   CREATININE 0.66 04/04/2013 0834   CALCIUM 9.1 10/13/2019 0824   CALCIUM 8.9 04/04/2013 0834   GFRNONAA 95 10/13/2019 0824   GFRNONAA >60 04/04/2013 0834   GFRAA 109 10/13/2019 0824   GFRAA >60 04/04/2013 0834    Lipid Panel     Component Value Date/Time   CHOL 117 10/13/2019 0824   TRIG 48 10/13/2019  0824   HDL 36 (L) 10/13/2019 0824   CHOLHDL 3.3 10/13/2019 0824   LDLCALC 70 10/13/2019 0824    CBC    Component Value Date/Time   WBC 4.9 10/13/2019 0824   WBC 5.7 11/03/2016 1350   RBC 3.97 10/13/2019 0824   RBC 4.03 11/03/2016 1350   HGB 10.5 (L) 10/13/2019 0824   HCT 30.8 (L) 10/13/2019 0824   PLT 194 10/13/2019 0824   MCV 78 (L) 10/13/2019 0824   MCV 74 (L) 04/04/2013 0834   MCH 26.4 (L) 10/13/2019 0824   MCH 27.0 11/03/2016 1350   MCHC 34.1 10/13/2019 0824   MCHC 35.4 11/03/2016 1350   RDW 15.6 (H) 10/13/2019 0824   RDW 21.1 (H)  04/04/2013 0834   LYMPHSABS 2.1 11/03/2016 1350   MONOABS 0.3 11/03/2016 1350   EOSABS 0.0 11/03/2016 1350   BASOSABS 0.0 11/03/2016 1350    Hgb A1C Lab Results  Component Value Date   HGBA1C 4.5 (L) 10/13/2019          Assessment & Plan:   Preventative Health Maintenance:  She declines flu shot Tetanus UTD Encouraged her to get her COVID booster Pap smear UTD Mammogram ordered-she will call to schedule Encouraged her to consume a balanced diet and exercise regimen Advised her to see an eye doctor and dentist annually We will check CBC, c-Met, lipid, A1c today  Menorrhagia:  Ultrasound pelvic complete for further evaluation of symptoms  Goiter:  Ultrasound thyroid ordered  RTC in 1 year, sooner if needed Webb Silversmith, NP This visit occurred during the SARS-CoV-2 public health emergency.  Safety protocols were in place, including screening questions prior to the visit, additional usage of staff PPE, and extensive cleaning of exam room while observing appropriate contact time as indicated for disinfecting solutions.

## 2021-10-23 LAB — COMPLETE METABOLIC PANEL WITH GFR
AG Ratio: 1.2 (calc) (ref 1.0–2.5)
ALT: 5 U/L — ABNORMAL LOW (ref 6–29)
AST: 11 U/L (ref 10–30)
Albumin: 4.2 g/dL (ref 3.6–5.1)
Alkaline phosphatase (APISO): 69 U/L (ref 31–125)
BUN: 10 mg/dL (ref 7–25)
CO2: 25 mmol/L (ref 20–32)
Calcium: 9 mg/dL (ref 8.6–10.2)
Chloride: 109 mmol/L (ref 98–110)
Creat: 0.69 mg/dL (ref 0.50–0.99)
Globulin: 3.5 g/dL (calc) (ref 1.9–3.7)
Glucose, Bld: 94 mg/dL (ref 65–99)
Potassium: 3.4 mmol/L — ABNORMAL LOW (ref 3.5–5.3)
Sodium: 141 mmol/L (ref 135–146)
Total Bilirubin: 0.6 mg/dL (ref 0.2–1.2)
Total Protein: 7.7 g/dL (ref 6.1–8.1)
eGFR: 112 mL/min/{1.73_m2} (ref >=60)

## 2021-10-23 LAB — LIPID PANEL
Cholesterol: 127 mg/dL (ref <200)
HDL: 44 mg/dL — ABNORMAL LOW (ref >=50)
LDL Cholesterol (Calc): 64 mg/dL (calc)
Non-HDL Cholesterol (Calc): 83 mg/dL (calc) (ref <130)
Total CHOL/HDL Ratio: 2.9 (calc) (ref <5.0)
Triglycerides: 108 mg/dL (ref <150)

## 2021-10-23 LAB — CBC
HCT: 31.1 % — ABNORMAL LOW (ref 35.0–45.0)
Hemoglobin: 10.3 g/dL — ABNORMAL LOW (ref 11.7–15.5)
MCH: 25.2 pg — ABNORMAL LOW (ref 27.0–33.0)
MCHC: 33.1 g/dL (ref 32.0–36.0)
MCV: 76.2 fL — ABNORMAL LOW (ref 80.0–100.0)
MPV: 11.4 fL (ref 7.5–12.5)
Platelets: 213 10*3/uL (ref 140–400)
RBC: 4.08 10*6/uL (ref 3.80–5.10)
RDW: 15.9 % — ABNORMAL HIGH (ref 11.0–15.0)
WBC: 6.5 10*3/uL (ref 3.8–10.8)

## 2021-10-23 LAB — HEMOGLOBIN A1C
Hgb A1c MFr Bld: 4.5 % of total Hgb (ref <5.7)
Mean Plasma Glucose: 82 mg/dL
eAG (mmol/L): 4.6 mmol/L

## 2021-10-24 ENCOUNTER — Telehealth: Payer: Self-pay

## 2021-10-24 NOTE — Telephone Encounter (Signed)
Pt advised.   Thanks,   -Nykeem Citro  

## 2021-10-24 NOTE — Telephone Encounter (Signed)
-----   Message from Lorre Munroe, NP sent at 10/23/2021  5:37 PM EST ----- Stable anemia.  Is she taking iron OTC?  Liver and kidney function is normal.  Potassium is slightly low.  Would recommend she eat more potassium containing foods.  She does not have diabetes.  Cholesterol is reasonable however HDL is slightly low.  She can improve this by increasing aerobic exercise.

## 2021-10-24 NOTE — Telephone Encounter (Signed)
Pt advised.  She is not taking iron OTC.  Thanks,   -Vernona Rieger

## 2021-10-24 NOTE — Telephone Encounter (Signed)
Have her start taking iron OTC 325 mg daily.  Caution constipation.

## 2021-11-01 ENCOUNTER — Ambulatory Visit
Admission: RE | Admit: 2021-11-01 | Discharge: 2021-11-01 | Disposition: A | Discharge status: Home / Self Care | Payer: Federal, State, Local not specified - PPO | Source: Ambulatory Visit | Attending: Internal Medicine | Admitting: Internal Medicine

## 2021-11-01 ENCOUNTER — Other Ambulatory Visit: Payer: Self-pay | Admitting: Internal Medicine

## 2021-11-01 ENCOUNTER — Other Ambulatory Visit: Payer: Self-pay

## 2021-11-01 DIAGNOSIS — E049 Nontoxic goiter, unspecified: Secondary | ICD-10-CM | POA: Insufficient documentation

## 2021-11-01 DIAGNOSIS — N921 Excessive and frequent menstruation with irregular cycle: Secondary | ICD-10-CM

## 2021-11-01 DIAGNOSIS — D259 Leiomyoma of uterus, unspecified: Secondary | ICD-10-CM | POA: Diagnosis not present

## 2021-11-01 DIAGNOSIS — E042 Nontoxic multinodular goiter: Secondary | ICD-10-CM | POA: Diagnosis not present

## 2021-11-01 DIAGNOSIS — N83201 Unspecified ovarian cyst, right side: Secondary | ICD-10-CM | POA: Diagnosis not present

## 2021-11-26 ENCOUNTER — Encounter: Payer: Self-pay | Admitting: Obstetrics & Gynecology

## 2021-11-26 ENCOUNTER — Other Ambulatory Visit: Payer: Self-pay

## 2021-11-26 ENCOUNTER — Ambulatory Visit (INDEPENDENT_AMBULATORY_CARE_PROVIDER_SITE_OTHER): Payer: Federal, State, Local not specified - PPO | Admitting: Obstetrics & Gynecology

## 2021-11-26 VITALS — BP 138/88 | HR 108 | Wt 195.4 lb

## 2021-11-26 DIAGNOSIS — N920 Excessive and frequent menstruation with regular cycle: Secondary | ICD-10-CM | POA: Diagnosis not present

## 2021-11-26 DIAGNOSIS — N898 Other specified noninflammatory disorders of vagina: Secondary | ICD-10-CM | POA: Diagnosis not present

## 2021-11-26 DIAGNOSIS — Z1231 Encounter for screening mammogram for malignant neoplasm of breast: Secondary | ICD-10-CM | POA: Diagnosis not present

## 2021-11-26 DIAGNOSIS — Z113 Encounter for screening for infections with a predominantly sexual mode of transmission: Secondary | ICD-10-CM

## 2021-11-26 DIAGNOSIS — B3731 Acute candidiasis of vulva and vagina: Secondary | ICD-10-CM

## 2021-11-26 MED ORDER — NAPROXEN SODIUM 550 MG PO TABS: 550.0000 mg | ORAL_TABLET | Freq: Two times a day (BID) | ORAL | 2 refills | Status: DC

## 2021-11-26 MED ORDER — TRANEXAMIC ACID 650 MG PO TABS: 1300.0000 mg | ORAL_TABLET | Freq: Three times a day (TID) | ORAL | 2 refills | Status: DC

## 2021-11-26 NOTE — Progress Notes (Signed)
? ?GYNECOLOGY OFFICE VISIT NOTE ? ?History:  ? Heidi Woods is a 41 y.o. [id number] here today for evaluation of vulvar irritation and management of menorrhagia.  Vulvar irritation has been present for a few days, she desires evaluation and also desires comprehensive STI screen.  As for her menorrhagia, this has been happening for months. Periods are regular but last 5-7 days and the first 3-4 days are very heavy with clots.  She was evaluated by her PCP, hemoglobin was 10.3 on 10/22/21 with stable goiter noted on recent imaging, and pelvic ultrasound showed 3.3 cm pedunculated fibroid and physiologic right ovarian cysts. She is not on any hormonal therapy for contraception and she has not been taking anything for this bleeding.  She denies any syncopal symptoms, pelvic pain, problems with intercourse or other concerns.  ?  ?Past Medical History:  ?Diagnosis Date  ? Anemia   ? Goiter   ? 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 COMPOUND HETEROZYGOUS FOR HEMOGLOBIN C AND HEMOGLOBIN O ARAB  ? 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 COMPOUND HETEROZYGOUS FOR HEMOGLOBIN C AND HEMOGLOBIN O ARAB Hemoglobin O-Arab, a beta globin chain mutation, was first described in an [place] Arab family but its distribution is widespread. Heterozygotes are clinically asymptomatic with no hematologic abnormalities, while those homozygous for HbO-Arab may have a mild, asymptomatic, compensated hemolytic anemia.  Hemoglobin O-Arab is of clinical   ? LGSIL (low grade squamous intraepithelial lesion) on Pap smear 04/05/2013  ? Also had abnormal pap smear at age 69-22, had normal colpo and paps until 2014  ? Trichomonal vaginitis 06/22/2020  ? ? ?Past Surgical History:  ?Procedure Laterality Date  ? CHOLECYSTECTOMY  2012  ? INDUCED ABORTION    ? x 7  ? UMBILICAL HERNIA REPAIR    ? ? ?The following portions of the patient's history were reviewed and updated as appropriate: allergies, current medications, past family history, past medical  history, past social history, past surgical history and problem list.  ? ?Health Maintenance:  Normal pap and negative HRHPV on 10/11/2019.  Never had a mammogram and she desires breast examination today.  ? ?Review of Systems:  ?Pertinent items noted in HPI and remainder of comprehensive ROS otherwise negative. ? ?Physical Exam:  ?BP 138/88   Pulse (!) 108   Wt 195 lb 6.4 oz (88.6 kg)   LMP 11/13/2021 (Exact Date)   BMI 29.02 kg/m?  ?CONSTITUTIONAL: Well-developed, well-nourished female in no acute distress.  ?HEENT:  Normocephalic, atraumatic. External right and left ear normal. No scleral icterus.  ?NECK: Normal range of motion, supple, no masses noted on observation ?SKIN: No rash noted. Not diaphoretic. No erythema. No pallor. ?MUSCULOSKELETAL: Normal range of motion. No edema noted. ?NEUROLOGIC: Alert and oriented to person, place, and time. Normal muscle tone coordination. No cranial nerve deficit noted. ?PSYCHIATRIC: Normal mood and affect. Normal behavior. Normal judgment and thought content. ?CARDIOVASCULAR: Normal heart rate noted ?RESPIRATORY: Effort and breath sounds normal, no problems with respiration noted. ?BREASTS: Symmetric in size. No masses, tenderness, skin changes, nipple drainage, or lymphadenopathy bilaterally. ?ABDOMEN: No masses noted. No other overt distention noted.   ?PELVIC: Deferred ? ?Labs and Imaging ?US PELVIC COMPLETE WITH TRANSVAGINAL ?Result Date: 11/02/2021 ?CLINICAL DATA:  Initial evaluation for menorrhagia. EXAM: TRANSABDOMINAL AND TRANSVAGINAL ULTRASOUND OF PELVIS TECHNIQUE: Both transabdominal and transvaginal ultrasound examinations of the pelvis were performed. Transabdominal technique was performed for global imaging of the pelvis including uterus, ovaries, adnexal regions, and pelvic cul-de-sac. It was necessary to  proceed with endovaginal exam following the transabdominal exam to visualize the endometrium and ovaries. COMPARISON:  None available. FINDINGS: Uterus  Measurements: 10.4 x 5.4 x 6.3 cm = volume: 184.0 mL. Uterus is anteflexed. 2.6 x 3.3 x 3.3 cm pedunculated fibroid extends from the right uterine body. Endometrium Thickness: 6.1 mm.  No focal abnormality visualized. Right ovary Measurements: 5.3 x 3.1 x 3.6 cm = volume: 30.3 mL. 2.0 x 1.4 x 1.9 cm complex hypoechoic cyst with internal lace-like architecture, most characteristic of a small hemorrhagic cyst. No internal vascularity or solid nodularity. Additional complex isoechoic cystic lesion measures 3.6 x 2.1 x 2.8 cm, nonspecific, but could reflect an additional evolving hemorrhagic cyst. Left ovary Measurements: 3.4 x 2.6 x 2.8 cm = volume: 12.8 mL. Normal appearance/no adnexal mass. Other findings No abnormal free fluid. IMPRESSION: 1. Endometrial stripe within normal limits measuring 6.1 mm in thickness. If bleeding remains unresponsive to hormonal or medical therapy, sonohysterogram should be considered for focal lesion work-up. (Ref: Radiological Reasoning: Algorithmic Workup of Abnormal Vaginal Bleeding with Endovaginal Sonography and Sonohysterography. AJR 2008; 355:H74-16). 2. 3.3 cm pedunculated fibroid extending from the right uterine body. 3. Two distinct complex right ovarian cysts measuring up to 2.0 cm and 3.6 cm as above. While these findings are nonspecific, these are favored to reflect evolving hemorrhagic cysts. Short interval follow-up ultrasound in 6-12 weeks suggested for further evaluation and to ensure resolution. Electronically Signed   By: Rise Mu M.D.   On: 11/02/2021 18:49      ?Assessment and Plan:  ?   ?1. Breast cancer screening by mammogram ?Mammogram scheduled for patient ?- MM 3D SCREEN BREAST BILATERAL; Future ? ?2. Menorrhagia with regular cycle ?Patient does not want hormonal or surgical intervention. Discussed NSAIDs and Lysteda as options to help, she is interested in these. These were prescribed for her, will monitor response.  She is already on iron therapy  to help with anemia.  Bleeding precautions reviewed.  ?- tranexamic acid (LYSTEDA) 650 MG TABS tablet; Take 2 tablets (1,300 mg total) by mouth 3 (three) times daily. Take during menses for a maximum of five days  Dispense: 30 tablet; Refill: 2 ?- naproxen sodium (ANAPROX DS) 550 MG tablet; Take 1 tablet (550 mg total) by mouth 2 (two) times daily with a meal. As needed during periods or for pain  Dispense: 30 tablet; Refill: 2 ? ?3. Routine screening for STI (sexually transmitted infection) ?STI screen done, will follow up results and manage accordingly. ?- Cervicovaginal ancillary only( [place]) ?- RPR+HBsAg+HCVAb+HIV ? ?4. Vaginal irritation ?- Cervicovaginal ancillary only( [place]) done, will follow up results and manage accordingly. ? ?Routine preventative health maintenance measures emphasized. ?Please refer to After Visit Summary for other counseling recommendations.  ? ?Return for any gynecologic concerns.   ? ?I spent 25 minutes dedicated to the care of this patient including pre-visit review of records, face to face time with the patient discussing her conditions and treatments and post visit orders. ? ? ? ?Jaynie Collins, MD, FACOG ?Obstetrician Heritage manager, Faculty Practice ?[facility] for [employer], [facility] ? ? ? ? ? ? ?

## 2021-11-27 ENCOUNTER — Encounter: Payer: Self-pay | Admitting: Obstetrics & Gynecology

## 2021-11-27 LAB — RPR+HBSAG+HCVAB+...
HIV Screen 4th Generation wRfx: NONREACTIVE
Hep C Virus Ab: NONREACTIVE
Hepatitis B Surface Ag: NEGATIVE
RPR Ser Ql: NONREACTIVE

## 2021-11-28 LAB — CERVICOVAGINAL ANCILLARY ONLY
Bacterial Vaginitis (gardnerella): NEGATIVE
Candida Glabrata: NEGATIVE
Candida Vaginitis: POSITIVE — AB
Chlamydia: NEGATIVE
Comment: NEGATIVE
Comment: NEGATIVE
Comment: NEGATIVE
Comment: NEGATIVE
Comment: NEGATIVE
Comment: NORMAL
Neisseria Gonorrhea: NEGATIVE
Trichomonas: NEGATIVE

## 2021-11-28 MED ORDER — FLUCONAZOLE 150 MG PO TABS: 150.0000 mg | ORAL_TABLET | Freq: Once | ORAL | 3 refills | Status: DC

## 2021-11-28 NOTE — Addendum Note (Signed)
Addended by: Jaynie Collins A on: 11/28/2021 02:24 PM ? ? Modules accepted: Orders ? ?

## 2021-12-13 ENCOUNTER — Encounter: Payer: Self-pay | Admitting: Oncology

## 2022-01-02 ENCOUNTER — Ambulatory Visit
Admission: RE | Admit: 2022-01-02 | Discharge: 2022-01-02 | Disposition: A | Discharge status: Home / Self Care | Payer: Federal, State, Local not specified - PPO | Source: Ambulatory Visit | Attending: Obstetrics & Gynecology | Admitting: Obstetrics & Gynecology

## 2022-01-02 DIAGNOSIS — Z1231 Encounter for screening mammogram for malignant neoplasm of breast: Secondary | ICD-10-CM | POA: Diagnosis not present

## 2022-06-17 ENCOUNTER — Other Ambulatory Visit: Payer: Self-pay | Admitting: Obstetrics & Gynecology

## 2022-06-17 DIAGNOSIS — B3731 Acute candidiasis of vulva and vagina: Secondary | ICD-10-CM

## 2022-07-07 ENCOUNTER — Telehealth: Payer: Federal, State, Local not specified - PPO | Admitting: Physician Assistant

## 2022-07-07 ENCOUNTER — Encounter: Payer: Self-pay | Admitting: Internal Medicine

## 2022-07-07 DIAGNOSIS — U071 COVID-19: Secondary | ICD-10-CM | POA: Diagnosis not present

## 2022-07-07 MED ORDER — ALBUTEROL SULFATE HFA 108 (90 BASE) MCG/ACT IN AERS: 2.0000 | INHALATION_SPRAY | Freq: Four times a day (QID) | RESPIRATORY_TRACT | 0 refills | Status: AC | PRN

## 2022-07-07 MED ORDER — BENZONATATE 100 MG PO CAPS: 100.0000 mg | ORAL_CAPSULE | Freq: Three times a day (TID) | ORAL | 0 refills | Status: DC | PRN

## 2022-07-07 NOTE — Telephone Encounter (Signed)
Pt scheduled a virtual visit today.    Thanks,   -Mickel Baas

## 2022-07-07 NOTE — Progress Notes (Signed)
Virtual Visit Consent   Heidi Woods, you are scheduled for a virtual visit with a [facility] provider today. Just as with appointments in the office, your consent must be obtained to participate. Your consent will be active for this visit and any virtual visit you may have with one of our providers in the next 365 days. If you have a MyChart account, a copy of this consent can be sent to you electronically.  As this is a virtual visit, video technology does not allow for your provider to perform a traditional examination. This may limit your provider's ability to fully assess your condition. If your provider identifies any concerns that need to be evaluated in person or the need to arrange testing (such as labs, EKG, etc.), we will make arrangements to do so. Although advances in technology are sophisticated, we cannot ensure that it will always work on either your end or our end. If the connection with a video visit is poor, the visit may have to be switched to a telephone visit. With either a video or telephone visit, we are not always able to ensure that we have a secure connection.  By engaging in this virtual visit, you consent to the provision of healthcare and authorize for your insurance to be billed (if applicable) for the services provided during this visit. Depending on your insurance coverage, you may receive a charge related to this service.  I need to obtain your verbal consent now. Are you willing to proceed with your visit today? Heidi Woods has provided verbal consent on 07/07/2022 for a virtual visit (video or telephone). Heidi Woods, [place]  Date: 07/07/2022 1:01 PM  Virtual Visit via Video Note   I, Heidi Woods, connected with  Heidi Woods  ([id number], 1981/02/05) on 07/07/22 at  1:00 PM EDT by a video-enabled telemedicine application and verified that I am speaking with the correct person using two identifiers.  Location: Patient: Virtual Visit Location Patient:  Home Provider: Virtual Visit Location Provider: Home Office   I discussed the limitations of evaluation and management by telemedicine and the availability of in person appointments. The patient expressed understanding and agreed to proceed.    History of Present Illness: Heidi Woods is a 41 y.o. who identifies as a female who was assigned female at birth, and is being seen today for COVID-19. Notes symptoms starting Saturday with fatigue, chills and then fever (low-grade). Slept and woke up feeling worse. Sunday she rested all day. Developed rhinorrhea, aches, sore throat, cough. Denies chest pain but notes some SOBOE.   Tested for COVID at home this morning which was positive.  HPI: HPI  Problems:  Patient Active Problem List   Diagnosis Date Noted   Iron deficiency anemia 10/21/2016    Allergies: No Known Allergies Medications:  Current Outpatient Medications:    albuterol (VENTOLIN HFA) 108 (90 Base) MCG/ACT inhaler, Inhale 2 puffs into the lungs every 6 (six) hours as needed for wheezing or shortness of breath., Disp: 8 g, Rfl: 0   benzonatate (TESSALON) 100 MG capsule, Take 1 capsule (100 mg total) by mouth 3 (three) times daily as needed for cough., Disp: 30 capsule, Rfl: 0   Cholecalciferol (VITAMIN D3) 1.25 MG (50000 UT) CAPS, TAKE 1 CAPSULE BY MOUTH ONCE A WEEK, Disp: 12 capsule, Rfl: 1   famotidine (PEPCID) 20 MG tablet, Take 1 tablet (20 mg total) by mouth daily as needed for up to 10 days for heartburn or indigestion., Disp: 10 tablet,  Rfl: 0   naproxen sodium (ANAPROX DS) 550 MG tablet, Take 1 tablet (550 mg total) by mouth 2 (two) times daily with a meal. As needed during periods or for pain, Disp: 30 tablet, Rfl: 2   tranexamic acid (LYSTEDA) 650 MG TABS tablet, Take 2 tablets (1,300 mg total) by mouth 3 (three) times daily. Take during menses for a maximum of five days, Disp: 30 tablet, Rfl: 2  Observations/Objective: Patient is well-developed, well-nourished in no acute  distress.  Resting comfortably at home.  Head is normocephalic, atraumatic.  No labored breathing. Speech is clear and coherent with logical content.  Patient is alert and oriented at baseline.   Assessment and Plan: 1. COVID-19 - MyChart COVID-19 home monitoring program; Future - albuterol (VENTOLIN HFA) 108 (90 Base) MCG/ACT inhaler; Inhale 2 puffs into the lungs every 6 (six) hours as needed for wheezing or shortness of breath.  Dispense: 8 g; Refill: 0 - benzonatate (TESSALON) 100 MG capsule; Take 1 capsule (100 mg total) by mouth 3 (three) times daily as needed for cough.  Dispense: 30 capsule; Refill: 0  Patient with multiple risk factors for complicated course of illness. Discussed risks/benefits of antiviral medications including most common potential ADRs. Patient voiced understanding and would like to proceed with antiviral medication. They are candidate for Molnupiravir. Rx sent to pharmacy. Supportive measures, OTC medications and vitamin regimen reviewed. Tessalon and albuterol per orders. Patient has been enrolled in a MyChart COVID symptom monitoring program. Heidi Woods reviewed in detail. Strict ER precautions discussed with patient.    Follow Up Instructions: I discussed the assessment and treatment plan with the patient. The patient was provided an opportunity to ask questions and all were answered. The patient agreed with the plan and demonstrated an understanding of the instructions.  A copy of instructions were sent to the patient via MyChart unless otherwise noted below.   The patient was advised to call back or seek an in-person evaluation if the symptoms worsen or if the condition fails to improve as anticipated.  Time:  I spent 10 minutes with the patient via telehealth technology discussing the above problems/concerns.    [name], PA-C

## 2022-07-07 NOTE — Patient Instructions (Signed)
Heidi Woods, thank you for joining [name], PA-C for today's virtual visit.  While this provider is not your primary care provider (PCP), if your PCP is located in our provider database this encounter information will be shared with them immediately following your visit.   A [facility] MyChart account gives you access to today's visit and all your visits, tests, and labs performed at [facility] " click here if you don't have a [facility] MyChart account or go to [email or web address]  Consent: (Patient) Heidi Woods provided verbal consent for this virtual visit at the beginning of the encounter.  Current Medications:  Current Outpatient Medications:    Cholecalciferol (VITAMIN D3) 1.25 MG (50000 UT) CAPS, TAKE 1 CAPSULE BY MOUTH ONCE A WEEK, Disp: 12 capsule, Rfl: 1   famotidine (PEPCID) 20 MG tablet, Take 1 tablet (20 mg total) by mouth daily as needed for up to 10 days for heartburn or indigestion., Disp: 10 tablet, Rfl: 0   naproxen sodium (ANAPROX DS) 550 MG tablet, Take 1 tablet (550 mg total) by mouth 2 (two) times daily with a meal. As needed during periods or for pain, Disp: 30 tablet, Rfl: 2   tranexamic acid (LYSTEDA) 650 MG TABS tablet, Take 2 tablets (1,300 mg total) by mouth 3 (three) times daily. Take during menses for a maximum of five days, Disp: 30 tablet, Rfl: 2   Medications ordered in this encounter:  No orders of the defined types were placed in this encounter.    *If you need refills on other medications prior to your next appointment, please contact your pharmacy*  Follow-Up: Call back or seek an in-person evaluation if the symptoms worsen or if the condition fails to improve as anticipated.  [name] [phone]  Other Instructions Please keep well-hydrated and get plenty of rest. Start a saline nasal rinse to flush out your nasal passages. You can use plain Mucinex to help thin congestion. If you have a  humidifier, running in the bedroom at night. I want you to start OTC vitamin D3 1000 units daily, vitamin C 1000 mg daily, and a zinc supplement. Please take prescribed medications as directed.  You have been enrolled in a MyChart symptom monitoring program. Please answer these questions daily so we can keep track of how you are doing.  You were to quarantine for 5 days from onset of your symptoms.  After day 5, if you have had no fever and you are feeling better, you can end quarantine but need to mask for an additional 5 days. After day 5 if you have a fever or are having significant symptoms, please quarantine for full 10 days.  If you note any worsening of symptoms, any significant shortness of breath or any chest pain, please seek ER evaluation ASAP.  Please do not delay care!  COVID-19: What to Do if You Are Sick If you test positive and are an older adult or someone who is at high risk of getting very sick from COVID-19, treatment may be available. Contact a healthcare provider right away after a positive test to determine if you are eligible, even if your symptoms are mild right now. You can also visit a Test to Treat location and, if eligible, receive a prescription from a provider. Don't delay: Treatment must be started within the first few days to be effective. If you have a fever, cough, or other symptoms, you might have COVID-19. Most people have mild illness and are able  to recover at home. If you are sick: Keep track of your symptoms. If you have an emergency warning sign (including trouble breathing), call 911. Steps to help prevent the spread of COVID-19 if you are sick If you are sick with COVID-19 or think you might have COVID-19, follow the steps below to care for yourself and to help protect other people in your home and community. Stay home except to get medical care Stay home. Most people with COVID-19 have mild illness and can recover at home without medical care. Do not  leave your home, except to get medical care. Do not visit public areas and do not go to places where you are unable to wear a mask. Take care of yourself. Get rest and stay hydrated. Take over-the-counter medicines, such as acetaminophen, to help you feel better. Stay in touch with your doctor. Call before you get medical care. Be sure to get care if you have trouble breathing, or have any other emergency warning signs, or if you think it is an emergency. Avoid public transportation, ride-sharing, or taxis if possible. Get tested If you have symptoms of COVID-19, get tested. While waiting for test results, stay away from others, including staying apart from those living in your household. Get tested as soon as possible after your symptoms start. Treatments may be available for people with COVID-19 who are at risk for becoming very sick. Don't delay: Treatment must be started early to be effective--some treatments must begin within 5 days of your first symptoms. Contact your healthcare provider right away if your test result is positive to determine if you are eligible. Self-tests are one of several options for testing for the virus that causes COVID-19 and may be more convenient than laboratory-based tests and point-of-care tests. Ask your healthcare provider or your local health department if you need help interpreting your test results. You can visit your state, tribal, local, and territorial health department's website to look for the latest local information on testing sites. Separate yourself from other people As much as possible, stay in a specific room and away from other people and pets in your home. If possible, you should use a separate bathroom. If you need to be around other people or animals in or outside of the home, wear a well-fitting mask. Tell your close contacts that they may have been exposed to COVID-19. An infected person can spread COVID-19 starting 48 hours (or 2 days) before the  person has any symptoms or tests positive. By letting your close contacts know they may have been exposed to COVID-19, you are helping to protect everyone. See COVID-19 and Animals if you have questions about pets. If you are diagnosed with COVID-19, someone from the health department may call you. Answer the call to slow the spread. Monitor your symptoms Symptoms of COVID-19 include fever, cough, or other symptoms. Follow care instructions from your healthcare provider and local health department. Your local health authorities may give instructions on checking your symptoms and reporting information. When to seek emergency medical attention Look for emergency warning signs* for COVID-19. If someone is showing any of these signs, seek emergency medical care immediately: Trouble breathing Persistent pain or pressure in the chest New confusion Inability to wake or stay awake Pale, gray, or blue-colored skin, lips, or nail beds, depending on skin tone *This list is not all possible symptoms. Please call your medical provider for any other symptoms that are severe or concerning to you. Call 911 or call ahead  to your local emergency facility: Notify the operator that you are seeking care for someone who has or may have COVID-19. Call ahead before visiting your doctor Call ahead. Many medical visits for routine care are being postponed or done by phone or telemedicine. If you have a medical appointment that cannot be postponed, call your doctor's office, and tell them you have or may have COVID-19. This will help the office protect themselves and other patients. If you are sick, wear a well-fitting mask You should wear a mask if you must be around other people or animals, including pets (even at home). Wear a mask with the best fit, protection, and comfort for you. You don't need to wear the mask if you are alone. If you can't put on a mask (because of trouble breathing, for example), cover your  coughs and sneezes in some other way. Try to stay at least 6 feet away from other people. This will help protect the people around you. Masks should not be placed on young children under age 72 years, anyone who has trouble breathing, or anyone who is not able to remove the mask without help. Cover your coughs and sneezes Cover your mouth and nose with a tissue when you cough or sneeze. Throw away used tissues in a lined trash can. Immediately wash your hands with soap and water for at least 20 seconds. If soap and water are not available, clean your hands with an alcohol-based hand sanitizer that contains at least 60% alcohol. Clean your hands often Wash your hands often with soap and water for at least 20 seconds. This is especially important after blowing your nose, coughing, or sneezing; going to the bathroom; and before eating or preparing food. Use hand sanitizer if soap and water are not available. Use an alcohol-based hand sanitizer with at least 60% alcohol, covering all surfaces of your hands and rubbing them together until they feel dry. Soap and water are the best option, especially if hands are visibly dirty. Avoid touching your eyes, nose, and mouth with unwashed hands. Handwashing Tips Avoid sharing personal household items Do not share dishes, drinking glasses, cups, eating utensils, towels, or bedding with other people in your home. Wash these items thoroughly after using them with soap and water or put in the dishwasher. Clean surfaces in your home regularly Clean and disinfect high-touch surfaces (for example, doorknobs, tables, handles, light switches, and countertops) in your "sick room" and bathroom. In shared spaces, you should clean and disinfect surfaces and items after each use by the person who is ill. If you are sick and cannot clean, a caregiver or other person should only clean and disinfect the area around you (such as your bedroom and bathroom) on an as needed basis.  Your caregiver/other person should wait as long as possible (at least several hours) and wear a mask before entering, cleaning, and disinfecting shared spaces that you use. Clean and disinfect areas that may have blood, stool, or body fluids on them. Use household cleaners and disinfectants. Clean visible dirty surfaces with household cleaners containing soap or detergent. Then, use a household disinfectant. Use a product from [employer] List N: Disinfectants for Coronavirus (COVID-19). Be sure to follow the instructions on the label to ensure safe and effective use of the product. Many products recommend keeping the surface wet with a disinfectant for a certain period of time (look at "contact time" on the product label). You may also need to wear personal protective equipment, such as  gloves, depending on the directions on the product label. Immediately after disinfecting, wash your hands with soap and water for 20 seconds. For completed guidance on cleaning and disinfecting your home, visit Complete Disinfection Guidance. Take steps to improve ventilation at home Improve ventilation (air flow) at home to help prevent from spreading COVID-19 to other people in your household. Clear out COVID-19 virus particles in the air by opening windows, using air filters, and turning on fans in your home. Use this interactive tool to learn how to improve air flow in your home. When you can be around others after being sick with COVID-19 Deciding when you can be around others is different for different situations. Find out when you can safely end home isolation. For any additional questions about your care, contact your healthcare provider or state or local health department. 12/04/2020 Content source: [facility] ([facility]), Division of Viral Diseases This information is not intended to replace advice given to you by your health care provider. Make sure you discuss any  questions you have with your health care provider. Document Revised: 01/17/2021 Document Reviewed: 01/17/2021 Elsevier Patient Education  2022 [employer].      If you have been instructed to have an in-person evaluation today at a local Urgent Care facility, please use the link below. It will take you to a list of all of our available [facility], including address, phone number and hours of operation. Please do not delay care.  [facility]  If you or a family member do not have a primary care provider, use the link below to schedule a visit and establish care. When you choose a [facility] primary care physician or advanced practice provider, you gain a long-term partner in health. Find a Primary Care Provider  Learn more about [facility] in-office and virtual care options: [facility] - Get Care Now

## 2022-07-08 ENCOUNTER — Telehealth: Payer: Self-pay

## 2022-07-08 NOTE — Telephone Encounter (Signed)
Called and spoke to patient in regards to the [place] that was completed on mychart. Advise patient of care advise from my chart companion. Patient verbal understanding.  Care advise given:  If cough remains the same or better: continue to treat with over the counter medications.  Hard candy or cough drops and drinking warm fluids. Adults can also use honey 2 tsp (10 ML) at bedtime. If cough is becoming worse even with the use of over the counter medications and patient is not able to sleep at night, cough becomes productive with sputum that maybe yellow or green in color, contact PCP.

## 2022-12-02 ENCOUNTER — Ambulatory Visit (INDEPENDENT_AMBULATORY_CARE_PROVIDER_SITE_OTHER): Payer: Federal, State, Local not specified - PPO | Admitting: Obstetrics & Gynecology

## 2022-12-02 ENCOUNTER — Encounter: Payer: Self-pay | Admitting: Obstetrics & Gynecology

## 2022-12-02 ENCOUNTER — Other Ambulatory Visit (HOSPITAL_COMMUNITY)
Admission: RE | Admit: 2022-12-02 | Discharge: 2022-12-02 | Disposition: A | Discharge status: Home / Self Care | Payer: Federal, State, Local not specified - PPO | Source: Ambulatory Visit | Attending: Obstetrics & Gynecology | Admitting: Obstetrics & Gynecology

## 2022-12-02 VITALS — BP 137/81 | HR 101 | Wt 199.0 lb

## 2022-12-02 DIAGNOSIS — D649 Anemia, unspecified: Secondary | ICD-10-CM | POA: Diagnosis not present

## 2022-12-02 DIAGNOSIS — N760 Acute vaginitis: Secondary | ICD-10-CM | POA: Diagnosis not present

## 2022-12-02 DIAGNOSIS — N920 Excessive and frequent menstruation with regular cycle: Secondary | ICD-10-CM

## 2022-12-02 DIAGNOSIS — D219 Benign neoplasm of connective and other soft tissue, unspecified: Secondary | ICD-10-CM

## 2022-12-02 DIAGNOSIS — Z01419 Encounter for gynecological examination (general) (routine) without abnormal findings: Secondary | ICD-10-CM | POA: Diagnosis not present

## 2022-12-02 DIAGNOSIS — Z0000 Encounter for general adult medical examination without abnormal findings: Secondary | ICD-10-CM | POA: Diagnosis not present

## 2022-12-02 DIAGNOSIS — Z1231 Encounter for screening mammogram for malignant neoplasm of breast: Secondary | ICD-10-CM

## 2022-12-02 DIAGNOSIS — B3731 Acute candidiasis of vulva and vagina: Secondary | ICD-10-CM

## 2022-12-02 MED ORDER — NYSTATIN-TRIAMCINOLONE 100000-0.1 UNIT/GM-% EX CREA: 1.0000 | TOPICAL_CREAM | Freq: Two times a day (BID) | CUTANEOUS | 0 refills | Status: DC

## 2022-12-02 NOTE — Progress Notes (Signed)
GYNECOLOGY ANNUAL PREVENTATIVE CARE ENCOUNTER NOTE  History:     Heidi Woods is a 42 y.o. [id number] female here for a routine annual gynecologic exam.  Current complaints: heavy menstrual periods over the last two cycles, and feels she has some abdominal distention. Also reports vulvar and vaginal irritation for the past two weeks with some vaginal discharge. Denies problems with intercourse or other gynecologic concerns.    Gynecologic History Patient's last menstrual period was 11/14/2022. Contraception: abstinence Last Pap: 10/01/19. Result was normal with negative HPV Last Mammogram: 01/02/22.  Result was normal  Obstetric History OB History  Gravida Para Term Preterm AB Living  13 4 4  0 9 4  SAB IAB Ectopic Multiple Live Births  2 7 0 0 2    # Outcome Date GA Lbr Len/2nd Weight Sex Delivery Anes PTL Lv  13 Term 07/22/16 [redacted]w[redacted]d 07:44 / 00:22 6 lb 11.2 oz (3.04 kg) M Vag-Spont None  LIV  12 Term 09/24/13 107w2d 06:00 / 02:34 8 lb 9.6 oz (3.9 kg) M Vag-Spont EPI  LIV  11 Term 2007   6 lb 2 oz (2.778 kg) M Vag-Spont     10 Term 2004   7 lb 6 oz (3.345 kg) F Vag-Spont     9 IAB           8 IAB           7 IAB           6 IAB           5 IAB           4 IAB           3 IAB           2 SAB           1 SAB             Past Medical History:  Diagnosis Date   Anemia    Goiter    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 Heterozygotes are clinically asymptomatic with no hematologic abnormalities, while those homozygous for HbO-Arab may have a mild, asymptomatic, compensated hemolytic anemia.  Hemoglobin O-Arab is of clinical    LGSIL (low grade squamous intraepithelial lesion) on Pap smear  04/05/2013   Also had abnormal pap smear at age 2-22, had normal colpo and paps until 2014   Trichomonal vaginitis 06/22/2020    Past Surgical History:  Procedure Laterality Date   CHOLECYSTECTOMY  2012   INDUCED ABORTION     x 7   UMBILICAL HERNIA REPAIR      Current Outpatient Medications on File Prior to Visit  Medication Sig Dispense Refill   albuterol (VENTOLIN HFA) 108 (90 Base) MCG/ACT inhaler Inhale 2 puffs into the lungs every 6 (six) hours as needed for wheezing or shortness of breath. 8 g 0   Cholecalciferol (VITAMIN D3) 1.25 MG (50000 UT) CAPS TAKE 1 CAPSULE BY MOUTH ONCE A WEEK 12 capsule 1   naproxen sodium (ANAPROX DS) 550 MG tablet Take 1 tablet (550 mg total) by mouth 2 (two) times daily with a meal. As needed during periods or for pain 30 tablet 2   benzonatate (  TESSALON) 100 MG capsule Take 1 capsule (100 mg total) by mouth 3 (three) times daily as needed for cough. (Patient not taking: Reported on 12/02/2022) 30 capsule 0   famotidine (PEPCID) 20 MG tablet Take 1 tablet (20 mg total) by mouth daily as needed for up to 10 days for heartburn or indigestion. 10 tablet 0   tranexamic acid (LYSTEDA) 650 MG TABS tablet Take 2 tablets (1,300 mg total) by mouth 3 (three) times daily. Take during menses for a maximum of five days (Patient not taking: Reported on 12/02/2022) 30 tablet 2   No current facility-administered medications on file prior to visit.    No Known Allergies  Social History:  reports that she has never smoked. She has never used smokeless tobacco. She reports that she does not drink alcohol and does not use drugs.  Family History  Problem Relation Age of Onset   Migraines Mother    Sickle cell anemia Father    Prostate cancer Father    Throat cancer Maternal Uncle    Breast cancer Maternal Grandmother    Hypertension Maternal Grandmother    Cancer Maternal Grandmother        colon   Prostate cancer Maternal Grandfather    Hypertension Maternal  Grandfather    Cancer Paternal Grandfather        throat, lung    The following portions of the patient's history were reviewed and updated as appropriate: allergies, current medications, past family history, past medical history, past social history, past surgical history and problem list.  Review of Systems Pertinent items noted in HPI and remainder of comprehensive ROS otherwise negative.  Physical Exam:  BP 137/81   Pulse (!) 101   Wt 199 lb (90.3 kg)   LMP 11/14/2022   BMI 29.56 kg/m  CONSTITUTIONAL: Well-developed, well-nourished female in no acute distress.  HENT:  Normocephalic, atraumatic, External right and left ear normal.  EYES: Conjunctivae and EOM are normal. Pupils are equal, round, and reactive to light. No scleral icterus.  NECK: Normal range of motion, supple, no masses.  Normal thyroid.  SKIN: Skin is warm and dry. No rash noted. Not diaphoretic. No erythema. No pallor. MUSCULOSKELETAL: Normal range of motion. No tenderness.  No cyanosis, clubbing, or edema. NEUROLOGIC: Alert and oriented to person, place, and time. Normal reflexes, muscle tone coordination.  PSYCHIATRIC: Normal mood and affect. Normal behavior. Normal judgment and thought content. CARDIOVASCULAR: Normal heart rate noted, regular rhythm RESPIRATORY: Clear to auscultation bilaterally. Effort and breath sounds normal, no problems with respiration noted. BREASTS: Symmetric in size. No masses, tenderness, skin changes, nipple drainage, or lymphadenopathy bilaterally. Performed in the presence of a chaperone. ABDOMEN: Soft, no distention noted.  No tenderness, rebound or guarding.  PELVIC: Erythematous small ulcerations noted at the superior confluence of the labia majora, very tender to touch. HSV culture obtained.  Rest of labia is erythematous and edematous, no other ulcerations noted. White discharge noted, testing sample obtained. Normal appearing cervix.  Pap smear obtained, there was some bleeding  after pap smear.  Normal uterine size, no other palpable masses, no uterine or adnexal tenderness.  Performed in the presence of a chaperone.   Assessment and Plan:    1. Vulvovaginitis Worried about extensive inflammation secondary to yeast or possible HSV infection; will follow up test results. Mycology cream prescribed for now. - HSV culture - Cervicovaginal ancillary only - nystatin-triamcinolone (MYCOLOG II) cream; Apply 1 Application topically 2 (two) times daily.  Dispense: 30 g; Refill:  0  2. Fibroids Last scan in 2023 showed 3.3 pedunculated fibroid extending from right uterine body.  Will get ultrasound to evaluated for interval changes. - US PELVIC COMPLETE WITH TRANSVAGINAL; Future  3. Menorrhagia with regular cycle Ultrasound ordered. - US PELVIC COMPLETE WITH TRANSVAGINAL; Future  4. Breast cancer screening by mammogram Mammogram scheduled - MM 3D SCREENING MAMMOGRAM BILATERAL BREAST; Future  5. Well woman exam with routine gynecological exam Patient desires preventative healthcare maintenance labs. - CBC - Comprehensive metabolic panel - Lipid panel - Hemoglobin A1c - TSH Rfx on Abnormal to Free T4 - Cytology - PAP Will follow up results of pap smear and labs and manage accordingly. Routine preventative health maintenance measures emphasized. Please refer to After Visit Summary for other counseling recommendations.      Verita Schneiders, MD, [place] [facility] [employer], [place]

## 2022-12-03 LAB — CERVICOVAGINAL ANCILLARY ONLY
Bacterial Vaginitis (gardnerella): NEGATIVE
Candida Glabrata: NEGATIVE
Candida Vaginitis: POSITIVE — AB
Comment: NEGATIVE
Comment: NEGATIVE
Comment: NEGATIVE
Comment: NEGATIVE
Trichomonas: NEGATIVE

## 2022-12-03 LAB — COMPREHENSIVE METABOLIC PANEL
ALT: 6 IU/L (ref 0–32)
AST: 12 IU/L (ref 0–40)
Albumin/Globulin Ratio: 1.2 (ref 1.2–2.2)
Albumin: 4.2 g/dL (ref 3.9–4.9)
Alkaline Phosphatase: 81 IU/L (ref 44–121)
BUN/Creatinine Ratio: 8 — ABNORMAL LOW (ref 9–23)
BUN: 6 mg/dL (ref 6–24)
Bilirubin Total: 0.5 mg/dL (ref 0.0–1.2)
CO2: 22 mmol/L (ref 20–29)
Calcium: 8.7 mg/dL (ref 8.7–10.2)
Chloride: 108 mmol/L — ABNORMAL HIGH (ref 96–106)
Creatinine, Ser: 0.74 mg/dL (ref 0.57–1.00)
Globulin, Total: 3.5 g/dL (ref 1.5–4.5)
Glucose: 91 mg/dL (ref 70–99)
Potassium: 3.5 mmol/L (ref 3.5–5.2)
Sodium: 144 mmol/L (ref 134–144)
Total Protein: 7.7 g/dL (ref 6.0–8.5)
eGFR: 104 mL/min/{1.73_m2} (ref >59)

## 2022-12-03 LAB — CBC
Hematocrit: 26 % — ABNORMAL LOW (ref 34.0–46.6)
Hemoglobin: 8.2 g/dL — ABNORMAL LOW (ref 11.1–15.9)
MCH: 20.8 pg — ABNORMAL LOW (ref 26.6–33.0)
MCHC: 31.5 g/dL (ref 31.5–35.7)
MCV: 66 fL — ABNORMAL LOW (ref 79–97)
Platelets: 218 10*3/uL (ref 150–450)
RBC: 3.95 x10E6/uL (ref 3.77–5.28)
RDW: 20.9 % — ABNORMAL HIGH (ref 11.7–15.4)
WBC: 7.2 10*3/uL (ref 3.4–10.8)

## 2022-12-03 LAB — LIPID PANEL
Chol/HDL Ratio: 3 ratio (ref 0.0–4.4)
Cholesterol, Total: 127 mg/dL (ref 100–199)
HDL: 43 mg/dL (ref >39)
LDL Chol Calc (NIH): 66 mg/dL (ref 0–99)
Triglycerides: 96 mg/dL (ref 0–149)
VLDL Cholesterol Cal: 18 mg/dL (ref 5–40)

## 2022-12-03 LAB — HEMOGLOBIN A1C
Est. average glucose Bld gHb Est-mCnc: 94 mg/dL
Hgb A1c MFr Bld: 4.9 % (ref 4.8–5.6)

## 2022-12-03 LAB — TSH RFX ON ABNORMAL TO FREE T4: TSH: 1.37 u[IU]/mL (ref 0.450–4.500)

## 2022-12-03 MED ORDER — FERRIC MALTOL 30 MG PO CAPS: 1.0000 | ORAL_CAPSULE | Freq: Two times a day (BID) | ORAL | 2 refills | Status: DC

## 2022-12-03 MED ORDER — FLUCONAZOLE 150 MG PO TABS: 150.0000 mg | ORAL_TABLET | ORAL | 3 refills | Status: DC

## 2022-12-03 NOTE — Addendum Note (Signed)
Addended by: Verita Schneiders A on: 12/03/2022 05:12 PM   Modules accepted: Orders

## 2022-12-04 LAB — HERPES SIMPLEX VIRUS CULTURE

## 2022-12-08 LAB — CYTOLOGY - PAP
Comment: NEGATIVE
Comment: NEGATIVE
Diagnosis: NEGATIVE
HSV1: NEGATIVE
HSV2: NEGATIVE
High risk HPV: NEGATIVE

## 2022-12-09 ENCOUNTER — Ambulatory Visit
Admission: RE | Admit: 2022-12-09 | Discharge: 2022-12-09 | Disposition: A | Discharge status: Home / Self Care | Payer: Federal, State, Local not specified - PPO | Source: Ambulatory Visit | Attending: Obstetrics & Gynecology | Admitting: Obstetrics & Gynecology

## 2022-12-09 DIAGNOSIS — D219 Benign neoplasm of connective and other soft tissue, unspecified: Secondary | ICD-10-CM | POA: Insufficient documentation

## 2022-12-09 DIAGNOSIS — N920 Excessive and frequent menstruation with regular cycle: Secondary | ICD-10-CM | POA: Diagnosis not present

## 2022-12-09 DIAGNOSIS — N83202 Unspecified ovarian cyst, left side: Secondary | ICD-10-CM | POA: Diagnosis not present

## 2022-12-26 ENCOUNTER — Ambulatory Visit (INDEPENDENT_AMBULATORY_CARE_PROVIDER_SITE_OTHER): Payer: Federal, State, Local not specified - PPO | Admitting: Internal Medicine

## 2022-12-26 ENCOUNTER — Encounter: Payer: Self-pay | Admitting: Internal Medicine

## 2022-12-26 VITALS — BP 128/72 | HR 103 | Temp 96.8°F | Wt 205.0 lb

## 2022-12-26 DIAGNOSIS — R197 Diarrhea, unspecified: Secondary | ICD-10-CM

## 2022-12-26 DIAGNOSIS — R140 Abdominal distension (gaseous): Secondary | ICD-10-CM

## 2022-12-26 DIAGNOSIS — R112 Nausea with vomiting, unspecified: Secondary | ICD-10-CM

## 2022-12-26 DIAGNOSIS — K921 Melena: Secondary | ICD-10-CM

## 2022-12-26 DIAGNOSIS — R1030 Lower abdominal pain, unspecified: Secondary | ICD-10-CM | POA: Diagnosis not present

## 2022-12-26 NOTE — Patient Instructions (Signed)
Abdominal Bloating When you have abdominal bloating, your abdomen may feel full, tight, or painful. It may also look bigger than normal or swollen (distended). Common causes of abdominal bloating include: Swallowing air. Constipation. Problems digesting food. Eating too much. Irritable bowel syndrome. This is a condition that affects the large intestine. Lactose intolerance. This is an inability to digest lactose, a natural sugar in dairy products. Celiac disease. This is a condition that affects the ability to digest gluten, a protein found in some grains. Gastroparesis. This is a condition that slows down the movement of food in the stomach and small intestine. It is more common in people with diabetes mellitus. Gastroesophageal reflux disease (GERD). This is a condition that makes stomach acid flow back into the esophagus. Urinary retention. This means that the body is holding onto urine, and the bladder cannot be emptied all the way. Follow these instructions at home: Eating and drinking Avoid eating too much. Try not to swallow air while talking or eating. Avoid eating while lying down. Avoid these foods and drinks: Foods that cause gas, such as broccoli, cabbage, cauliflower, and baked beans. Carbonated drinks. Hard candy. Chewing gum. Medicines Take over-the-counter and prescription medicines only as told by your health care provider. Take probiotic medicines. These medicines contain live bacteria or yeasts that can help digestion. Take coated peppermint oil capsules. General instructions Try to exercise regularly. Exercise may help to relieve bloating that is caused by gas and relieve constipation. Keep all follow-up visits. This is important. Contact a health care provider if: You have nausea and vomiting. You have diarrhea. You have abdominal pain. You have unusual weight loss or weight gain. You have severe pain, and medicines do not help. Get help right away if: You  have chest pain. You have trouble breathing. You have shortness of breath. You have trouble urinating. You have darker urine than normal. You have blood in your stools or have dark, tarry stools. These symptoms may represent a serious problem that is an emergency. Do not wait to see if the symptoms will go away. Get medical help right away. Call your local emergency services (911 in the [place] Do not drive yourself to the hospital. Summary Abdominal bloating means that the abdomen is swollen. Common causes of abdominal bloating are swallowing air, constipation, and problems digesting food. Avoid eating too much and avoid swallowing air. Avoid foods that cause gas, carbonated drinks, hard candy, and chewing gum. This information is not intended to replace advice given to you by your health care provider. Make sure you discuss any questions you have with your health care provider. Document Revised: 04/03/2020 Document Reviewed: 04/03/2020 Elsevier Patient Education  2023 [employer]  

## 2022-12-26 NOTE — Progress Notes (Signed)
Subjective:    Patient ID: Heidi Woods, female    DOB: 07/02/1981, 42 y.o.   MRN: [id number]  HPI  Patient presents to clinic today with complaint of lower abdominal pain and bloating. She noticed this 1 month ago. She describes the pain as cramping. She reports associated nausea, intermittent vomiting and diarrhea. She has had some blood in her stool only on 1 occurance.  She is tried to tie the bloating to certain foods but has been unable to do so.  She denies constipation or gassiness.  She has taken nausea medicine OTC with minimal relief of symptoms.  She does have a history of fibroids.  Ultrasound from 11/2022 reviewed.  She has no concerns of pregnancy.  She has gained 6 lbs in about 3 weeks.  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 Heterozygotes are clinically asymptomatic with no hematologic abnormalities, while those homozygous for HbO-Arab may have a mild, asymptomatic, compensated hemolytic anemia.  Hemoglobin O-Arab is of clinical    LGSIL (low grade squamous intraepithelial lesion) on Pap smear 04/05/2013   Also had abnormal pap smear at age 43-22, had normal colpo and paps until 2014   Trichomonal vaginitis 06/22/2020    Current Outpatient Medications  Medication Sig Dispense Refill   albuterol (VENTOLIN HFA) 108 (90 Base) MCG/ACT inhaler Inhale 2 puffs into the lungs every 6 (six) hours as needed for wheezing or shortness of breath. 8 g 0   benzonatate (TESSALON) 100 MG capsule Take 1 capsule (100 mg total) by mouth 3 (three) times daily as needed for cough. (Patient not taking: Reported on 12/02/2022) 30 capsule  0   Cholecalciferol (VITAMIN D3) 1.25 MG (50000 UT) CAPS TAKE 1 CAPSULE BY MOUTH ONCE A WEEK 12 capsule 1   famotidine (PEPCID) 20 MG tablet Take 1 tablet (20 mg total) by mouth daily as needed for up to 10 days for heartburn or indigestion. 10 tablet 0   Ferric Maltol 30 MG CAPS Take 1 capsule (30 mg total) by mouth 2 (two) times daily. Please take one hour before breakfast and dinner 60 capsule 2   fluconazole (DIFLUCAN) 150 MG tablet Take 1 tablet (150 mg total) by mouth every 3 (three) days. For three doses 3 tablet 3   naproxen sodium (ANAPROX DS) 550 MG tablet Take 1 tablet (550 mg total) by mouth 2 (two) times daily with a meal. As needed during periods or for pain 30 tablet 2   nystatin-triamcinolone (MYCOLOG II) cream Apply 1 Application topically 2 (two) times daily. 30 g 0   tranexamic acid (LYSTEDA) 650 MG TABS tablet Take 2 tablets (1,300 mg total) by mouth 3 (three) times daily. Take during menses for a maximum of five days (Patient not taking: Reported on 12/02/2022) 30 tablet 2   No current facility-administered medications for this visit.    No Known Allergies  Family History  Problem Relation Age of Onset   Migraines Mother    Sickle cell anemia Father    Prostate cancer Father    Throat cancer Maternal Uncle    Breast cancer Maternal Grandmother  Hypertension Maternal Grandmother    Cancer Maternal Grandmother        colon   Prostate cancer Maternal Grandfather    Hypertension Maternal Grandfather    Cancer Paternal Grandfather        throat, lung    Social History   Socioeconomic History   Marital status: Single    Spouse name: Not on file   Number of children: Not on file   Years of education: Not on file   Highest education level: Some college, no degree  Occupational History   Not on file  Tobacco Use   Smoking status: Never   Smokeless tobacco: Never  Vaping Use   Vaping Use: Never used  Substance and Sexual Activity   Alcohol use: No   Drug  use: No   Sexual activity: Not Currently    Partners: Male  Other Topics Concern   Not on file  Social History Narrative   Not on file   Social Determinants of Health   Financial Resource Strain: Low Risk  (12/25/2022)   Overall Financial Resource Strain (CARDIA)    Difficulty of Paying Living Expenses: Not hard at all  Food Insecurity: No Food Insecurity (12/25/2022)   Hunger Vital Sign    Worried About Running Out of Food in the Last Year: Never true    Ran Out of Food in the Last Year: Never true  Transportation Needs: No Transportation Needs (12/25/2022)   PRAPARE - Administrator, Civil Service (Medical): No    Lack of Transportation (Non-Medical): No  Physical Activity: Insufficiently Active (12/25/2022)   Exercise Vital Sign    Days of Exercise per Week: 5 days    Minutes of Exercise per Session: 10 min  Stress: No Stress Concern Present (12/25/2022)   [employer] of Occupational Health - Occupational Stress Questionnaire    Feeling of Stress : Only a little  Social Connections: Unknown (12/25/2022)   Social Connection and Isolation Panel [NHANES]    Frequency of Communication with Friends and Family: Once a week    Frequency of Social Gatherings with Friends and Family: Patient declined    Attends Religious Services: More than 4 times per year    Active Member of [employer] or Organizations: Yes    Attends Engineer, structural: More than 4 times per year    Marital Status: Never married  Intimate Partner Violence: Not on file     Constitutional: Patient reports abnormal weight gain.  Denies fever, malaise, fatigue, headache.  HEENT: Denies eye pain, eye redness, ear pain, ringing in the ears, wax buildup, runny nose, nasal congestion, bloody nose, or sore throat. Respiratory: Denies difficulty breathing, shortness of breath, cough or sputum production.   Cardiovascular: Denies chest pain, chest tightness, palpitations or swelling in the hands or feet.   Gastrointestinal: Patient reports abdominal pain, bloating, nausea, diarrhea and blood in stool.  Denies constipation.  GU: Denies urgency, frequency, pain with urination, burning sensation, blood in urine, odor or discharge.  No other specific complaints in a complete review of systems (except as listed in HPI above).  Objective:   Physical Exam  BP 128/72 (BP Location: Left Arm, Patient Position: Sitting, Cuff Size: Normal)   Pulse (!) 103   Temp (!) 96.8 F (36 C) (Temporal)   Wt 205 lb (93 kg)   LMP 11/14/2022   SpO2 99%   BMI 30.45 kg/m   Wt Readings from Last 3 Encounters:  12/02/22 199 lb (90.3 kg)  11/26/21 195 lb 6.4 oz (88.6 kg)  10/22/21 196 lb (88.9 kg)    General: Appears her stated age, obese in NAD. Skin: Warm, dry and intact.  Cardiovascular: Tachycardic with normal rhythm. Pulmonary/Chest: Normal effort and positive vesicular breath sounds. No respiratory distress. No wheezes, rales or ronchi noted.  Abdomen: There is slight distention between the right upper quadrant and right lower quadrant.  Nontender.  Hypoactive bowel sounds.  Tympanic to percussion percussion.  Liver, spleen and kidneys non palpable. Musculoskeletal: No difficulty with gait.  Neurological: Alert and oriented. Coordination normal.     BMET    Component Value Date/Time   NA 144 12/02/2022 1121   NA 136 04/04/2013 0834   K 3.5 12/02/2022 1121   K 3.3 (L) 04/04/2013 0834   CL 108 (H) 12/02/2022 1121   CL 107 04/04/2013 0834   CO2 22 12/02/2022 1121   CO2 23 04/04/2013 0834   GLUCOSE 91 12/02/2022 1121   GLUCOSE 94 10/22/2021 1450   GLUCOSE 104 (H) 04/04/2013 0834   BUN 6 12/02/2022 1121   BUN 11 04/04/2013 0834   CREATININE 0.74 12/02/2022 1121   CREATININE 0.69 10/22/2021 1450   CALCIUM 8.7 12/02/2022 1121   CALCIUM 8.9 04/04/2013 0834   GFRNONAA 95 10/13/2019 0824   GFRNONAA >60 04/04/2013 0834   GFRAA 109 10/13/2019 0824   GFRAA >60 04/04/2013 0834    Lipid Panel      Component Value Date/Time   CHOL 127 12/02/2022 1121   TRIG 96 12/02/2022 1121   HDL 43 12/02/2022 1121   CHOLHDL 3.0 12/02/2022 1121   CHOLHDL 2.9 10/22/2021 1450   LDLCALC 66 12/02/2022 1121   LDLCALC 64 10/22/2021 1450    CBC    Component Value Date/Time   WBC 7.2 12/02/2022 1121   WBC 6.5 10/22/2021 1450   RBC 3.95 12/02/2022 1121   RBC 4.08 10/22/2021 1450   HGB 8.2 (L) 12/02/2022 1121   HCT 26.0 (L) 12/02/2022 1121   PLT 218 12/02/2022 1121   MCV 66 (L) 12/02/2022 1121   MCV 74 (L) 04/04/2013 0834   MCH 20.8 (L) 12/02/2022 1121   MCH 25.2 (L) 10/22/2021 1450   MCHC 31.5 12/02/2022 1121   MCHC 33.1 10/22/2021 1450   RDW 20.9 (H) 12/02/2022 1121   RDW 21.1 (H) 04/04/2013 0834   LYMPHSABS 2.1 11/03/2016 1350   MONOABS 0.3 11/03/2016 1350   EOSABS 0.0 11/03/2016 1350   BASOSABS 0.0 11/03/2016 1350    Hgb A1C Lab Results  Component Value Date   HGBA1C 4.9 12/02/2022           Assessment & Plan:  Abdominal Bloating, Lower Abdominal Pain, Nausea, Vomiting, Diarrhea and Blood in Stool:  Will check for H. Pylori Will obtain CT abdomen/pelvis with contrast Try gasX over the weekend to see if this improves her symptoms Consider referral to GI for further evaluation of symptoms  Schedule an appointment for your annual exam Nicki Reaper, NP

## 2022-12-30 ENCOUNTER — Ambulatory Visit: Payer: Federal, State, Local not specified - PPO | Admitting: Internal Medicine

## 2022-12-31 LAB — H. PYLORI BREATH TEST: H. pylori Breath Test: NOT DETECTED

## 2023-01-06 ENCOUNTER — Ambulatory Visit
Admission: RE | Admit: 2023-01-06 | Discharge: 2023-01-06 | Disposition: A | Discharge status: Home / Self Care | Payer: Federal, State, Local not specified - PPO | Source: Ambulatory Visit | Attending: Internal Medicine | Admitting: Internal Medicine

## 2023-01-06 DIAGNOSIS — R1030 Lower abdominal pain, unspecified: Secondary | ICD-10-CM

## 2023-01-06 DIAGNOSIS — R140 Abdominal distension (gaseous): Secondary | ICD-10-CM | POA: Insufficient documentation

## 2023-01-06 MED ORDER — IOHEXOL 300 MG/ML  SOLN
100.0000 mL | Freq: Once | INTRAMUSCULAR | Status: AC | PRN
  Administered 2023-01-06: 100 mL via INTRAVENOUS

## 2023-01-08 ENCOUNTER — Ambulatory Visit
Admission: RE | Admit: 2023-01-08 | Discharge: 2023-01-08 | Disposition: A | Discharge status: Home / Self Care | Payer: Federal, State, Local not specified - PPO | Source: Ambulatory Visit | Attending: Obstetrics & Gynecology | Admitting: Obstetrics & Gynecology

## 2023-01-08 ENCOUNTER — Other Ambulatory Visit: Payer: Self-pay | Admitting: Obstetrics & Gynecology

## 2023-01-08 DIAGNOSIS — Z1231 Encounter for screening mammogram for malignant neoplasm of breast: Secondary | ICD-10-CM | POA: Insufficient documentation

## 2023-01-08 DIAGNOSIS — N760 Acute vaginitis: Secondary | ICD-10-CM

## 2023-01-09 ENCOUNTER — Encounter: Payer: Self-pay | Admitting: Internal Medicine

## 2023-01-09 DIAGNOSIS — R1030 Lower abdominal pain, unspecified: Secondary | ICD-10-CM

## 2023-01-09 DIAGNOSIS — R140 Abdominal distension (gaseous): Secondary | ICD-10-CM

## 2023-01-09 DIAGNOSIS — K449 Diaphragmatic hernia without obstruction or gangrene: Secondary | ICD-10-CM

## 2023-01-12 NOTE — Progress Notes (Unsigned)
Heidi Amy, PA-C [address]  [address]  [place], [place] [address]  Main: [phone]  Fax: [phone]   Gastroenterology Consultation  Referring Provider:     Lorre Munroe, NP Primary Care Physician:  Lorre Munroe, NP Primary Gastroenterologist:  Dr. Wyline Mood  Reason for Consultation:     Abdominal Pain, Bloating, and Hiatal Hernia        HPI:   Heidi Woods is a 42 y.o. y/o female referred for consultation & management  by Dr. Sampson Si, Salvadore Oxford, NP.  Evaluate lower abdominal pain, bloating, small hiatal hernia.  No previous GI evaluation or procedures.  Patient generalized episodes of upper and lower abdominal pain for over 1 year.  She has associated abdominal bloating, swelling, and episodes of diarrhea.  She has had episodes of nausea and vomiting for 1.5 months.  Previous cholecystectomy and umbilical hernia repair in 2012.  Her abdominal pain can be located in the epigastrium, RUQ, and bilateral lower abdomen.  Intermittent cramping.  Denies melena.  Had 1 eipisode of bright red rectal bleeding in the past month.  Denies heartburn or dysphagia.  Labs 12/02/22: Normal CMP and TSH.   H. Pylori Breath Test Negative.  CBC showed Moderate Microcytic Anemia with Hgb 8.2g, Hct 26, MCV 66.  She has history of chronic anemia, Hemoglobin C and O Trait.  Saw hematologist in 2015 and 2017 for anemia.  Recently started on Oral Iron.  Hx of Menorrhagia.  Recent Abd / Pelvic CT with contrast 01/06/23 showed:  1. No acute abnormality in the abdomen or pelvis. 2. Pedunculated uterine fibroid measuring 4.5 x 3.3 cm extends from the posterior uterus a stalk measuring 16 mm in thickness. 3. Small hiatal hernia.   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 Heterozygotes are clinically asymptomatic with no hematologic abnormalities, while those homozygous for HbO-Arab may have a mild, asymptomatic, compensated hemolytic anemia.  Hemoglobin O-Arab is of clinical    LGSIL (low grade squamous intraepithelial lesion) on Pap smear 04/05/2013   Also had abnormal pap smear at age 24-22, had normal colpo and paps until 2014   Trichomonal vaginitis 06/22/2020    Past Surgical History:  Procedure Laterality Date   CHOLECYSTECTOMY  2012   INDUCED ABORTION     x 7   UMBILICAL HERNIA REPAIR      Prior to Admission medications   Medication Sig Start Date End Date Taking? Authorizing Provider  albuterol (VENTOLIN HFA) 108 (90 Base) MCG/ACT inhaler Inhale 2 puffs into the lungs every 6 (six) hours as needed for wheezing or shortness of breath. 07/07/22   Waldon Merl, PA-C  Cholecalciferol (VITAMIN D3) 1.25 MG (50000 UT) CAPS TAKE 1 CAPSULE BY MOUTH ONCE A WEEK 03/15/20   Anyanwu, Jethro Bastos, MD  famotidine (PEPCID) 20 MG tablet Take 1 tablet (20 mg total) by mouth daily as needed for up to 10 days for heartburn or indigestion. 07/10/21 07/20/21  Eden Emms, NP  fluconazole (DIFLUCAN) 150 MG tablet Take 1 tablet (150 mg total) by mouth every 3 (three) days. For three doses 12/03/22  Anyanwu, Jethro Bastos, MD  naproxen sodium (ANAPROX DS) 550 MG tablet Take 1 tablet (550 mg total) by mouth 2 (two) times daily with a meal. As needed during periods or for pain 11/26/21   Anyanwu, Jethro Bastos, MD  nystatin-triamcinolone (MYCOLOG II) cream APPLY TO AFFECTED AREA TWICE A DAY 01/09/23   Anyanwu, Jethro Bastos, MD  tranexamic acid (LYSTEDA) 650 MG TABS tablet Take 2 tablets (1,300 mg total) by mouth 3 (three) times daily. Take during menses for a maximum of five days Patient not taking: Reported on 12/02/2022 11/26/21   Tereso Newcomer, MD     Family History  Problem Relation Age of Onset   Migraines Mother    Sickle cell anemia Father    Prostate cancer Father    Throat cancer Maternal Uncle    Breast cancer Maternal Grandmother    Hypertension Maternal Grandmother    Cancer Maternal Grandmother        colon   Prostate cancer Maternal Grandfather    Hypertension Maternal Grandfather    Cancer Paternal Grandfather        throat, lung     Social History   Tobacco Use   Smoking status: Never   Smokeless tobacco: Never  Vaping Use   Vaping Use: Never used  Substance Use Topics   Alcohol use: No   Drug use: No    Allergies as of 01/13/2023   (No Known Allergies)    Review of Systems:    All systems reviewed and negative except where noted in HPI.   Physical Exam:  BP 135/88   Pulse 94   Temp 98.2 F (36.8 C)   Ht 5' 8.5" (1.74 m)   Wt 204 lb 12.8 oz (92.9 kg)   LMP 12/15/2022   BMI 30.69 kg/m  Patient's last menstrual period was 12/15/2022. Psych:  Alert and cooperative. Normal mood and affect. General:   Alert,  Well-developed, well-nourished, pleasant and cooperative in NAD Head:  Normocephalic and atraumatic. Eyes:  Sclera clear, no icterus.   Conjunctiva pink. Ears:  Normal auditory acuity. Lungs:  Respirations even and unlabored.  Clear throughout to auscultation.   No wheezes, crackles, or rhonchi. No acute distress. Heart:  Regular rate and rhythm; no murmurs, clicks, rubs, or gallops. Abdomen:  Normal bowel sounds.  No bruits.  Soft, non-tender and non-distended without masses, hepatosplenomegaly or hernias noted.  No guarding or rebound tenderness.    Neurologic:  Alert and oriented x3;  grossly normal neurologically. Psych:  Alert and cooperative. Normal mood and affect.  Imaging Studies: MM 3D SCREENING MAMMOGRAM BILATERAL BREAST  Result Date: 01/12/2023 CLINICAL DATA:  Screening. EXAM: DIGITAL SCREENING BILATERAL MAMMOGRAM WITH TOMOSYNTHESIS AND CAD TECHNIQUE: Bilateral screening  digital craniocaudal and mediolateral oblique mammograms were obtained. Bilateral screening digital breast tomosynthesis was performed. The images were evaluated with computer-aided detection. COMPARISON:  Previous exam(s). ACR Breast Density Category c: The breasts are heterogeneously dense, which may obscure small masses. FINDINGS: In the right breast , a possible asymmetry requires further evaluation. This possible asymmetry is seen within the slightly outer RIGHT breast, cc slice 40. In the left breast , a possible asymmetry requires further evaluation. This possible asymmetry is seen within the inner central LEFT breast, cc slice 33. IMPRESSION: Further evaluation is suggested for possible asymmetry in the right breast. Further evaluation is suggested for possible asymmetry in the left breast. RECOMMENDATION: Diagnostic mammogram and possibly ultrasound of both breasts. (Code:[id number]-B-50M) The patient will be contacted regarding the findings,  and additional imaging will be scheduled. BI-RADS CATEGORY  0: Incomplete: Need additional imaging evaluation. Electronically Signed   By: Bary Richard M.D.   On: 01/12/2023 11:39   CT Abdomen Pelvis W Contrast  Result Date: 01/09/2023 CLINICAL DATA:  Lower abdominal pain and bloating. EXAM: CT ABDOMEN AND PELVIS WITH CONTRAST TECHNIQUE: Multidetector CT imaging of the abdomen and pelvis was performed using the standard protocol following bolus administration of intravenous contrast. RADIATION DOSE REDUCTION: This exam was performed according to the departmental dose-optimization program which includes automated exposure control, adjustment of the mA and/or kV according to patient size and/or use of iterative reconstruction technique. CONTRAST:  OMNIPAQUE IOHEXOL 300 MG/ML  SOLN COMPARISON:  Pelvic ultrasound December 09, 2022 FINDINGS: Lower chest: No acute abnormality. Hepatobiliary: No suspicious hepatic lesion. Gallbladder surgically absent. No biliary ductal  dilation. No Pancreas: Hepatic ductal dilation or evidence of acute inflammation. Spleen: No splenomegaly. Adrenals/Urinary Tract: Bilateral adrenal glands appear normal. No hydronephrosis. Kidneys demonstrate symmetric enhancement. Urinary bladder is unremarkable for degree of distension. Stomach/Bowel: No radiopaque enteric contrast material was administered. Small hiatal hernia. No pathologic dilation of small or large bowel. Normal appendix. No evidence of acute bowel inflammation. Vascular/Lymphatic: Normal caliber abdominal aorta. Smooth IVC contours. No pathologically enlarged abdominal or pelvic lymph nodes. Reproductive: Pedunculated uterine fibroid measuring 4.5 x 3.3 cm extends from the posterior uterus with a 16 mm stalk on image 65/2. Other: Trace pelvic free fluid is within physiologic normal limits. Musculoskeletal: No acute osseous abnormality. IMPRESSION: 1. No acute abnormality in the abdomen or pelvis. 2. Pedunculated uterine fibroid measuring 4.5 x 3.3 cm extends from the posterior uterus a stalk measuring 16 mm in thickness. 3. Small hiatal hernia. Electronically Signed   By: Maudry Mayhew M.D.   On: 01/09/2023 05:46    Assessment and Plan:   Natania Finigan is a 42 y.o. y/o female has been referred for Anemia, Abdominal Pain, Diarrhea, N/V.  Anemia, Iron Deficiency: DDx: due to Menorrhagia, IBD, Celiac, Gastritis, PUD.  Schedule EGD / Colon to evlauate for sources of Iron Deficiency Anemia.  Suspect IDA is due to Menorrhagia.  Continue Oral Iron daily.  Labs: Celiac, TTG, IgA, CBC, Iron Panal, Ferritin, B12. I have discussed risks & benefits of the procedure  which include, but are not limited to, bleeding, infection, perforation,respiratory compromise & drug reaction.  The patient agrees with this plan & written consent will be obtained.      Abdominal Pain: DDx: IBS, IBD, Gastritis, PUD, Endometriosis. Reassurance reagarding recent abd / pelvic CT results. Scheduling EGD to  evaluate upper abdominal pain. Scheduling Colonoscopy to evaluate lower abd pain.  Diarrhea: DDx:  IBS, IBD, Celiac, Microscopic Colitis. Checking Celiac Labs. Scheduling Colonoscopy.  4.   Family history of Colon Cancer Magernal Grandmother age 68's.  5.   Menorrhagia / Uterine Fibroids  Continue to f/u with GYN.  Follow up in 4 weeks after EGD/Colon.  Heidi Amy, PA-C

## 2023-01-13 ENCOUNTER — Other Ambulatory Visit: Payer: Self-pay

## 2023-01-13 ENCOUNTER — Encounter: Payer: Self-pay | Admitting: Physician Assistant

## 2023-01-13 ENCOUNTER — Encounter: Payer: Self-pay | Admitting: Obstetrics & Gynecology

## 2023-01-13 ENCOUNTER — Ambulatory Visit (INDEPENDENT_AMBULATORY_CARE_PROVIDER_SITE_OTHER): Payer: Federal, State, Local not specified - PPO | Admitting: Physician Assistant

## 2023-01-13 ENCOUNTER — Other Ambulatory Visit: Payer: Self-pay | Admitting: Obstetrics & Gynecology

## 2023-01-13 VITALS — BP 135/88 | HR 94 | Temp 98.2°F | Ht 68.5 in | Wt 204.8 lb

## 2023-01-13 DIAGNOSIS — R1030 Lower abdominal pain, unspecified: Secondary | ICD-10-CM

## 2023-01-13 DIAGNOSIS — D649 Anemia, unspecified: Secondary | ICD-10-CM

## 2023-01-13 DIAGNOSIS — N6489 Other specified disorders of breast: Secondary | ICD-10-CM

## 2023-01-13 DIAGNOSIS — R928 Other abnormal and inconclusive findings on diagnostic imaging of breast: Secondary | ICD-10-CM

## 2023-01-13 MED ORDER — PEG 3350-KCL-NA BICARB-NACL 420 G PO SOLR: 4000.0000 mL | Freq: Once | ORAL | 0 refills | Status: AC

## 2023-01-14 LAB — CELIAC AB TTG DGP TIGA
Antigliadin Abs, IgA: 5 units (ref 0–19)
IgA/Immunoglobulin A, Serum: 377 mg/dL — ABNORMAL HIGH (ref 87–352)

## 2023-01-14 LAB — CBC
Hematocrit: 24.3 % — ABNORMAL LOW (ref 34.0–46.6)
Hemoglobin: 7.6 g/dL — ABNORMAL LOW (ref 11.1–15.9)
MCH: 19.7 pg — ABNORMAL LOW (ref 26.6–33.0)
MCHC: 31.3 g/dL — ABNORMAL LOW (ref 31.5–35.7)
MCV: 63 fL — ABNORMAL LOW (ref 79–97)
Platelets: 226 10*3/uL (ref 150–450)

## 2023-01-14 LAB — VITAMIN B12

## 2023-01-14 LAB — IRON,TIBC AND FERRITIN PANEL: Total Iron Binding Capacity: 319 ug/dL (ref 250–450)

## 2023-01-15 LAB — CELIAC AB TTG DGP TIGA
Gliadin IgG: 9 units (ref 0–19)
Tissue Transglut Ab: 7 U/mL — ABNORMAL HIGH (ref 0–5)
Transglutaminase IgA: <2 U/mL (ref 0–3)

## 2023-01-15 LAB — CBC
RBC: 3.85 x10E6/uL (ref 3.77–5.28)
RDW: 21.2 % — ABNORMAL HIGH (ref 11.7–15.4)
WBC: 5.1 10*3/uL (ref 3.4–10.8)

## 2023-01-15 LAB — IRON,TIBC AND FERRITIN PANEL
Ferritin: 9 ng/mL — ABNORMAL LOW (ref 15–150)
Iron Saturation: 9 % — CL (ref 15–55)
Iron: 28 ug/dL (ref 27–159)
UIBC: 291 ug/dL (ref 131–425)

## 2023-01-15 NOTE — Progress Notes (Signed)
Lab work is negative for celiac disease.

## 2023-01-20 ENCOUNTER — Other Ambulatory Visit: Payer: Self-pay

## 2023-01-20 ENCOUNTER — Ambulatory Visit
Admission: RE | Admit: 2023-01-20 | Discharge: 2023-01-20 | Disposition: A | Discharge status: Home / Self Care | Payer: Federal, State, Local not specified - PPO | Source: Ambulatory Visit | Attending: Obstetrics & Gynecology | Admitting: Obstetrics & Gynecology

## 2023-01-20 DIAGNOSIS — R928 Other abnormal and inconclusive findings on diagnostic imaging of breast: Secondary | ICD-10-CM

## 2023-01-20 DIAGNOSIS — N6489 Other specified disorders of breast: Secondary | ICD-10-CM | POA: Insufficient documentation

## 2023-01-20 DIAGNOSIS — R92333 Mammographic heterogeneous density, bilateral breasts: Secondary | ICD-10-CM | POA: Diagnosis not present

## 2023-02-03 ENCOUNTER — Encounter: Payer: Self-pay | Admitting: Gastroenterology

## 2023-02-03 ENCOUNTER — Ambulatory Visit: Payer: Federal, State, Local not specified - PPO | Admitting: Anesthesiology

## 2023-02-03 ENCOUNTER — Ambulatory Visit
Admission: RE | Admit: 2023-02-03 | Discharge: 2023-02-03 | Disposition: A | Discharge status: Home / Self Care | Payer: Federal, State, Local not specified - PPO | Attending: Gastroenterology | Admitting: Gastroenterology

## 2023-02-03 ENCOUNTER — Encounter
Admission: RE | Disposition: A | Discharge status: Home / Self Care | Payer: Self-pay | Source: Home / Self Care | Attending: Gastroenterology

## 2023-02-03 DIAGNOSIS — D649 Anemia, unspecified: Secondary | ICD-10-CM | POA: Diagnosis present

## 2023-02-03 DIAGNOSIS — D509 Iron deficiency anemia, unspecified: Secondary | ICD-10-CM | POA: Insufficient documentation

## 2023-02-03 DIAGNOSIS — R1030 Lower abdominal pain, unspecified: Secondary | ICD-10-CM | POA: Diagnosis not present

## 2023-02-03 DIAGNOSIS — Z79899 Other long term (current) drug therapy: Secondary | ICD-10-CM | POA: Insufficient documentation

## 2023-02-03 DIAGNOSIS — R768 Other specified abnormal immunological findings in serum: Secondary | ICD-10-CM | POA: Insufficient documentation

## 2023-02-03 HISTORY — PX: COLONOSCOPY WITH PROPOFOL: SHX5780

## 2023-02-03 HISTORY — PX: ESOPHAGOGASTRODUODENOSCOPY (EGD) WITH PROPOFOL: SHX5813

## 2023-02-03 LAB — POCT PREGNANCY, URINE: Preg Test, Ur: NEGATIVE

## 2023-02-03 SURGERY — COLONOSCOPY WITH PROPOFOL
Anesthesia: General

## 2023-02-03 MED ORDER — DEXMEDETOMIDINE HCL IN NACL 200 MCG/50ML IV SOLN
INTRAVENOUS | Status: DC | PRN
  Administered 2023-02-03: 8 ug via INTRAVENOUS
  Administered 2023-02-03: 12 ug via INTRAVENOUS

## 2023-02-03 MED ORDER — PROPOFOL 500 MG/50ML IV EMUL
INTRAVENOUS | Status: DC | PRN
  Administered 2023-02-03: 150 ug/kg/min via INTRAVENOUS

## 2023-02-03 MED ORDER — SODIUM CHLORIDE 0.9 % IV SOLN
INTRAVENOUS | Status: DC
  Administered 2023-02-03: 1000 mL via INTRAVENOUS

## 2023-02-03 MED ORDER — PROPOFOL 10 MG/ML IV BOLUS
INTRAVENOUS | Status: DC | PRN
  Administered 2023-02-03: 30 mg via INTRAVENOUS
  Administered 2023-02-03: 80 mg via INTRAVENOUS
  Administered 2023-02-03: 30 mg via INTRAVENOUS

## 2023-02-03 MED ORDER — LIDOCAINE HCL (CARDIAC) PF 100 MG/5ML IV SOSY
PREFILLED_SYRINGE | INTRAVENOUS | Status: DC | PRN
  Administered 2023-02-03: 50 mg via INTRAVENOUS

## 2023-02-03 NOTE — Op Note (Signed)
[facility] Gastroenterology Patient Name: Heidi Woods Procedure Date: 02/03/2023 10:10 AM MRN: [id number] Account #: [id number] Date of Birth: 10-May-1981 Admit Type: Outpatient Age: 42 Room: [facility] ENDO ROOM 2 Gender: Female Note Status: Finalized Instrument Name: Upper Endoscope [id number] Procedure:             Upper GI endoscopy Indications:           Iron deficiency anemia, Positive celiac serologies Providers:             Wyline Mood MD, MD Referring MD:          Lorre Munroe (Referring MD) Medicines:             Monitored Anesthesia Care Complications:         No immediate complications. Procedure:             Pre-Anesthesia Assessment:                        - Prior to the procedure, a History and Physical was                         performed, and patient medications, allergies and                         sensitivities were reviewed. The patient's tolerance                         of previous anesthesia was reviewed.                        - The risks and benefits of the procedure and the                         sedation options and risks were discussed with the                         patient. All questions were answered and informed                         consent was obtained.                        - ASA Grade Assessment: II - A patient with mild                         systemic disease.                        After obtaining informed consent, the endoscope was                         passed under direct vision. Throughout the procedure,                         the patient's blood pressure, pulse, and oxygen                         saturations were monitored continuously. The Endoscope  was introduced through the mouth, and advanced to the                         third part of duodenum. The upper GI endoscopy was                         accomplished with ease. The patient tolerated the                         procedure  well. Findings:      The esophagus was normal.      The examined duodenum was normal. Biopsies for histology were taken with       a cold forceps for evaluation of celiac disease.      The gastroesophageal flap valve was visualized endoscopically and       classified as Hill Grade III (minimal fold, loose to endoscope, hiatal       hernia likely).      The exam was otherwise without abnormality. Impression:            - Normal esophagus.                        - Normal examined duodenum. Biopsied.                        - Gastroesophageal flap valve classified as Hill Grade                         III (minimal fold, loose to endoscope, hiatal hernia                         likely).                        - The examination was otherwise normal. Recommendation:        - Await pathology results.                        - Perform a colonoscopy today. Procedure Code(s):     --- Professional ---                        [phone], Esophagogastroduodenoscopy, flexible,                         transoral; with biopsy, single or multiple Diagnosis Code(s):     --- Professional ---                        D50.9, Iron deficiency anemia, unspecified                        R76.8, Other specified abnormal immunological findings                         in serum CPT copyright 2022 American Medical Association. All rights reserved. The codes documented in this report are preliminary and upon coder review may  be revised to meet current compliance requirements. Wyline Mood, MD Wyline Mood MD, MD 02/03/2023 10:20:00 AM This report has been signed electronically. Number of Addenda: 0 Note Initiated On: 02/03/2023  10:10 AM Estimated Blood Loss:  Estimated blood loss: none.      [facility]

## 2023-02-03 NOTE — H&P (Signed)
Wyline Mood, MD [address], [address], [place], [place], [address] [address], [address], [place], [place], [address] Phone: [phone]  Fax: [phone]  Primary Care Physician:  Lorre Munroe, NP   Pre-Procedure History & Physical: HPI:  Tyreka Szeliga is a 42 y.o. 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 Heterozygotes are clinically asymptomatic with no hematologic abnormalities, while those homozygous for HbO-Arab may have a mild, asymptomatic, compensated hemolytic anemia.  Hemoglobin O-Arab is of clinical    LGSIL (low grade squamous intraepithelial lesion) on Pap smear 04/05/2013   Also had abnormal pap smear at age 89-22, had normal colpo and paps until 2014   Trichomonal vaginitis 06/22/2020    Past Surgical History:  Procedure Laterality Date   CHOLECYSTECTOMY  2012   INDUCED ABORTION     x 7   UMBILICAL HERNIA REPAIR      Prior to Admission medications   Medication Sig Start Date End Date Taking? Authorizing Provider  albuterol (VENTOLIN HFA) 108 (90 Base) MCG/ACT inhaler Inhale 2 puffs into the lungs every 6 (six) hours as needed for wheezing or shortness of breath. 07/07/22   Waldon Merl, PA-C  Cholecalciferol (VITAMIN D3) 1.25 MG (50000 UT) CAPS TAKE 1 CAPSULE BY MOUTH ONCE A WEEK 03/15/20   Anyanwu, Jethro Bastos, MD  famotidine (PEPCID) 20 MG tablet Take 1 tablet (20 mg total) by mouth daily as needed for up to 10 days for heartburn or indigestion. 07/10/21 07/20/21  Eden Emms, NP  fluconazole (DIFLUCAN) 150 MG tablet Take 1 tablet (150 mg total) by  mouth every 3 (three) days. For three doses 12/03/22   Anyanwu, Jethro Bastos, MD  naproxen sodium (ANAPROX DS) 550 MG tablet Take 1 tablet (550 mg total) by mouth 2 (two) times daily with a meal. As needed during periods or for pain 11/26/21   Anyanwu, Jethro Bastos, MD  nystatin-triamcinolone (MYCOLOG II) cream APPLY TO AFFECTED AREA TWICE A DAY 01/09/23   Anyanwu, Jethro Bastos, MD  tranexamic acid (LYSTEDA) 650 MG TABS tablet Take 2 tablets (1,300 mg total) by mouth 3 (three) times daily. Take during menses for a maximum of five days 11/26/21   Anyanwu, Jethro Bastos, MD    Allergies as of 01/13/2023   (No Known Allergies)    Family History  Problem Relation Age of Onset   Migraines Mother    Sickle cell anemia Father    Prostate cancer Father    Throat cancer Maternal Uncle    Breast cancer Maternal Grandmother    Hypertension Maternal Grandmother    Cancer Maternal Grandmother        colon   Prostate cancer Maternal Grandfather    Hypertension Maternal Grandfather    Cancer Paternal Grandfather        throat, lung    Social History   Socioeconomic History   Marital status: Single    Spouse name: Not on file   Number of children: Not on file   Years of education: Not on file   Highest education  level: Some college, no degree  Occupational History   Not on file  Tobacco Use   Smoking status: Never   Smokeless tobacco: Never  Vaping Use   Vaping Use: Never used  Substance and Sexual Activity   Alcohol use: No   Drug use: No   Sexual activity: Not Currently    Partners: Male  Other Topics Concern   Not on file  Social History Narrative   Not on file   Social Determinants of Health   Financial Resource Strain: Low Risk  (12/25/2022)   Overall Financial Resource Strain (CARDIA)    Difficulty of Paying Living Expenses: Not hard at all  Food Insecurity: No Food Insecurity (12/25/2022)   Hunger Vital Sign    Worried About Running Out of Food in the Last Year: Never true    Ran Out of  Food in the Last Year: Never true  Transportation Needs: No Transportation Needs (12/25/2022)   PRAPARE - Administrator, Civil Service (Medical): No    Lack of Transportation (Non-Medical): No  Physical Activity: Insufficiently Active (12/25/2022)   Exercise Vital Sign    Days of Exercise per Week: 5 days    Minutes of Exercise per Session: 10 min  Stress: No Stress Concern Present (12/25/2022)   [employer] of Occupational Health - Occupational Stress Questionnaire    Feeling of Stress : Only a little  Social Connections: Unknown (12/25/2022)   Social Connection and Isolation Panel [NHANES]    Frequency of Communication with Friends and Family: Once a week    Frequency of Social Gatherings with Friends and Family: Patient declined    Attends Religious Services: More than 4 times per year    Active Member of [employer] or Organizations: Yes    Attends Engineer, structural: More than 4 times per year    Marital Status: Never married  Intimate Partner Violence: Not on file    Review of Systems: See HPI, otherwise negative ROS  Physical Exam: LMP 12/15/2022  General:   Alert,  pleasant and cooperative in NAD Head:  Normocephalic and atraumatic. Neck:  Supple; no masses or thyromegaly. Lungs:  Clear throughout to auscultation, normal respiratory effort.    Heart:  +S1, +S2, Regular rate and rhythm, No edema. Abdomen:  Soft, nontender and nondistended. Normal bowel sounds, without guarding, and without rebound.   Neurologic:  Alert and  oriented x4;  grossly normal neurologically.  Impression/Plan: Storm Frisk is here for an endoscopy and colonoscopy  to be performed for  evaluation of iron deficiency anemia    Risks, benefits, limitations, and alternatives regarding endoscopy have been reviewed with the patient.  Questions have been answered.  All parties agreeable.   Wyline Mood, MD  02/03/2023, 9:18 AM

## 2023-02-03 NOTE — Anesthesia Preprocedure Evaluation (Signed)
Anesthesia Evaluation  Patient identified by MRN, date of birth, ID band Patient awake    Reviewed: Allergy & Precautions, NPO status , Patient's Chart, lab work & pertinent test results  History of Anesthesia Complications Negative for: history of anesthetic complications  Airway Mallampati: III  TM Distance: >3 FB Neck ROM: full    Dental  (+) Chipped   Pulmonary neg pulmonary ROS, neg shortness of breath   Pulmonary exam normal        Cardiovascular Exercise Tolerance: Good (-) angina negative cardio ROS Normal cardiovascular exam     Neuro/Psych negative neurological ROS  negative psych ROS   GI/Hepatic negative GI ROS, Neg liver ROS,neg GERD  ,,  Endo/Other  negative endocrine ROS    Renal/GU negative Renal ROS  negative genitourinary   Musculoskeletal   Abdominal   Peds  Hematology negative hematology ROS (+)   Anesthesia Other Findings Past Medical History: No date: Anemia No date: Goiter 09/24/2013: Hemoglobin C trait (HCC)     Comment:  COMPOUND HETEROZYGOUS FOR HEMOGLOBIN C AND HEMOGLOBIN O               ARAB 05/29/2016: Hemoglobin O Arab trait (HCC)     Comment:  COMPOUND HETEROZYGOUS FOR HEMOGLOBIN C AND HEMOGLOBIN O               ARAB Hemoglobin O-Arab, a beta globin chain mutation, was              first described in an [place] Arab family but its               distribution is widespread. Heterozygotes are clinically               asymptomatic with no hematologic abnormalities, while               those homozygous for HbO-Arab may have a mild,               asymptomatic, compensated hemolytic anemia.  Hemoglobin               O-Arab is of clinical  04/05/2013: LGSIL (low grade squamous intraepithelial lesion) on Pap  smear     Comment:  Also had abnormal pap smear at age 69-22, had normal               colpo and paps until 2014 06/22/2020: Trichomonal vaginitis  Past Surgical  History: 2012: CHOLECYSTECTOMY No date: INDUCED ABORTION     Comment:  x 7 No date: UMBILICAL HERNIA REPAIR     Reproductive/Obstetrics negative OB ROS                             Anesthesia Physical Anesthesia Plan  ASA: 2  Anesthesia Plan: General   Post-op Pain Management:    Induction: Intravenous  PONV Risk Score and Plan: Propofol infusion and TIVA  Airway Management Planned: Natural Airway and Nasal Cannula  Additional Equipment:   Intra-op Plan:   Post-operative Plan:   Informed Consent: I have reviewed the patients History and Physical, chart, labs and discussed the procedure including the risks, benefits and alternatives for the proposed anesthesia with the patient or authorized representative who has indicated his/her understanding and acceptance.     Dental Advisory Given  Plan Discussed with: Anesthesiologist, CRNA and Surgeon  Anesthesia Plan Comments: (Patient consented for risks of anesthesia including but not limited to:  - adverse reactions  to medications - risk of airway placement if required - damage to eyes, teeth, lips or other oral mucosa - nerve damage due to positioning  - sore throat or hoarseness - Damage to heart, brain, nerves, lungs, other parts of body or loss of life  Patient voiced understanding.)       Anesthesia Quick Evaluation

## 2023-02-03 NOTE — Op Note (Signed)
[facility] Patient Name: Heidi Woods Procedure Date: 02/03/2023 10:09 AM MRN: [id number] Account #: [id number] Date of Birth: [birth year] Admit Type: Outpatient Age: 42 Room: [facility] ENDO ROOM 2 Gender: Female Note Status: Finalized Instrument Name: Nelda Marseille [id number] Procedure:             Colonoscopy Indications:           Iron deficiency anemia Providers:             Wyline Mood MD, MD Referring MD:          Lorre Munroe (Referring MD) Medicines:             Monitored Anesthesia Care Complications:         No immediate complications. Procedure:             Pre-Anesthesia Assessment:                        - Prior to the procedure, a History and Physical was                         performed, and patient medications, allergies and                         sensitivities were reviewed. The patient's tolerance                         of previous anesthesia was reviewed.                        - The risks and benefits of the procedure and the                         sedation options and risks were discussed with the                         patient. All questions were answered and informed                         consent was obtained.                        - ASA Grade Assessment: II - A patient with mild                         systemic disease.                        After obtaining informed consent, the colonoscope was                         passed under direct vision. Throughout the procedure,                         the patient's blood pressure, pulse, and oxygen                         saturations were monitored continuously. The                         Colonoscope was introduced through the  anus and                         advanced to the the cecum, identified by the                         appendiceal orifice. The colonoscopy was performed                         with ease. The patient tolerated the procedure well.                          The quality of the bowel preparation was adequate to                         identify polyps. The ileocecal valve, appendiceal                         orifice, and rectum were photographed. Findings:      The entire examined colon appeared normal on direct and retroflexion       views. Impression:            - The entire examined colon is normal on direct and                         retroflexion views.                        - No specimens collected. Recommendation:        - Discharge patient to home (with escort).                        - Resume previous diet.                        - Continue present medications.                        - Return to GI office as previously scheduled. Procedure Code(s):     --- Professional ---                        [phone], Colonoscopy, flexible; diagnostic, including                         collection of specimen(s) by brushing or washing, when                         performed (separate procedure) Diagnosis Code(s):     --- Professional ---                        D50.9, Iron deficiency anemia, unspecified CPT copyright 2022 American Medical Association. All rights reserved. The codes documented in this report are preliminary and upon coder review may  be revised to meet current compliance requirements. Wyline Mood, MD Wyline Mood MD, MD 02/03/2023 10:36:05 AM This report has been signed electronically. Number of Addenda: 0 Note Initiated On: 02/03/2023 10:09 AM Scope Withdrawal Time: 0 hours 7 minutes 58 seconds  Total Procedure Duration: 0 hours 13 minutes 10 seconds  Estimated Blood Loss:  Estimated blood loss: none.      [facility]

## 2023-02-03 NOTE — Anesthesia Postprocedure Evaluation (Signed)
Anesthesia Post Note  Patient: Heidi Woods  Procedure(s) Performed: COLONOSCOPY WITH PROPOFOL ESOPHAGOGASTRODUODENOSCOPY (EGD) WITH PROPOFOL  Patient location during evaluation: Endoscopy Anesthesia Type: General Level of consciousness: awake and alert Pain management: pain level controlled Vital Signs Assessment: post-procedure vital signs reviewed and stable Respiratory status: spontaneous breathing, nonlabored ventilation, respiratory function stable and patient connected to nasal cannula oxygen Cardiovascular status: blood pressure returned to baseline and stable Postop Assessment: no apparent nausea or vomiting Anesthetic complications: no   No notable events documented.   Last Vitals:  Vitals:   02/03/23 1048 02/03/23 1050  BP: 115/74 110/77  Pulse: 81 73  Resp:    Temp:    SpO2: 100% 100%    Last Pain:  Vitals:   02/03/23 1050  TempSrc:   PainSc: 0-No pain                 Cleda Mccreedy Petros Ahart

## 2023-02-03 NOTE — Transfer of Care (Signed)
Immediate Anesthesia Transfer of Care Note  Patient: Heidi Woods  Procedure(s) Performed: COLONOSCOPY WITH PROPOFOL ESOPHAGOGASTRODUODENOSCOPY (EGD) WITH PROPOFOL  Patient Location: PACU and Endoscopy Unit  Anesthesia Type:General  Level of Consciousness: drowsy and patient cooperative  Airway & Oxygen Therapy: Patient Spontanous Breathing  Post-op Assessment: Report given to RN and Post -op Vital signs reviewed and stable  Post vital signs: Reviewed and stable  Last Vitals:  Vitals Value Taken Time  BP 99/61 02/03/23 1040  Temp    Pulse 78 02/03/23 1040  Resp 17 02/03/23 1040  SpO2 100 % 02/03/23 1040    Last Pain:  Vitals:   02/03/23 1040  TempSrc:   PainSc: 0-No pain         Complications: No notable events documented.

## 2023-02-04 ENCOUNTER — Encounter: Payer: Self-pay | Admitting: Gastroenterology

## 2023-02-04 ENCOUNTER — Telehealth: Payer: Self-pay

## 2023-02-04 NOTE — Telephone Encounter (Signed)
Patient notified .   Please call and notify patient her vitamin B12 level was a little bit low.  I recommend she start OTC vitamin B12, take 1000 mcg once daily.  One of her celiac test was slightly elevated.  Dr. Tobi Bastos did biopsies to check for celiac during her EGD procedure today.  We will let her know once those biopsy results return. --Celso Amy, PA-C

## 2023-02-04 NOTE — Telephone Encounter (Signed)
lease call and notify patient her vitamin B12 level was a little bit low.  I recommend she start OTC vitamin B12, take 1000 mcg once daily.  One of her celiac test was slightly elevated.  Dr. Tobi Bastos did biopsies to check for celiac during her EGD procedure today.  We will let her know once those biopsy results return.  --Celso Amy, PA-C

## 2023-02-06 LAB — SURGICAL PATHOLOGY

## 2023-02-09 ENCOUNTER — Ambulatory Visit: Payer: Federal, State, Local not specified - PPO | Admitting: Physician Assistant

## 2023-02-10 ENCOUNTER — Encounter: Payer: Self-pay | Admitting: Gastroenterology

## 2023-02-19 ENCOUNTER — Other Ambulatory Visit: Payer: Self-pay

## 2023-02-22 NOTE — Progress Notes (Deleted)
Celso Amy, PA-C [address]  [address]  [place], [place] [address]  Main: [phone]  Fax: [phone]   Primary Care Physician: Lorre Munroe, NP  Primary Gastroenterologist:  Celso Amy, PA-C / Dr. Wyline Mood   No chief complaint on file.   HPI: Heidi Woods is a 42 y.o. female returns for 5-week follow-up of generalized abdominal pain, bloating, diarrhea, nausea, and vomiting.  Previous cholecystectomy and umbilical hernia repair.  Follow-up of chronic microcytic iron deficiency anemia.  Previously saw hematologist in 2015 and 2017 for anemia.  History of menorrhagia.  Taking oral iron.  EGD 02/03/2023 by Dr. Tobi Bastos showed small hiatal hernia, otherwise normal.  Duodenal biopsy negative for celiac. Colonoscopy 02/03/2023 was normal with no polyps. No source for anemia was found on EGD or colonoscopy.  Recent Abd / Pelvic CT with contrast 01/06/23 showed:  1. No acute abnormality in the abdomen or pelvis. 2. Pedunculated uterine fibroid measuring 4.5 x 3.3 cm extends from the posterior uterus a stalk measuring 16 mm in thickness. 3. Small hiatal hernia.   Labs 01/13/2023 showed hemoglobin 7.6, hematocrit 24, MCV 63, low ferritin of 9, iron saturation 9%, total iron 28, low B12 of 232, Her iron was increased to twice daily.  Current Outpatient Medications  Medication Sig Dispense Refill   albuterol (VENTOLIN HFA) 108 (90 Base) MCG/ACT inhaler Inhale 2 puffs into the lungs every 6 (six) hours as needed for wheezing or shortness of breath. 8 g 0   Cholecalciferol (VITAMIN D3) 1.25 MG (50000 UT) CAPS TAKE 1 CAPSULE BY MOUTH ONCE A WEEK 12 capsule 1   famotidine (PEPCID) 20 MG tablet Take 1 tablet (20 mg total) by mouth daily as needed for up to 10 days for heartburn or indigestion. 10 tablet 0   fluconazole (DIFLUCAN) 150 MG tablet Take 1 tablet (150 mg total) by mouth every 3 (three) days. For three doses (Patient not taking: Reported on 02/03/2023) 3 tablet 3   naproxen  sodium (ANAPROX DS) 550 MG tablet Take 1 tablet (550 mg total) by mouth 2 (two) times daily with a meal. As needed during periods or for pain 30 tablet 2   nystatin-triamcinolone (MYCOLOG II) cream APPLY TO AFFECTED AREA TWICE A DAY 30 g 3   polyethylene glycol-electrolytes (NULYTELY) 420 g solution Take 4,000 mLs by mouth once.     tranexamic acid (LYSTEDA) 650 MG TABS tablet Take 2 tablets (1,300 mg total) by mouth 3 (three) times daily. Take during menses for a maximum of five days 30 tablet 2   No current facility-administered medications for this visit.    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 Heterozygotes are clinically asymptomatic with no hematologic abnormalities, while those homozygous for HbO-Arab may have a mild, asymptomatic, compensated hemolytic anemia.  Hemoglobin O-Arab is of clinical    LGSIL (low grade squamous intraepithelial lesion) on Pap smear 04/05/2013   Also had abnormal pap smear at age 42-22, had normal colpo and paps until 2014   Trichomonal vaginitis 06/22/2020    Past  Surgical History:  Procedure Laterality Date   CHOLECYSTECTOMY  2012   COLONOSCOPY WITH PROPOFOL N/A 02/03/2023   Procedure: COLONOSCOPY WITH PROPOFOL;  Surgeon: Wyline Mood, MD;  Location: [facility] ENDOSCOPY;  Service: Gastroenterology;  Laterality: N/A;   ESOPHAGOGASTRODUODENOSCOPY (EGD) WITH PROPOFOL N/A 02/03/2023   Procedure: ESOPHAGOGASTRODUODENOSCOPY (EGD) WITH PROPOFOL;  Surgeon: Wyline Mood, MD;  Location: [facility] ENDOSCOPY;  Service: Gastroenterology;  Laterality: N/A;   HERNIA REPAIR      INDUCED ABORTION     x 7   UMBILICAL HERNIA REPAIR      Review of Systems:    All systems reviewed and negative except where noted in HPI.   Physical Examination:   There were no vitals taken for this visit.  General: Well-nourished, well-developed in no acute distress.  Eyes: No icterus. Conjunctivae pink. Mouth: Oropharyngeal mucosa moist and pink , no lesions erythema or exudate. Lungs: Clear to auscultation bilaterally. Non-labored. Heart: Regular rate and rhythm, no murmurs rubs or gallops.  Abdomen: Bowel sounds are normal; Abdomen is Soft; No hepatosplenomegaly, masses or hernias;  No Abdominal Tenderness; No guarding or rebound tenderness. Extremities: No lower extremity edema. No clubbing or deformities. Neuro: Alert and oriented x 3.  Grossly intact. Skin: Warm and dry, no jaundice.   Psych: Alert and cooperative, normal mood and affect.   Imaging Studies: No results found.  Assessment and Plan:   Heidi Woods is a 42 y.o. y/o female returns for follow-up of iron deficiency anemia.  Recent EGD and colonoscopy showed no source for anemia.  Biopsies negative for celiac.  She is taking iron tablet twice daily.  I am repeating labs.  She may need IV iron infusion and repeat hematology evaluation.  Also ordering capsule endoscopy to complete her GI workup.  Iron deficiency anemia Labs: CBC, iron panel, ferritin Currently on iron iron twice daily If iron is still low, then refer to hematology for IV iron infusion Schedule capsule endoscopy  B12 deficiency  Start OTC vitamin B12 2000 mcg once daily.  3.   Menorrhagia / uterine fibroids  Follow-up with GYN    Celso Amy, PA-C  Follow up in 3 to 6 months  BP check ***

## 2023-02-23 ENCOUNTER — Ambulatory Visit: Payer: Federal, State, Local not specified - PPO | Admitting: Physician Assistant

## 2023-02-23 ENCOUNTER — Encounter: Payer: Self-pay | Admitting: Physician Assistant

## 2023-02-23 ENCOUNTER — Telehealth: Payer: Self-pay

## 2023-02-23 ENCOUNTER — Telehealth: Payer: Self-pay | Admitting: Physician Assistant

## 2023-02-23 DIAGNOSIS — D509 Iron deficiency anemia, unspecified: Secondary | ICD-10-CM

## 2023-02-23 DIAGNOSIS — E538 Deficiency of other specified B group vitamins: Secondary | ICD-10-CM

## 2023-02-23 NOTE — Telephone Encounter (Signed)
Left detailed message asking patient to return call to office to reschedule her appointment so we can recheck labs.   Pt. No showed appt. Today.  F/U for Anemia.  Last lab 01/13/23 showed very low Hemoglobin 7.6g.  Recent EGD & Colonoscopy unrevealing.  I recommend: 1.) Reshedule f/u OV.    2.) Schedule lab visit next available to check CBC, Iron panal, Ferritin and B12 (Dx: Anemia).   Thank you,  Celso Amy, PA-C

## 2023-02-23 NOTE — Telephone Encounter (Signed)
Telephone follow-up.  Patient no-show appointment today.  She needs lab visit to check CBC, iron panel, ferritin, vitamin B12.  Diagnosis iron deficiency and B12 deficiency anemia.

## 2023-02-24 NOTE — Telephone Encounter (Signed)
Patient called back returning your call and said she was really sick yesterday and could not make it to the appointment. She states she will reschedule the appointment. Offered her 03/12/2023 at 1:30 and she said she need a Monday or Tuesday due to Work. Schedule her for 03/23/2023 at 1:00. She then asked if her symptoms were getting worse then could she call and make a emergency appointment. Informed her I offered her the appointment on 03/12/23 and she declined the appointment. She states she will just go to the ER. Tried to ask patient what symptoms were getting worse and she disconnected the phone.

## 2023-03-22 NOTE — Progress Notes (Unsigned)
Celso Amy, PA-C [address]  [address]  [place], [place] [address]  Main: [phone]  Fax: [phone]   Primary Care Physician: Lorre Munroe, NP  Primary Gastroenterologist:  Celso Amy, PA-C / Dr. Wyline Mood    CC: Follow-up iron deficiency anemia, abdominal pain, diarrhea  HPI: Heidi Woods is a 42 y.o. female returns for follow-up of chronic iron deficiency anemia, abdominal pain, and diarrhea.  She has history of menorrhagia and uterine fibroids.  Family history of colon cancer maternal grandmother age 47s.  She has history of chronic anemia, Hemoglobin C and O Trait. Saw hematologist in 2015 and 2017 for anemia.  Currently taking OTC oral Iron tablet once daily and vitamin B12 supplement daily.  EGD 02/03/2023 showed biopsies negative for celiac.  There was a hiatal hernia, otherwise normal.  Colonoscopy 02/03/2023 was normal.  Good prep.  Labs 01/13/2023 showed iron deficiency anemia with hemoglobin 7.6, hematocrit 24, MCV 63, total iron 28, iron saturation 9%, ferritin 9.  Tissue trans glutaminase antibody weak positive.  All other celiac labs negative.  Low vitamin B12 232.  Abd / Pelvic CT with contrast 01/06/23 showed:  1. No acute abnormality in the abdomen or pelvis. 2. Pedunculated uterine fibroid measuring 4.5 x 3.3 cm extends from the posterior uterus a stalk measuring 16 mm in thickness. 3. Small hiatal hernia.   Current symptoms: She still feels like her abdomen is obese and swollen.  No recent episodes of diarrhea or abdominal pain.  No other GI symptoms at this time.  She denies melena or hematochezia.  Current Outpatient Medications  Medication Sig Dispense Refill   albuterol (VENTOLIN HFA) 108 (90 Base) MCG/ACT inhaler Inhale 2 puffs into the lungs every 6 (six) hours as needed for wheezing or shortness of breath. 8 g 0   Cholecalciferol (VITAMIN D3) 1.25 MG (50000 UT) CAPS TAKE 1 CAPSULE BY MOUTH ONCE A WEEK 12 capsule 1   cyanocobalamin (VITAMIN  B12) 500 MCG tablet Take 500 mcg by mouth daily.     famotidine (PEPCID) 20 MG tablet Take 1 tablet (20 mg total) by mouth daily as needed for up to 10 days for heartburn or indigestion. 10 tablet 0   ferrous sulfate 324 MG TBEC Take 324 mg by mouth daily.     fluconazole (DIFLUCAN) 150 MG tablet Take 1 tablet (150 mg total) by mouth every 3 (three) days. For three doses 3 tablet 3   naproxen sodium (ANAPROX DS) 550 MG tablet Take 1 tablet (550 mg total) by mouth 2 (two) times daily with a meal. As needed during periods or for pain 30 tablet 2   nystatin-triamcinolone (MYCOLOG II) cream APPLY TO AFFECTED AREA TWICE A DAY 30 g 3   polyethylene glycol-electrolytes (NULYTELY) 420 g solution Take 4,000 mLs by mouth once.     tranexamic acid (LYSTEDA) 650 MG TABS tablet Take 2 tablets (1,300 mg total) by mouth 3 (three) times daily. Take during menses for a maximum of five days 30 tablet 2   No current facility-administered medications for this visit.    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 Heterozygotes are clinically asymptomatic with no hematologic abnormalities, while those homozygous for HbO-Arab may have a mild, asymptomatic, compensated hemolytic anemia.  Hemoglobin O-Arab is of clinical    LGSIL (low grade squamous intraepithelial lesion) on Pap smear 04/05/2013   Also had abnormal pap smear at age 82-22, had normal colpo and paps until 2014   Trichomonal vaginitis 06/22/2020    Past Surgical History:  Procedure Laterality Date   CHOLECYSTECTOMY  2012   COLONOSCOPY WITH  PROPOFOL N/A 02/03/2023   Procedure: COLONOSCOPY WITH PROPOFOL;  Surgeon: Wyline Mood, MD;  Location: [facility] ENDOSCOPY;  Service: Gastroenterology;  Laterality: N/A;   ESOPHAGOGASTRODUODENOSCOPY (EGD) WITH PROPOFOL N/A 02/03/2023   Procedure: ESOPHAGOGASTRODUODENOSCOPY (EGD) WITH PROPOFOL;  Surgeon: Wyline Mood, MD;  Location: [facility] ENDOSCOPY;  Service: Gastroenterology;  Laterality: N/A;   HERNIA REPAIR     INDUCED ABORTION     x 7   UMBILICAL HERNIA REPAIR      Review of Systems:    All systems reviewed and negative except where noted in HPI.   Physical Examination:   BP 134/86   Pulse 98   Temp 98.8 F (37.1 C)   Ht 5' 8.5" (1.74 m)   Wt 208 lb 3.2 oz (94.4 kg)   LMP 03/05/2023   BMI 31.20 kg/m   General: Well-nourished, well-developed in no acute distress.  Psych: Alert and cooperative, normal mood and affect. No physical exam performed.   Imaging Studies: No results found.  Assessment and Plan:   Heidi Woods is a 42 y.o. y/o female returns for follow-up of iron deficiency anemia.  Recent EGD and colonoscopy were unrevealing.  Most likely due to menorrhagia/uterine fibroids.  I am ordering capsule endoscopy to complete her workup.  1.  Iron deficiency anemia  Lab: CBC, iron panel, ferritin  Capsule endoscopy  She is currently taking iron tablet once daily.  If iron is still low, increase iron to twice daily or refer to hematology for IV iron.  2.  B12 deficiency  Lab: vitamin B12  She is currently taking OTC vitamin B12 supplement daily.  3.  Menorrhagia and uterine fibroids  Follow-up with gynecologist for treatment.   Celso Amy, PA-C  Follow up as needed based on test results and GI symptoms.

## 2023-03-23 ENCOUNTER — Other Ambulatory Visit: Payer: Self-pay

## 2023-03-23 ENCOUNTER — Ambulatory Visit (INDEPENDENT_AMBULATORY_CARE_PROVIDER_SITE_OTHER): Payer: Federal, State, Local not specified - PPO | Admitting: Physician Assistant

## 2023-03-23 ENCOUNTER — Telehealth: Payer: Self-pay

## 2023-03-23 ENCOUNTER — Encounter: Payer: Self-pay | Admitting: Physician Assistant

## 2023-03-23 VITALS — BP 134/86 | HR 98 | Temp 98.8°F | Ht 68.5 in | Wt 208.2 lb

## 2023-03-23 DIAGNOSIS — D509 Iron deficiency anemia, unspecified: Secondary | ICD-10-CM

## 2023-03-23 DIAGNOSIS — E538 Deficiency of other specified B group vitamins: Secondary | ICD-10-CM | POA: Diagnosis not present

## 2023-03-23 NOTE — Telephone Encounter (Signed)
Error

## 2023-03-24 ENCOUNTER — Telehealth: Payer: Self-pay

## 2023-03-24 DIAGNOSIS — E538 Deficiency of other specified B group vitamins: Secondary | ICD-10-CM

## 2023-03-24 DIAGNOSIS — D509 Iron deficiency anemia, unspecified: Secondary | ICD-10-CM

## 2023-03-24 DIAGNOSIS — D649 Anemia, unspecified: Secondary | ICD-10-CM

## 2023-03-24 LAB — IRON,TIBC AND FERRITIN PANEL
Ferritin: 10 ng/mL — ABNORMAL LOW (ref 15–150)
Iron Saturation: 7 % — CL (ref 15–55)
Iron: 27 ug/dL (ref 27–159)
Total Iron Binding Capacity: 406 ug/dL (ref 250–450)
UIBC: 379 ug/dL (ref 131–425)

## 2023-03-24 LAB — VITAMIN B12: Vitamin B-12: 195 pg/mL — ABNORMAL LOW (ref 232–1245)

## 2023-03-24 LAB — CBC
Hematocrit: 26.1 % — ABNORMAL LOW (ref 34.0–46.6)
Hemoglobin: 7.9 g/dL — ABNORMAL LOW (ref 11.1–15.9)
MCH: 18.6 pg — ABNORMAL LOW (ref 26.6–33.0)
MCHC: 30.3 g/dL — ABNORMAL LOW (ref 31.5–35.7)
MCV: 61 fL — ABNORMAL LOW (ref 79–97)
Platelets: 236 10*3/uL (ref 150–450)
RBC: 4.25 x10E6/uL (ref 3.77–5.28)
RDW: 22.7 % — ABNORMAL HIGH (ref 11.7–15.4)
WBC: 5.9 10*3/uL (ref 3.4–10.8)

## 2023-03-24 NOTE — Progress Notes (Signed)
Labs showed persistent iron deficiency and B12 deficiency anemia.  Persistent low hemoglobin 7.9.  Low iron and low B12.  **I recommend: 1.  Continue with plan for capsule endoscopy. 2.  Increase vitamin B12 to 2000 mg daily. 3.  Increase iron tablet to twice daily and take with vitamin C.  Also refer to hematologist to discuss IV iron infusion. 4.  Repeat CBC, iron panel, and vitamin B12 in 2 months. Celso Amy, PA-C

## 2023-03-24 NOTE — Telephone Encounter (Signed)
Notified patient. Referral placed in epic. Patient verbalized all lab results with understanding.  She will come in 2 months around September 9 for the labs.   Labs showed persistent iron deficiency and B12 deficiency anemia.  Persistent low hemoglobin 7.9.  Low iron and low B12.  **I recommend:  1.  Continue with plan for capsule endoscopy.  2.  Increase vitamin B12 to 2000 mg daily.  3.  Increase iron tablet to twice daily and take with vitamin C.  Also refer to hematologist to discuss IV iron infusion.  4.  Repeat CBC, iron panel, and vitamin B12 in 2 months.  Celso Amy, PA-C

## 2023-04-03 ENCOUNTER — Telehealth: Payer: Self-pay

## 2023-04-03 NOTE — Telephone Encounter (Signed)
Called and left a message for call back to rescheduled capsule study that was scheduled for today and we were unable to due due to system outage.

## 2023-04-03 NOTE — Telephone Encounter (Signed)
Tried to call patient back and she answered the call. I asked her if she wanted to reschedule her capsule study since our systems were down and she couldn't do her capsule study test. Patient agreed and stated that she would change it to 05/19/2023. I then called Trish from the endoscopy unit and was able to switch the date. Patient had no further questions.

## 2023-05-19 ENCOUNTER — Encounter
Admission: RE | Disposition: A | Discharge status: Home / Self Care | Payer: Self-pay | Source: Home / Self Care | Attending: Gastroenterology

## 2023-05-19 ENCOUNTER — Ambulatory Visit
Admission: RE | Admit: 2023-05-19 | Discharge: 2023-05-19 | Disposition: A | Discharge status: Home / Self Care | Payer: Federal, State, Local not specified - PPO | Attending: Gastroenterology | Admitting: Gastroenterology

## 2023-05-19 DIAGNOSIS — D500 Iron deficiency anemia secondary to blood loss (chronic): Secondary | ICD-10-CM

## 2023-05-19 DIAGNOSIS — D509 Iron deficiency anemia, unspecified: Secondary | ICD-10-CM | POA: Insufficient documentation

## 2023-05-19 HISTORY — PX: GIVENS CAPSULE STUDY: SHX5432

## 2023-05-19 SURGERY — GIVENS CAPSULE STUDY

## 2023-05-20 ENCOUNTER — Encounter: Payer: Self-pay | Admitting: Gastroenterology

## 2023-06-01 ENCOUNTER — Encounter: Payer: Self-pay | Admitting: Physician Assistant

## 2023-06-22 ENCOUNTER — Ambulatory Visit (INDEPENDENT_AMBULATORY_CARE_PROVIDER_SITE_OTHER): Payer: Federal, State, Local not specified - PPO | Admitting: Physician Assistant

## 2023-06-22 ENCOUNTER — Encounter: Payer: Self-pay | Admitting: Physician Assistant

## 2023-06-22 VITALS — BP 135/85 | HR 97 | Temp 98.3°F | Ht 68.5 in | Wt 209.0 lb

## 2023-06-22 DIAGNOSIS — R110 Nausea: Secondary | ICD-10-CM | POA: Diagnosis not present

## 2023-06-22 DIAGNOSIS — E538 Deficiency of other specified B group vitamins: Secondary | ICD-10-CM | POA: Diagnosis not present

## 2023-06-22 DIAGNOSIS — D509 Iron deficiency anemia, unspecified: Secondary | ICD-10-CM | POA: Diagnosis not present

## 2023-06-22 DIAGNOSIS — K219 Gastro-esophageal reflux disease without esophagitis: Secondary | ICD-10-CM

## 2023-06-22 DIAGNOSIS — R197 Diarrhea, unspecified: Secondary | ICD-10-CM

## 2023-06-22 MED ORDER — PANTOPRAZOLE SODIUM 40 MG PO TBEC
40.0000 mg | DELAYED_RELEASE_TABLET | Freq: Every day | ORAL | 3 refills | Status: DC
Start: 2023-06-22 — End: 2024-06-21

## 2023-06-22 NOTE — Progress Notes (Signed)
Celso Amy, PA-C [address]  [address]  [place], [place] [address]  Main: [phone]  Fax: [phone]   Primary Care Physician: Lorre Munroe, NP  Primary Gastroenterologist:  Celso Amy, PA-C / Dr. Wyline Mood    CC: Follow-up IDA, Nausea, Diarrhea  HPI: Heidi Woods is a 42 y.o. female follow-up of chronic iron deficiency anemia, abdominal pain, nausea and diarrhea.  She has history of menorrhagia and uterine fibroids.  Family history of colon cancer maternal grandmother age 28s.  She has history of chronic anemia, Hemoglobin C and O Trait. Saw hematologist in 2015 and 2017 for anemia.  Currently taking OTC oral Iron tablet once daily and vitamin B12 supplement daily.   EGD 02/03/2023 showed biopsies negative for celiac.  There was a hiatal hernia, otherwise normal.   Colonoscopy 02/03/2023 was normal.  Good prep.  Capsule endoscopy 06/05/2023 was normal.   Labs 03/23/2023 showed low hemoglobin 7.9, hematocrit 26, MCV 61, low ferritin 10, iron saturation 7%, iron 27.  Low vitamin B12 195.  She was told to increase vitamin B12 to 2000 mg daily and increase iron tablet to twice daily with vitamin C.  No recent hematology evaluation.  She states she is taking her vitamin B12 and iron daily.  She admits to taking occasional OTC Advil or Anaprox during her menstrual cycle due to menorrhagia and dysmenorrhea.   Abd / Pelvic CT with contrast 01/06/23 showed:  1. No acute abnormality in the abdomen or pelvis. 2. Pedunculated uterine fibroid measuring 4.5 x 3.3 cm extends from the posterior uterus a stalk measuring 16 mm in thickness. 3. Small hiatal hernia.    Current symptoms: She still feels like her abdomen is obese and swollen.  She has occasional episode of nausea and diarrhea once per week.  Denies melena or hematochezia.  She is not taking Pepcid, H2 RB, or PPI.  Denies unintentional weight loss.  She is frustrated about weight gain.  Current Outpatient Medications   Medication Sig Dispense Refill   albuterol (VENTOLIN HFA) 108 (90 Base) MCG/ACT inhaler Inhale 2 puffs into the lungs every 6 (six) hours as needed for wheezing or shortness of breath. 8 g 0   Cholecalciferol (VITAMIN D3) 1.25 MG (50000 UT) CAPS TAKE 1 CAPSULE BY MOUTH ONCE A WEEK 12 capsule 1   cyanocobalamin (VITAMIN B12) 500 MCG tablet Take 500 mcg by mouth daily.     ferrous sulfate 324 MG TBEC Take 324 mg by mouth daily.     fluconazole (DIFLUCAN) 150 MG tablet Take 1 tablet (150 mg total) by mouth every 3 (three) days. For three doses 3 tablet 3   naproxen sodium (ANAPROX DS) 550 MG tablet Take 1 tablet (550 mg total) by mouth 2 (two) times daily with a meal. As needed during periods or for pain 30 tablet 2   nystatin-triamcinolone (MYCOLOG II) cream APPLY TO AFFECTED AREA TWICE A DAY 30 g 3   pantoprazole (PROTONIX) 40 MG tablet Take 1 tablet (40 mg total) by mouth daily. 90 tablet 3   polyethylene glycol-electrolytes (NULYTELY) 420 g solution Take 4,000 mLs by mouth once.     tranexamic acid (LYSTEDA) 650 MG TABS tablet Take 2 tablets (1,300 mg total) by mouth 3 (three) times daily. Take during menses for a maximum of five days 30 tablet 2   No current facility-administered medications for this visit.    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 Heterozygotes are clinically asymptomatic with no hematologic abnormalities, while those homozygous for HbO-Arab may have a mild, asymptomatic, compensated hemolytic anemia.  Hemoglobin O-Arab is of clinical    LGSIL (low grade squamous  intraepithelial lesion) on Pap smear 04/05/2013   Also had abnormal pap smear at age 47-22, had normal colpo and paps until 2014   Trichomonal vaginitis 06/22/2020    Past Surgical History:  Procedure Laterality Date   CHOLECYSTECTOMY  2012   COLONOSCOPY WITH PROPOFOL N/A 02/03/2023   Procedure: COLONOSCOPY WITH PROPOFOL;  Surgeon: Wyline Mood, MD;  Location: [facility] ENDOSCOPY;  Service: Gastroenterology;  Laterality: N/A;   ESOPHAGOGASTRODUODENOSCOPY (EGD) WITH PROPOFOL N/A 02/03/2023   Procedure: ESOPHAGOGASTRODUODENOSCOPY (EGD) WITH PROPOFOL;  Surgeon: Wyline Mood, MD;  Location: [facility] ENDOSCOPY;  Service: Gastroenterology;  Laterality: N/A;   GIVENS CAPSULE STUDY N/A 05/19/2023   Procedure: GIVENS CAPSULE STUDY;  Surgeon: Wyline Mood, MD;  Location: [facility] ENDOSCOPY;  Service: Gastroenterology;  Laterality: N/A;   HERNIA REPAIR     INDUCED ABORTION     x 7   UMBILICAL HERNIA REPAIR      Review of Systems:    All systems reviewed and negative except where noted in HPI.   Physical Examination:   BP 135/85   Pulse 97   Temp 98.3 F (36.8 C)   Ht 5' 8.5" (1.74 m)   Wt 209 lb (94.8 kg)   BMI 31.32 kg/m   General: Well-nourished, well-developed in no acute distress.  Lungs: Clear to auscultation bilaterally. Non-labored. Heart: Regular rate and rhythm, no murmurs rubs or gallops.  Abdomen: Bowel sounds are normal; Abdomen is Soft; No hepatosplenomegaly, masses or hernias; moderate generalized upper abdominal Tenderness; no lower abdominal tenderness.  No guarding or rebound tenderness. Neuro: Alert and oriented x 3.  Grossly intact.  Psych: Alert and cooperative, normal mood and affect.   Imaging Studies: No results found.  Assessment and Plan:   Heidi Woods is a 42 y.o. y/o female returns for follow-up of chronic iron deficiency and B12 deficiency anemia.  History of menorrhagia.  Recent EGD, colonoscopy, and capsule endoscopy showed no GI source for anemia.  She continues to have  occasional episode of nausea and diarrhea.  I am suspicious for gastritis and IBS.  She had previous cholecystectomy.  Iron deficiency anemia most likely due to menorrhagia and I encouraged her to follow-up with her GYN.  She will also follow-up with PCP to discuss weight loss strategies.  1.  Chronic iron deficiency anemia: GI workup unrevealing  Follow-up with GYN for menorrhagia  Lab today: CBC, ferritin, iron panel  Continue iron tablet twice daily  Monitor labs through PCP in the future  2.  B12 deficiency  Lab: CBC, vitamin B12  Continue vitamin B12, take 2000 mcg daily  3.  GERD  Start Rx pantoprazole 40 mg once daily  GERD diet  4.  IBS-D vs Bile salt diarrhea post cholecystectomy  Low FODMAP Diet  If diarrhea persist, consider Colestid or cholestyramine.  Celso Amy, PA-C  Follow up as needed based on lab results and GI symptoms.

## 2023-06-30 ENCOUNTER — Ambulatory Visit (INDEPENDENT_AMBULATORY_CARE_PROVIDER_SITE_OTHER): Payer: Federal, State, Local not specified - PPO | Admitting: Internal Medicine

## 2023-06-30 ENCOUNTER — Encounter: Payer: Self-pay | Admitting: Internal Medicine

## 2023-06-30 VITALS — BP 126/82 | HR 86 | Ht 68.5 in | Wt 209.0 lb

## 2023-06-30 DIAGNOSIS — D219 Benign neoplasm of connective and other soft tissue, unspecified: Secondary | ICD-10-CM | POA: Diagnosis not present

## 2023-06-30 DIAGNOSIS — K219 Gastro-esophageal reflux disease without esophagitis: Secondary | ICD-10-CM | POA: Insufficient documentation

## 2023-06-30 DIAGNOSIS — Z0001 Encounter for general adult medical examination with abnormal findings: Secondary | ICD-10-CM

## 2023-06-30 DIAGNOSIS — D500 Iron deficiency anemia secondary to blood loss (chronic): Secondary | ICD-10-CM

## 2023-06-30 DIAGNOSIS — E66811 Obesity, class 1: Secondary | ICD-10-CM

## 2023-06-30 DIAGNOSIS — E049 Nontoxic goiter, unspecified: Secondary | ICD-10-CM | POA: Insufficient documentation

## 2023-06-30 DIAGNOSIS — Z6831 Body mass index (BMI) 31.0-31.9, adult: Secondary | ICD-10-CM | POA: Diagnosis not present

## 2023-06-30 DIAGNOSIS — Z0289 Encounter for other administrative examinations: Secondary | ICD-10-CM

## 2023-06-30 DIAGNOSIS — E6609 Other obesity due to excess calories: Secondary | ICD-10-CM | POA: Diagnosis not present

## 2023-06-30 DIAGNOSIS — G43019 Migraine without aura, intractable, without status migrainosus: Secondary | ICD-10-CM

## 2023-06-30 DIAGNOSIS — G43909 Migraine, unspecified, not intractable, without status migrainosus: Secondary | ICD-10-CM | POA: Insufficient documentation

## 2023-06-30 MED ORDER — AMITRIPTYLINE HCL 25 MG PO TABS: 25.0000 mg | ORAL_TABLET | Freq: Every day | ORAL | 1 refills | Status: DC

## 2023-06-30 NOTE — Assessment & Plan Note (Signed)
CBC and iron panel today Continue oral iron and Lysteda for gynecology Advised her to follow-up with GYN to discuss removal of fibroids versus her hysterectomy

## 2023-06-30 NOTE — Assessment & Plan Note (Signed)
Will trial amitriptyline 25 mg nightly-Tatian caution with Okay to continue ibuprofen as needed She will upload a copy of FMLA forms to MyChart for completion

## 2023-06-30 NOTE — Progress Notes (Signed)
Subjective:    Patient ID: Heidi Woods, female    DOB: [birth year], 42 y.o.   MRN: [id number]  HPI  Patient presents to clinic today for her annual exam.  She would also like FMLA form completion for her migraines.  She reports these occur 2-3 times a week.  She thinks they are triggered by stress.  She takes ibuprofen as needed with some relief of symptoms.  She does not follow with neurology.   Flu: 06/2013 Tetanus: 04/2016 COVID: x 2 Pap smear: 11/2022 Mammogram: 01/2023 Vision screening: annually Dentist: biannually  Diet: She does eat meat. She consumes fruits and veggies. She does eat some fried foods. She drinks mostly water and hot tea. 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 Heterozygotes are clinically asymptomatic with no hematologic abnormalities, while those homozygous for HbO-Arab may have a mild, asymptomatic, compensated hemolytic anemia.  Hemoglobin O-Arab is of clinical    LGSIL (low grade squamous intraepithelial lesion) on Pap smear 04/05/2013   Also had abnormal pap smear at age 27-22, had normal colpo and paps until 2014   Trichomonal vaginitis 06/22/2020    Current Outpatient Medications  Medication Sig Dispense Refill   albuterol (VENTOLIN HFA) 108 (90 Base) MCG/ACT inhaler Inhale 2 puffs into the lungs every 6 (six) hours as needed for wheezing or shortness of breath. 8 g 0   Cholecalciferol (VITAMIN D3) 1.25 MG (50000 UT) CAPS TAKE 1 CAPSULE BY MOUTH ONCE A WEEK 12 capsule 1   cyanocobalamin (VITAMIN B12) 500 MCG tablet Take 500 mcg by mouth daily.     ferrous  sulfate 324 MG TBEC Take 324 mg by mouth daily.     fluconazole (DIFLUCAN) 150 MG tablet Take 1 tablet (150 mg total) by mouth every 3 (three) days. For three doses 3 tablet 3   naproxen sodium (ANAPROX DS) 550 MG tablet Take 1 tablet (550 mg total) by mouth 2 (two) times daily with a meal. As needed during periods or for pain 30 tablet 2   nystatin-triamcinolone (MYCOLOG II) cream APPLY TO AFFECTED AREA TWICE A DAY 30 g 3   pantoprazole (PROTONIX) 40 MG tablet Take 1 tablet (40 mg total) by mouth daily. 90 tablet 3   polyethylene glycol-electrolytes (NULYTELY) 420 g solution Take 4,000 mLs by mouth once.     tranexamic acid (LYSTEDA) 650 MG TABS tablet Take 2 tablets (1,300 mg total) by mouth 3 (three) times daily. Take during menses for a maximum of five days 30 tablet 2   No current facility-administered medications for this visit.    No Known Allergies  Family History  Problem Relation Age of Onset   Migraines Mother    Sickle cell anemia Father    Prostate cancer Father    Throat cancer Maternal Uncle    Breast cancer Maternal Grandmother    Hypertension Maternal Grandmother    Cancer Maternal Grandmother        colon   Prostate cancer Maternal Grandfather    Hypertension Maternal Grandfather    Cancer Paternal Grandfather  throat, lung    Social History   Socioeconomic History   Marital status: Single    Spouse name: Not on file   Number of children: Not on file   Years of education: Not on file   Highest education level: Some college, no degree  Occupational History   Not on file  Tobacco Use   Smoking status: Never   Smokeless tobacco: Never  Vaping Use   Vaping status: Never Used  Substance and Sexual Activity   Alcohol use: No   Drug use: No   Sexual activity: Not Currently    Partners: Male  Other Topics Concern   Not on file  Social History Narrative   Not on file   Social Determinants of Health   Financial Resource Strain: Low Risk   (12/25/2022)   Overall Financial Resource Strain (CARDIA)    Difficulty of Paying Living Expenses: Not hard at all  Food Insecurity: No Food Insecurity (12/25/2022)   Hunger Vital Sign    Worried About Running Out of Food in the Last Year: Never true    Ran Out of Food in the Last Year: Never true  Transportation Needs: No Transportation Needs (12/25/2022)   PRAPARE - Administrator, Civil Service (Medical): No    Lack of Transportation (Non-Medical): No  Physical Activity: Insufficiently Active (12/25/2022)   Exercise Vital Sign    Days of Exercise per Week: 5 days    Minutes of Exercise per Session: 10 min  Stress: No Stress Concern Present (12/25/2022)   [employer] of Occupational Health - Occupational Stress Questionnaire    Feeling of Stress : Only a little  Social Connections: Unknown (12/25/2022)   Social Connection and Isolation Panel [NHANES]    Frequency of Communication with Friends and Family: Once a week    Frequency of Social Gatherings with Friends and Family: Patient declined    Attends Religious Services: More than 4 times per year    Active Member of [employer] or Organizations: Yes    Attends Engineer, structural: More than 4 times per year    Marital Status: Never married  Intimate Partner Violence: Not on file     Constitutional: Patient reports intermittent headaches.  Denies fever, malaise, fatigue, or abrupt weight changes.  HEENT: Denies eye pain, eye redness, ear pain, ringing in the ears, wax buildup, runny nose, nasal congestion, bloody nose, or sore throat. Respiratory: Denies difficulty breathing, shortness of breath, cough or sputum production.   Cardiovascular: Denies chest pain, chest tightness, palpitations or swelling in the hands or feet.  Gastrointestinal: Denies abdominal pain, bloating, constipation, diarrhea or blood in the stool.  GU: Patient reports heavy menses.  Denies urgency, frequency, pain with urination, burning  sensation, blood in urine, odor or discharge. Musculoskeletal: Denies decrease in range of motion, difficulty with gait, muscle pain or joint pain and swelling.  Skin: Denies redness, rashes, lesions or ulcercations.  Neurological: Denies dizziness, difficulty with memory, difficulty with speech or problems with balance and coordination.  Psych: Denies anxiety, depression, SI/HI.  No other specific complaints in a complete review of systems (except as listed in HPI above).  Objective:   Physical Exam  BP 126/82   Pulse 86   Ht 5' 8.5" (1.74 m)   Wt 209 lb (94.8 kg)   LMP 06/15/2023   SpO2 97%   BMI 31.32 kg/m   Wt Readings from Last 3 Encounters:  06/22/23 209 lb (94.8 kg)  03/23/23 208 lb  3.2 oz (94.4 kg)  02/03/23 199 lb 12.9 oz (90.6 kg)    General: Appears her stated age, obese, in NAD. Skin: Warm, dry and intact.  HEENT: Head: normal shape and size; Eyes: sclera white, no icterus, conjunctiva pink, PERRLA and EOMs intact;  Neck: Multinodular thyroid goiter noted. Cardiovascular: Normal rate and rhythm. S1,S2 noted.  No murmur, rubs or gallops noted. No JVD or BLE edema. Pulmonary/Chest: Normal effort and positive vesicular breath sounds. No respiratory distress. No wheezes, rales or ronchi noted.  Abdomen: Soft and nontender. Normal bowel sounds.  Musculoskeletal: Strength 5/5 BUE/BLE.  No difficulty with gait.  Neurological: Alert and oriented. Cranial nerves II-XII grossly intact. Coordination normal.  Psychiatric: Mood and affect normal. Behavior is normal. Judgment and thought content normal.     BMET    Component Value Date/Time   NA 144 12/02/2022 1121   NA 136 04/04/2013 0834   K 3.5 12/02/2022 1121   K 3.3 (L) 04/04/2013 0834   CL 108 (H) 12/02/2022 1121   CL 107 04/04/2013 0834   CO2 22 12/02/2022 1121   CO2 23 04/04/2013 0834   GLUCOSE 91 12/02/2022 1121   GLUCOSE 94 10/22/2021 1450   GLUCOSE 104 (H) 04/04/2013 0834   BUN 6 12/02/2022 1121   BUN 11  04/04/2013 0834   CREATININE 0.74 12/02/2022 1121   CREATININE 0.69 10/22/2021 1450   CALCIUM 8.7 12/02/2022 1121   CALCIUM 8.9 04/04/2013 0834   GFRNONAA 95 10/13/2019 0824   GFRNONAA >60 04/04/2013 0834   GFRAA 109 10/13/2019 0824   GFRAA >60 04/04/2013 0834    Lipid Panel     Component Value Date/Time   CHOL 127 12/02/2022 1121   TRIG 96 12/02/2022 1121   HDL 43 12/02/2022 1121   CHOLHDL 3.0 12/02/2022 1121   CHOLHDL 2.9 10/22/2021 1450   LDLCALC 66 12/02/2022 1121   LDLCALC 64 10/22/2021 1450    CBC    Component Value Date/Time   WBC 5.9 03/23/2023 1321   WBC 6.5 10/22/2021 1450   RBC 4.25 03/23/2023 1321   RBC 4.08 10/22/2021 1450   HGB 7.9 (L) 03/23/2023 1321   HCT 26.1 (L) 03/23/2023 1321   PLT 236 03/23/2023 1321   MCV 61 (L) 03/23/2023 1321   MCV 74 (L) 04/04/2013 0834   MCH 18.6 (L) 03/23/2023 1321   MCH 25.2 (L) 10/22/2021 1450   MCHC 30.3 (L) 03/23/2023 1321   MCHC 33.1 10/22/2021 1450   RDW 22.7 (H) 03/23/2023 1321   RDW 21.1 (H) 04/04/2013 0834   LYMPHSABS 2.1 11/03/2016 1350   MONOABS 0.3 11/03/2016 1350   EOSABS 0.0 11/03/2016 1350   BASOSABS 0.0 11/03/2016 1350    Hgb A1C Lab Results  Component Value Date   HGBA1C 4.9 12/02/2022           Assessment & Plan:   Preventative health maintenance:  Flu shot declined Tetanus UTD Encouraged her to get her COVID-vaccine Pap smear UTD Mammogram UTD Encouraged her to consume a balanced diet and exercise regimen Advised her to see an eye doctor and dentist annually We will check CBC, c-Met, TSH, lipid, A1c today  RTC in 6 months for follow-up of chronic conditions Nicki Reaper, NP

## 2023-06-30 NOTE — Assessment & Plan Note (Signed)
TSH today Ultrasound from 11/2021 reviewed Referral to endocrinology placed

## 2023-06-30 NOTE — Patient Instructions (Signed)

## 2023-06-30 NOTE — Assessment & Plan Note (Signed)
Encouraged diet and exercise for weight loss ?

## 2023-07-01 LAB — COMPLETE METABOLIC PANEL WITH GFR
AG Ratio: 1.1 (calc) (ref 1.0–2.5)
ALT: 5 U/L — ABNORMAL LOW (ref 6–29)
AST: 12 U/L (ref 10–30)
Albumin: 4 g/dL (ref 3.6–5.1)
Alkaline phosphatase (APISO): 68 U/L (ref 31–125)
BUN/Creatinine Ratio: 8 (calc) (ref 6–22)
BUN: 6 mg/dL — ABNORMAL LOW (ref 7–25)
CO2: 24 mmol/L (ref 20–32)
Calcium: 8.8 mg/dL (ref 8.6–10.2)
Chloride: 108 mmol/L (ref 98–110)
Creat: 0.72 mg/dL (ref 0.50–0.99)
Globulin: 3.8 g/dL — ABNORMAL HIGH (ref 1.9–3.7)
Glucose, Bld: 92 mg/dL (ref 65–139)
Potassium: 3.6 mmol/L (ref 3.5–5.3)
Sodium: 140 mmol/L (ref 135–146)
Total Bilirubin: 0.7 mg/dL (ref 0.2–1.2)
Total Protein: 7.8 g/dL (ref 6.1–8.1)
eGFR: 107 mL/min/{1.73_m2} (ref >=60)

## 2023-07-01 LAB — LIPID PANEL
Cholesterol: 113 mg/dL (ref <200)
HDL: 37 mg/dL — ABNORMAL LOW (ref >=50)
LDL Cholesterol (Calc): 63 mg/dL
Non-HDL Cholesterol (Calc): 76 mg/dL (ref <130)
Total CHOL/HDL Ratio: 3.1 (calc) (ref <5.0)
Triglycerides: 57 mg/dL (ref <150)

## 2023-07-01 LAB — CBC
HCT: 25 % — ABNORMAL LOW (ref 35.0–45.0)
Hemoglobin: 7.7 g/dL — ABNORMAL LOW (ref 11.7–15.5)
MCH: 19.2 pg — ABNORMAL LOW (ref 27.0–33.0)
MCHC: 30.8 g/dL — ABNORMAL LOW (ref 32.0–36.0)
MCV: 62.3 fL — ABNORMAL LOW (ref 80.0–100.0)
Platelets: 224 10*3/uL (ref 140–400)
RBC: 4.01 10*6/uL (ref 3.80–5.10)
RDW: 23.4 % — ABNORMAL HIGH (ref 11.0–15.0)
WBC: 5.7 10*3/uL (ref 3.8–10.8)

## 2023-07-01 LAB — HEMOGLOBIN A1C
Hgb A1c MFr Bld: 5 %{Hb} (ref <5.7)
Mean Plasma Glucose: 97 mg/dL
eAG (mmol/L): 5.4 mmol/L

## 2023-07-01 LAB — IRON,TIBC AND FERRITIN PANEL
%SAT: 8 % — ABNORMAL LOW (ref 16–45)
Ferritin: 6 ng/mL — ABNORMAL LOW (ref 16–232)
Iron: 29 ug/dL — ABNORMAL LOW (ref 40–190)
TIBC: 383 ug/dL (ref 250–450)

## 2023-07-01 LAB — TSH: TSH: 1.99 m[IU]/L

## 2023-07-20 ENCOUNTER — Encounter: Payer: Self-pay | Admitting: Obstetrics & Gynecology

## 2023-07-20 ENCOUNTER — Ambulatory Visit: Payer: Federal, State, Local not specified - PPO | Admitting: Obstetrics & Gynecology

## 2023-07-20 VITALS — BP 156/88 | HR 99 | Wt 211.0 lb

## 2023-07-20 DIAGNOSIS — D219 Benign neoplasm of connective and other soft tissue, unspecified: Secondary | ICD-10-CM | POA: Diagnosis not present

## 2023-07-20 DIAGNOSIS — N920 Excessive and frequent menstruation with regular cycle: Secondary | ICD-10-CM | POA: Diagnosis not present

## 2023-07-20 DIAGNOSIS — D500 Iron deficiency anemia secondary to blood loss (chronic): Secondary | ICD-10-CM

## 2023-07-20 NOTE — Progress Notes (Signed)
GYNECOLOGY OFFICE VISIT NOTE  History:   Heidi Woods is a 42 y.o. [id number] here today for discussion about management of menorrhagia and fibroid seen on imaging done earlier this year.  She was noted to have a  pedunculated uterine fibroid measuring 4.5 x 3.3 cm extending from the right posterior uterus with a 16 mm stalk. She is still having heavy, monthly bleeding not  associated with pain. Also has increased bloating recently, she is wondering if the fibroid increased in size.  Has used Lysteda/Naproxen in past, this did not help.  Depo Provera helped for several years in the past, she is considering doing this again.  Wants to discuss other management options.  She denies any current  abnormal vaginal discharge, bleeding, pelvic pain or other concerns.    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 Heterozygotes are clinically asymptomatic with no hematologic abnormalities, while those homozygous for HbO-Arab may have a mild, asymptomatic, compensated hemolytic anemia.  Hemoglobin O-Arab is of clinical    LGSIL (low grade squamous intraepithelial lesion) on Pap smear 04/05/2013   Also had abnormal pap smear at age 13-22, had normal colpo and paps until 2014   Trichomonal vaginitis 06/22/2020    Past Surgical History:  Procedure Laterality Date   CHOLECYSTECTOMY  2012   COLONOSCOPY WITH PROPOFOL N/A 02/03/2023   Procedure: COLONOSCOPY WITH PROPOFOL;  Surgeon: Wyline Mood, MD;  Location: [facility] ENDOSCOPY;  Service: Gastroenterology;  Laterality: N/A;   ESOPHAGOGASTRODUODENOSCOPY (EGD) WITH PROPOFOL N/A 02/03/2023   Procedure:  ESOPHAGOGASTRODUODENOSCOPY (EGD) WITH PROPOFOL;  Surgeon: Wyline Mood, MD;  Location: [facility] ENDOSCOPY;  Service: Gastroenterology;  Laterality: N/A;   GIVENS CAPSULE STUDY N/A 05/19/2023   Procedure: GIVENS CAPSULE STUDY;  Surgeon: Wyline Mood, MD;  Location: [facility] ENDOSCOPY;  Service: Gastroenterology;  Laterality: N/A;   HERNIA REPAIR     INDUCED ABORTION     x 7   UMBILICAL HERNIA REPAIR      The following portions of the patient's history were reviewed and updated as appropriate: allergies, current medications, past family history, past medical history, past social history, past surgical history and problem list.   Health Maintenance:  Normal pap and negative HRHPV on 12/02/2022.  Normal mammogram on 01/20/2023.   Review of Systems:  Pertinent items noted in HPI and remainder of comprehensive ROS otherwise negative.  Physical Exam:  BP (!) 156/88   Pulse 99   Wt 211 lb (95.7 kg)   LMP 07/20/2023 (Exact Date)   BMI 31.62 kg/m  CONSTITUTIONAL: Well-developed, well-nourished female in no acute distress.  HEENT:  Normocephalic, atraumatic. External right and left ear normal. No scleral icterus.  NECK: Normal range of motion, supple, no masses noted on observation SKIN: No rash noted. Not diaphoretic. No erythema. No pallor. MUSCULOSKELETAL: Normal range of motion. No edema noted. NEUROLOGIC: Alert and oriented to person, place, and time. Normal muscle tone coordination. No cranial nerve deficit noted. PSYCHIATRIC: Normal mood and affect. Normal behavior. Normal judgment and thought content. CARDIOVASCULAR: Normal heart rate noted RESPIRATORY: Effort and breath sounds normal, no problems with respiration noted ABDOMEN: No masses noted. No other overt distention  noted.   PELVIC: Deferred  Labs and Imaging    Latest Ref Rng & Units 06/30/2023    8:24 AM 03/23/2023    1:21 PM 01/13/2023    3:47 PM  CBC  WBC 3.8 - 10.8 Thousand/uL 5.7  5.9  5.1   Hemoglobin 11.7 - 15.5 g/dL 7.7  7.9  7.6    Hematocrit 35.0 - 45.0 % 25.0  26.1  24.3   Platelets 140 - 400 Thousand/uL 224  236  226    Iron/TIBC/Ferritin/ %Sat    Component Value Date/Time   IRON 29 (L) 06/30/2023 0824   IRON 27 03/23/2023 1321   TIBC 383 06/30/2023 0824   TIBC 406 03/23/2023 1321   FERRITIN 6 (L) 06/30/2023 0824   FERRITIN 10 (L) 03/23/2023 1321   IRONPCTSAT 8 (L) 06/30/2023 0824    No results found.    Assessment and Plan:     1. Iron deficiency anemia due to chronic blood loss Counseled about iron replacement given recent labs, orders placed via Infusion navigator. This is for Venofer 300 mcg weekly x 3 doses.  2. Fibroids 3. Menorrhagia with regular cycle Had a long discussion about management of her menorrhagia.  Discussed oral progesterone, Depo Provera, Levonogestrel IUD, endometrial ablation or hysterectomy as definitive surgical management.  Discussed risks and benefits of each method.   Patient is undecided for now, she will let us know.  Bleeding precautions reviewed.  Repeat ultrasound ordered, will follow up results and manage accordingly. - US PELVIC COMPLETE WITH TRANSVAGINAL; Future  Routine preventative health maintenance measures emphasized. Please refer to After Visit Summary for other counseling recommendations.   Return for follow up as recommended.    I spent 25 minutes dedicated to the care of this patient including pre-visit review of records, face to face time with the patient discussing her conditions and treatments and post visit orders.    Jaynie Collins, MD, FACOG Obstetrician & Gynecologist, [facility] for [employer], [facility]

## 2023-07-20 NOTE — Patient Instructions (Signed)
Do research about IUD and heavy periods  Depo Provera  Oral progesterone  Endometrial Ablation  Hysterectomy

## 2023-07-21 ENCOUNTER — Encounter: Payer: Self-pay | Admitting: Obstetrics & Gynecology

## 2023-07-22 ENCOUNTER — Telehealth: Payer: Self-pay | Admitting: Pharmacy Technician

## 2023-07-22 ENCOUNTER — Other Ambulatory Visit: Payer: Self-pay

## 2023-07-22 NOTE — Telephone Encounter (Signed)
Dr. Macon Large, [place] Child[place] note:  Patient will be scheduled as soon as possible.  Auth Submission: NO AUTH NEEDED Site of care: Site of care: [facility] Payer: [employer] [place] Medication & CPT/J Code(s) submitted: Venofer (Iron Sucrose) J1756 Route of submission (phone, fax, portal):  Phone # Fax # Auth type: Buy/Bill PB Units/visits requested: 5 Reference number:  Approval from: 07/22/23 to 09/15/23

## 2023-07-27 ENCOUNTER — Ambulatory Visit
Admission: RE | Admit: 2023-07-27 | Discharge: 2023-07-27 | Disposition: A | Discharge status: Home / Self Care | Payer: Federal, State, Local not specified - PPO | Source: Ambulatory Visit | Attending: Obstetrics & Gynecology | Admitting: Obstetrics & Gynecology

## 2023-07-27 DIAGNOSIS — D219 Benign neoplasm of connective and other soft tissue, unspecified: Secondary | ICD-10-CM | POA: Insufficient documentation

## 2023-07-27 DIAGNOSIS — N920 Excessive and frequent menstruation with regular cycle: Secondary | ICD-10-CM | POA: Insufficient documentation

## 2023-07-27 DIAGNOSIS — D252 Subserosal leiomyoma of uterus: Secondary | ICD-10-CM | POA: Diagnosis not present

## 2024-04-15 IMAGING — MG MM DIGITAL SCREENING BILAT W/ TOMO AND CAD
8 series · 8 of 24 positions shown · non-contrast
Comparison: None.

CLINICAL DATA: Screening.

EXAM:
DIGITAL SCREENING BILATERAL MAMMOGRAM WITH TOMOSYNTHESIS AND CAD
TECHNIQUE: Bilateral screening digital craniocaudal and mediolateral oblique
mammograms were obtained. Bilateral screening digital breast
tomosynthesis was performed. The images were evaluated with
computer-aided detection.

[L MLO synth-2D]
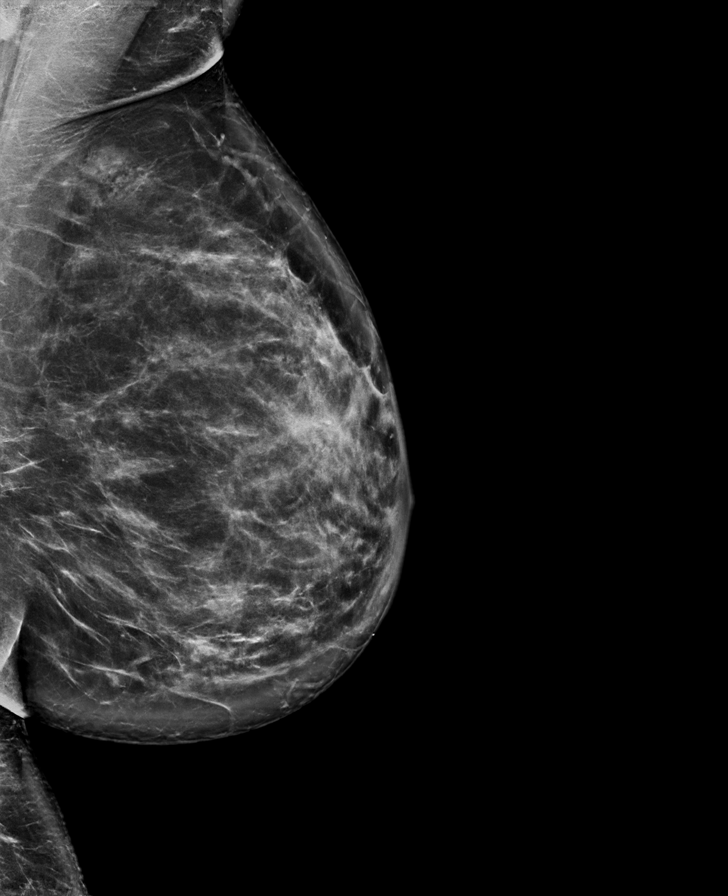

[R CC synth-2D]
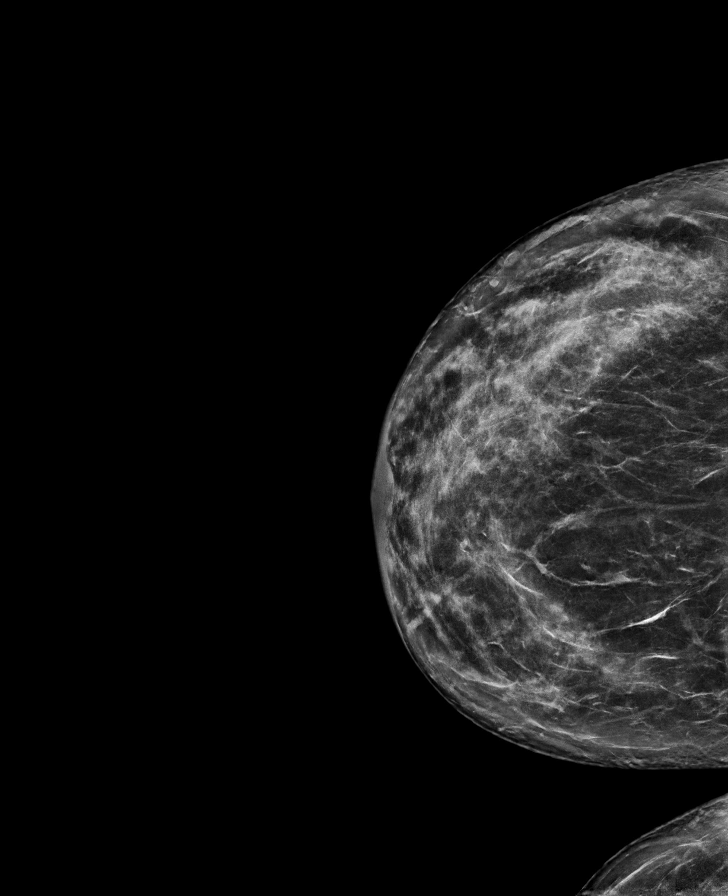

[R MLO synth-2D]
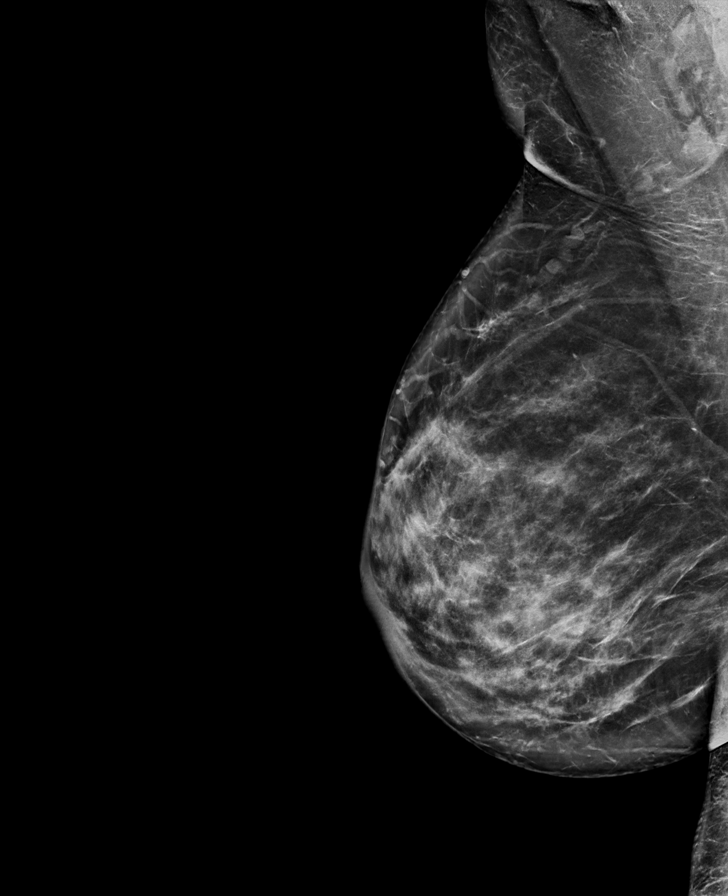

[L CC synth-2D]
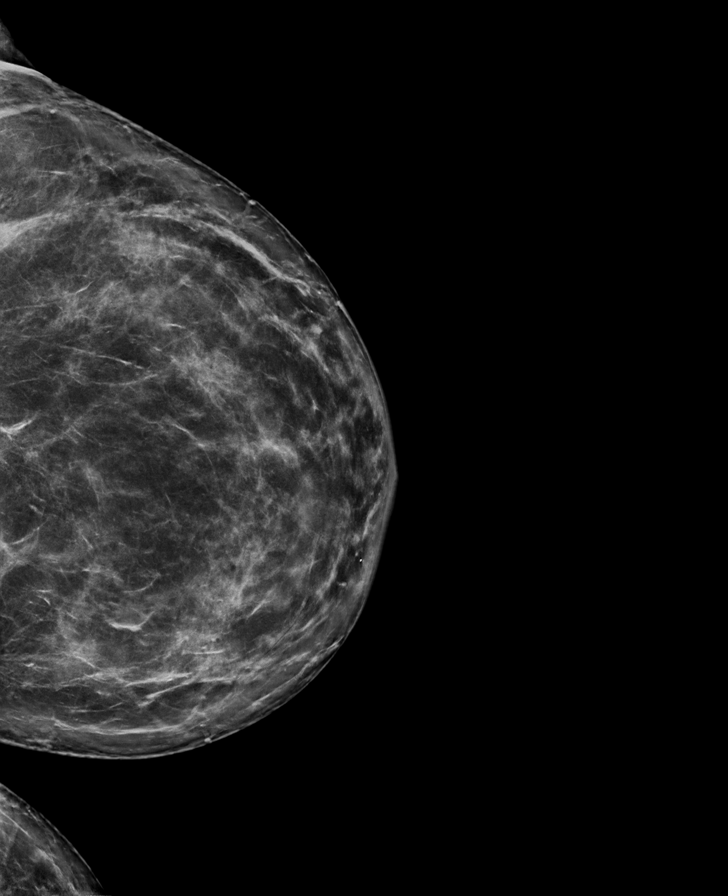

[L CC tomo · tomo slice 49/98.0]
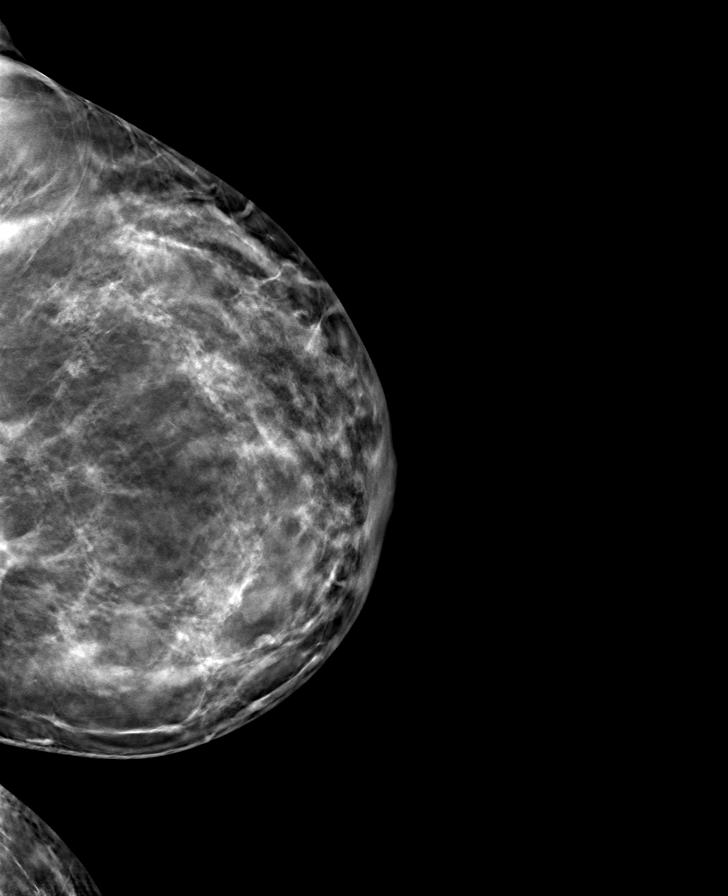

[R MLO tomo · tomo slice 49/97.0]
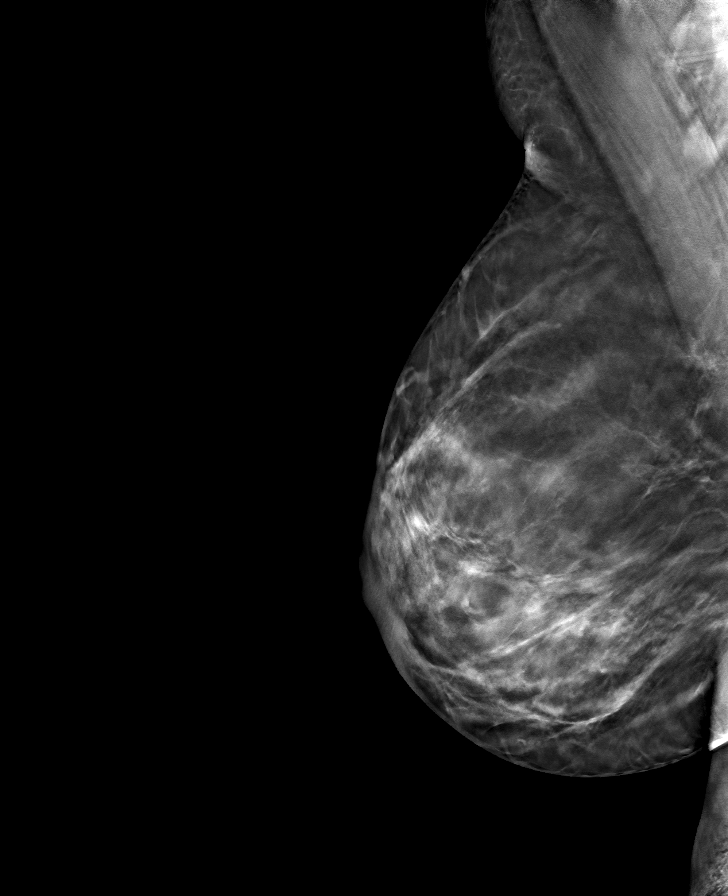

[L MLO tomo · tomo slice 52/103.0]
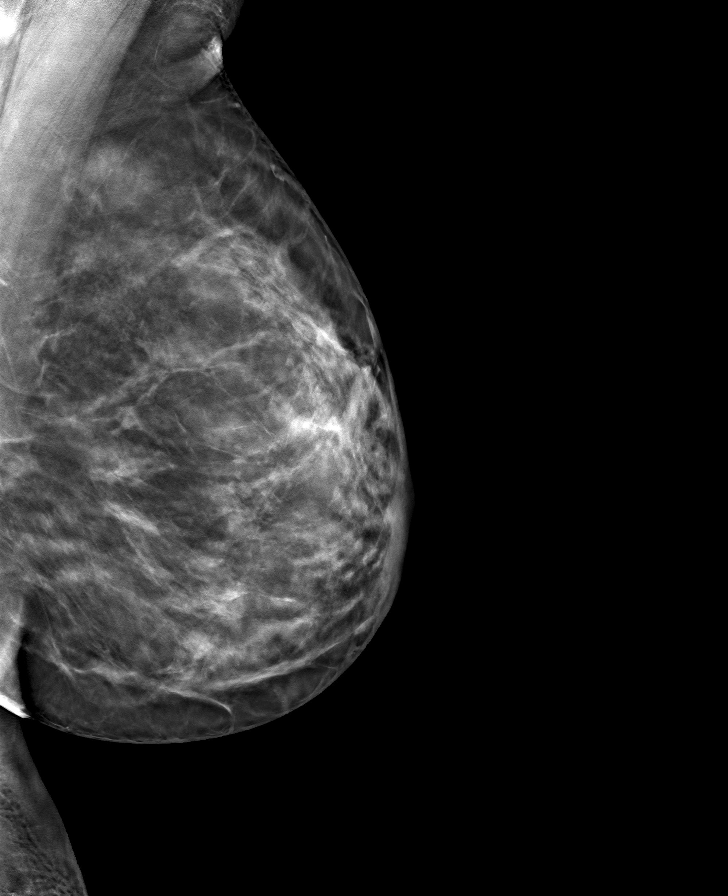

[R CC tomo · tomo slice 45/90.0]
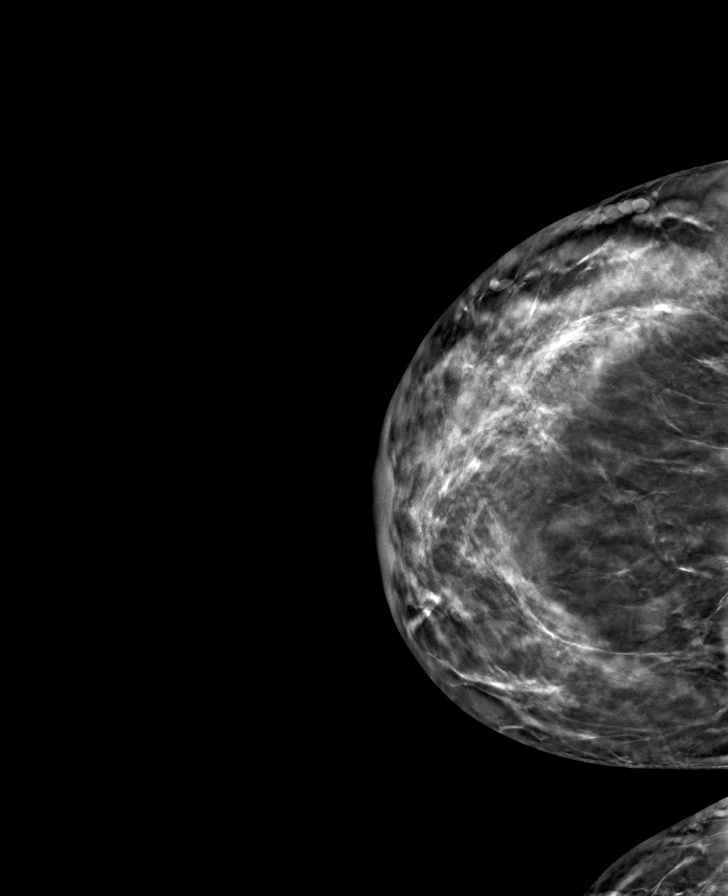

[8 of 24 positions shown; findings below may reference images not displayed]

ACR Breast Density Category c: The breast tissue is heterogeneously
dense, which may obscure small masses
FINDINGS: There are no findings suspicious for malignancy.
IMPRESSION: No mammographic evidence of malignancy. A result letter of this
screening mammogram will be mailed directly to the patient.

RECOMMENDATION:
Screening mammogram in one year. (Code:C8-T-HNK)

BI-RADS CATEGORY  1: Negative.

## 2024-05-04 ENCOUNTER — Encounter: Payer: Self-pay | Admitting: Oncology

## 2024-05-23 ENCOUNTER — Encounter: Payer: Self-pay | Admitting: Obstetrics & Gynecology

## 2024-05-24 ENCOUNTER — Ambulatory Visit

## 2024-05-24 VITALS — BP 145/86 | HR 91 | Ht 67.91 in | Wt 190.4 lb

## 2024-05-24 DIAGNOSIS — Z3042 Encounter for surveillance of injectable contraceptive: Secondary | ICD-10-CM | POA: Diagnosis not present

## 2024-05-24 DIAGNOSIS — Z3202 Encounter for pregnancy test, result negative: Secondary | ICD-10-CM

## 2024-05-24 DIAGNOSIS — Z30013 Encounter for initial prescription of injectable contraceptive: Secondary | ICD-10-CM

## 2024-05-24 DIAGNOSIS — Z3201 Encounter for pregnancy test, result positive: Secondary | ICD-10-CM

## 2024-05-24 LAB — POCT URINE PREGNANCY: Preg Test, Ur: NEGATIVE

## 2024-05-24 MED ORDER — MEDROXYPROGESTERONE ACETATE 150 MG/ML IM SUSP
150.0000 mg | Freq: Once | INTRAMUSCULAR | Status: AC
  Administered 2024-05-24: 150 mg via INTRAMUSCULAR

## 2024-05-24 NOTE — Progress Notes (Signed)
 Date last pap: 12/02/2022. Last Depo-Provera : 2 years ago. Side Effects if any: na . Serum HCG indicated? negative. Depo-Provera  150 mg IM given by: Shawnee Fleet, CMA . Next appointment due 08/09/2024-08/23/2024.  Next annual 06/21/2024.  Depo restart and UPT/   Shawnee Fleet, CMA

## 2024-06-14 ENCOUNTER — Encounter: Payer: Self-pay | Admitting: Oncology

## 2024-06-21 ENCOUNTER — Encounter: Payer: Self-pay | Admitting: Obstetrics & Gynecology

## 2024-06-21 ENCOUNTER — Other Ambulatory Visit (HOSPITAL_COMMUNITY)
Admission: RE | Admit: 2024-06-21 | Discharge: 2024-06-21 | Disposition: A | Discharge status: Home / Self Care | Source: Ambulatory Visit | Attending: Obstetrics & Gynecology | Admitting: Obstetrics & Gynecology

## 2024-06-21 ENCOUNTER — Ambulatory Visit: Admitting: Obstetrics & Gynecology

## 2024-06-21 VITALS — BP 137/85 | HR 94 | Ht 68.0 in | Wt 187.0 lb

## 2024-06-21 DIAGNOSIS — Z01419 Encounter for gynecological examination (general) (routine) without abnormal findings: Secondary | ICD-10-CM

## 2024-06-21 DIAGNOSIS — Z113 Encounter for screening for infections with a predominantly sexual mode of transmission: Secondary | ICD-10-CM | POA: Insufficient documentation

## 2024-06-21 DIAGNOSIS — Z1231 Encounter for screening mammogram for malignant neoplasm of breast: Secondary | ICD-10-CM

## 2024-06-21 NOTE — Patient Instructions (Signed)
 Mammogram at [facility]

## 2024-06-21 NOTE — Progress Notes (Signed)
 GYNECOLOGY ANNUAL PREVENTATIVE CARE ENCOUNTER NOTE  History:    Heidi Woods is a 43 y.o. [id number] female here for a routine annual gynecologic exam.  Current complaints: none.   Denies abnormal vaginal bleeding, discharge, pelvic pain, problems with intercourse or other gynecologic concerns.  Gynecologic History Patient's last menstrual period was 06/13/2024. Contraception: Depo-Provera  injections Last Pap: 12/02/2022. Result was normal with negative HPV Last Mammogram: 01/20/2023.  Result was normal  Obstetric History OB History  Gravida Para Term Preterm AB Living  13 4 4  0 9 4  SAB IAB Ectopic Multiple Live Births  2 7 0 0 2    # Outcome Date GA Lbr Len/2nd Weight Sex Type Anes PTL Lv  13 Term 07/22/16 [redacted]w[redacted]d 07:44 / 00:22 6 lb 11.2 oz (3.04 kg) M Vag-Spont None  LIV  12 Term 09/24/13 [redacted]w[redacted]d 06:00 / 02:34 8 lb 9.6 oz (3.9 kg) M Vag-Spont EPI  LIV  11 Term 2007   6 lb 2 oz (2.778 kg) M Vag-Spont     10 Term 2004   7 lb 6 oz (3.345 kg) F Vag-Spont     9 IAB           8 IAB           7 IAB           6 IAB           5 IAB           4 IAB           3 IAB           2 SAB           1 SAB             Past Medical History:  Diagnosis Date   Anemia    Goiter    Hemoglobin C trait 09/24/2013   COMPOUND HETEROZYGOUS FOR HEMOGLOBIN C AND HEMOGLOBIN O ARAB   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 Heterozygotes are clinically asymptomatic with no hematologic abnormalities, while those homozygous for HbO-Arab may have a mild, asymptomatic, compensated hemolytic anemia.  Hemoglobin O-Arab is of clinical    LGSIL (low grade squamous intraepithelial lesion) on Pap smear 04/05/2013   Also had abnormal pap smear at age 31-22, had normal colpo and paps until 2014   Trichomonal vaginitis 06/22/2020     Past Surgical History:  Procedure Laterality Date   CHOLECYSTECTOMY  2012   COLONOSCOPY WITH PROPOFOL  N/A 02/03/2023   Procedure: COLONOSCOPY WITH PROPOFOL ;  Surgeon: Therisa Bi, MD;  Location: [facility] ENDOSCOPY;  Service: Gastroenterology;  Laterality: N/A;   ESOPHAGOGASTRODUODENOSCOPY (EGD) WITH PROPOFOL  N/A 02/03/2023   Procedure: ESOPHAGOGASTRODUODENOSCOPY (EGD) WITH PROPOFOL ;  Surgeon: Therisa Bi, MD;  Location: [facility] ENDOSCOPY;  Service: Gastroenterology;  Laterality: N/A;   GIVENS CAPSULE STUDY N/A 05/19/2023   Procedure: GIVENS CAPSULE STUDY;  Surgeon: Therisa Bi, MD;  Location: [facility] ENDOSCOPY;  Service: Gastroenterology;  Laterality: N/A;   HERNIA REPAIR     INDUCED ABORTION     x 7   UMBILICAL HERNIA REPAIR      Current Outpatient Medications on File Prior to Visit  Medication Sig Dispense Refill   albuterol  (VENTOLIN  HFA) 108 (90 Base) MCG/ACT inhaler Inhale 2 puffs into the lungs every 6 (six) hours as needed  for wheezing or shortness of breath. 8 g 0   Cholecalciferol  (VITAMIN D3) 1.25 MG (50000 UT) CAPS TAKE 1 CAPSULE BY MOUTH ONCE A WEEK 12 capsule 1   cyanocobalamin  (VITAMIN B12) 500 MCG tablet Take 500 mcg by mouth daily.     ferrous sulfate  324 MG TBEC Take 324 mg by mouth daily.     medroxyPROGESTERone  (DEPO-PROVERA ) 150 MG/ML injection Inject 150 mg into the muscle every 3 (three) months.     No current facility-administered medications on file prior to visit.    No Known Allergies  Social History:  reports that she has never smoked. She has never used smokeless tobacco. She reports that she does not drink alcohol and does not use drugs.  Family History  Problem Relation Age of Onset   Migraines Mother    Sickle cell anemia Father    Prostate cancer Father    Throat cancer Maternal Uncle    Breast cancer Maternal Grandmother    Hypertension Maternal Grandmother    Cancer Maternal Grandmother        colon   Prostate cancer Maternal Grandfather     Hypertension Maternal Grandfather    Cancer Paternal Grandfather        throat, lung    The following portions of the patient's history were reviewed and updated as appropriate: allergies, current medications, past family history, past medical history, past social history, past surgical history and problem list.  Review of Systems Pertinent items noted in HPI and remainder of comprehensive ROS otherwise negative.  Physical Exam:  BP 137/85   Pulse 94   Ht 5' 8 (1.727 m)   Wt 187 lb (84.8 kg)   LMP 06/13/2024   BMI 28.43 kg/m  CONSTITUTIONAL: Well-developed, well-nourished female in no acute distress.  HENT:  Normocephalic, atraumatic, External right and left ear normal.  EYES: Conjunctivae and EOM are normal. Pupils are equal, round, and reactive to light. No scleral icterus.  NECK: Normal range of motion, supple, no masses observed. SKIN: Skin is warm and dry. No rash noted. Not diaphoretic. No erythema. No pallor. MUSCULOSKELETAL: Normal range of motion. No tenderness.  No cyanosis, clubbing, or edema. NEUROLOGIC: Alert and oriented to person, place, and time. Normal muscle tone coordination.  PSYCHIATRIC: Normal mood and affect. Normal behavior. Normal judgment and thought content. CARDIOVASCULAR: Normal heart rate noted, regular rhythm RESPIRATORY: Clear to auscultation bilaterally. Effort and breath sounds normal, no problems with respiration noted. BREASTS: Symmetric in size. No masses, tenderness, skin changes, nipple drainage, or lymphadenopathy bilaterally. Performed in the presence of a chaperone. ABDOMEN: Soft, no distention noted.  No tenderness, rebound or guarding.  PELVIC: Normal appearing external genitalia and urethral meatus; normal appearing distal vaginal mucosa.  No abnormal vaginal discharge noted. Bimanual exam done, enlarged uterus with posterior fibroid palpated,  normal adnexa, no other palpable masses, no uterine or adnexal tenderness.  Performed in the  presence of a chaperone.  Assessment and Plan:     1. Routine screening for STI (sexually transmitted infection) STI screen desired by patient, will follow up results and manage accordingly. - Cervicovaginal ancillary only - RPR+HBsAg+HCVAb+HIV  2. Breast cancer screening by mammogram Mammogram scheduled for breast cancer screening. - MM 3D SCREENING MAMMOGRAM BILATERAL BREAST; Future  3. Well woman exam with routine gynecological exam (Primary) Pap smear is up to date. Routine preventative health maintenance measures emphasized. Please refer to After Visit Summary for other counseling recommendations.      GLORIS HUGGER, MD, FACOG  Obstetrician & Gynecologist, Biochemist, clinical for [employer], [facility]

## 2024-06-22 ENCOUNTER — Ambulatory Visit: Payer: Self-pay | Admitting: Obstetrics & Gynecology

## 2024-06-22 LAB — RPR+HBSAG+HCVAB+...
HIV Screen 4th Generation wRfx: NONREACTIVE
Hep C Virus Ab: NONREACTIVE
Hepatitis B Surface Ag: NEGATIVE
RPR Ser Ql: NONREACTIVE

## 2024-06-23 LAB — CERVICOVAGINAL ANCILLARY ONLY
Chlamydia: NEGATIVE
Comment: NEGATIVE
Comment: NEGATIVE
Comment: NORMAL
Neisseria Gonorrhea: NEGATIVE
Trichomonas: NEGATIVE

## 2024-07-04 ENCOUNTER — Encounter: Payer: Self-pay | Admitting: Internal Medicine

## 2024-07-25 ENCOUNTER — Encounter

## 2024-08-10 ENCOUNTER — Ambulatory Visit: Admitting: *Deleted

## 2024-08-10 DIAGNOSIS — Z3042 Encounter for surveillance of injectable contraceptive: Secondary | ICD-10-CM

## 2024-08-10 MED ORDER — MEDROXYPROGESTERONE ACETATE 150 MG/ML IM SUSY
150.0000 mg | PREFILLED_SYRINGE | Freq: Once | INTRAMUSCULAR | Status: AC
  Administered 2024-08-10: 150 mg via INTRAMUSCULAR

## 2024-08-10 MED ORDER — MEDROXYPROGESTERONE ACETATE 150 MG/ML IM SUSP: 150.0000 mg | INTRAMUSCULAR | 11 refills | Status: AC

## 2024-08-10 NOTE — Progress Notes (Signed)
 Date last pap: 12/02/22. Last Depo-Provera : 05/24/24. Side Effects if any: N/A. Serum HCG indicated? N/A. Depo-Provera  150 mg IM given by: Gordan DEL, CMA. Next appointment due 10/26/24.

## 2024-10-28 ENCOUNTER — Ambulatory Visit
